# Patient Record
Sex: Female | Born: 1965
Health system: Southern US, Community
[De-identification: ages and names within clinical notes are randomized; demographics above are authoritative.]

## PROBLEM LIST (undated history)

## (undated) DIAGNOSIS — K219 Gastro-esophageal reflux disease without esophagitis: Secondary | ICD-10-CM

## (undated) DIAGNOSIS — R12 Heartburn: Secondary | ICD-10-CM

## (undated) DIAGNOSIS — K589 Irritable bowel syndrome without diarrhea: Secondary | ICD-10-CM

## (undated) DIAGNOSIS — I10 Essential (primary) hypertension: Secondary | ICD-10-CM

## (undated) DIAGNOSIS — Z9889 Other specified postprocedural states: Secondary | ICD-10-CM

## (undated) DIAGNOSIS — R112 Nausea with vomiting, unspecified: Secondary | ICD-10-CM

## (undated) DIAGNOSIS — R7303 Prediabetes: Secondary | ICD-10-CM

## (undated) DIAGNOSIS — E785 Hyperlipidemia, unspecified: Secondary | ICD-10-CM

## (undated) DIAGNOSIS — L719 Rosacea, unspecified: Secondary | ICD-10-CM

## (undated) DIAGNOSIS — M069 Rheumatoid arthritis, unspecified: Secondary | ICD-10-CM

## (undated) DIAGNOSIS — K834 Spasm of sphincter of Oddi: Secondary | ICD-10-CM

## (undated) DIAGNOSIS — M3500 Sicca syndrome, unspecified: Secondary | ICD-10-CM

## (undated) HISTORY — DX: Sjogren syndrome, unspecified: M35.00

## (undated) HISTORY — DX: Rosacea, unspecified: L71.9

## (undated) HISTORY — PX: KNEE ARTHROSCOPY: SHX127

## (undated) HISTORY — DX: Essential (primary) hypertension: I10

## (undated) HISTORY — DX: Hyperlipidemia, unspecified: E78.5

## (undated) HISTORY — DX: Prediabetes: R73.03

## (undated) HISTORY — DX: Irritable bowel syndrome, unspecified: K58.9

## (undated) HISTORY — DX: Gastro-esophageal reflux disease without esophagitis: K21.9

---

## 1993-09-27 HISTORY — PX: CHOLECYSTECTOMY: SHX55

## 2004-05-27 ENCOUNTER — Emergency Department (HOSPITAL_COMMUNITY): Admission: EM | Admit: 2004-05-27 | Discharge: 2004-05-28 | Payer: Self-pay | Admitting: Emergency Medicine

## 2011-09-07 ENCOUNTER — Ambulatory Visit: Payer: BC Managed Care – PPO | Attending: Orthopedic Surgery | Admitting: Physical Therapy

## 2011-09-07 DIAGNOSIS — IMO0001 Reserved for inherently not codable concepts without codable children: Secondary | ICD-10-CM | POA: Insufficient documentation

## 2011-09-07 DIAGNOSIS — M6281 Muscle weakness (generalized): Secondary | ICD-10-CM | POA: Insufficient documentation

## 2011-09-07 DIAGNOSIS — M25579 Pain in unspecified ankle and joints of unspecified foot: Secondary | ICD-10-CM | POA: Insufficient documentation

## 2011-09-07 DIAGNOSIS — R269 Unspecified abnormalities of gait and mobility: Secondary | ICD-10-CM | POA: Insufficient documentation

## 2011-09-09 ENCOUNTER — Ambulatory Visit: Payer: BC Managed Care – PPO | Admitting: Physical Therapy

## 2011-09-13 ENCOUNTER — Ambulatory Visit: Payer: BC Managed Care – PPO | Admitting: Physical Therapy

## 2011-09-15 ENCOUNTER — Ambulatory Visit: Payer: BC Managed Care – PPO | Admitting: Physical Therapy

## 2011-09-17 ENCOUNTER — Ambulatory Visit: Payer: BC Managed Care – PPO | Admitting: Physical Therapy

## 2012-05-25 HISTORY — PX: ABDOMINOPLASTY: SUR9

## 2012-08-13 ENCOUNTER — Emergency Department (INDEPENDENT_AMBULATORY_CARE_PROVIDER_SITE_OTHER): Payer: BC Managed Care – PPO

## 2012-08-13 ENCOUNTER — Emergency Department
Admission: EM | Admit: 2012-08-13 | Discharge: 2012-08-13 | Disposition: A | Discharge status: Home / Self Care | Payer: BC Managed Care – PPO | Source: Home / Self Care | Attending: Emergency Medicine | Admitting: Emergency Medicine

## 2012-08-13 ENCOUNTER — Encounter: Payer: Self-pay | Admitting: *Deleted

## 2012-08-13 DIAGNOSIS — R05 Cough: Secondary | ICD-10-CM

## 2012-08-13 DIAGNOSIS — R0989 Other specified symptoms and signs involving the circulatory and respiratory systems: Secondary | ICD-10-CM

## 2012-08-13 DIAGNOSIS — R059 Cough, unspecified: Secondary | ICD-10-CM

## 2012-08-13 DIAGNOSIS — J209 Acute bronchitis, unspecified: Secondary | ICD-10-CM

## 2012-08-13 HISTORY — DX: Heartburn: R12

## 2012-08-13 LAB — POCT INFLUENZA A/B
Influenza A, POC: NEGATIVE
Influenza B, POC: NEGATIVE

## 2012-08-13 MED ORDER — PROMETHAZINE-CODEINE 6.25-10 MG/5ML PO SYRP: ORAL_SOLUTION | ORAL | Status: DC

## 2012-08-13 MED ORDER — AZITHROMYCIN 250 MG PO TABS: ORAL_TABLET | ORAL | Status: DC

## 2012-08-13 NOTE — ED Notes (Signed)
Patient c/o chest congestion, cough, fever, chills, body ache, HA, wheezing, chest pain, and tired with exertion since Friday. Has tried OTC Nyquil.

## 2012-08-13 NOTE — ED Provider Notes (Signed)
History     CSN: [id number]  Arrival date & time 08/13/12  1550   First MD Initiated Contact with Patient 08/13/12 1552      Chief Complaint  Patient presents with  . Nasal Congestion    chest  . Cough    (Consider location/radiation/quality/duration/timing/severity/associated sxs/prior treatment) Patient is a 46 y.o. female presenting with cough. The history is provided by the patient and the spouse.  Cough This is a new problem. The current episode started 2 days ago. The problem occurs every few minutes. The problem has been gradually worsening. The cough is productive of purulent sputum. The maximum temperature recorded prior to her arrival was 100 to 100.9 F. The fever has been present for 1 to 2 days. Associated symptoms include chest pain (Mildly pleuritic right and left lateral chest discomfort. No exertional chest pain), chills, headaches (Mild), rhinorrhea (had clear rhinorrhea a few days ago but that resolved), myalgias and wheezing (Minimal). Pertinent negatives include no sweats, no weight loss, no ear congestion, no ear pain, no sore throat and no shortness of breath. She has tried nothing for the symptoms. The treatment provided no relief. Risk factors: Works night shift as Charity fundraiser. She is not a smoker. Her past medical history is significant for pneumonia (Had pneumonia once about 3 years ago that resolved with antibiotics). Her past medical history does not include bronchitis, bronchiectasis, COPD, emphysema or asthma.    Past Medical History  Diagnosis Date  . Heartburn     Past Surgical History  Procedure Date  . Cesarean section   . Cholecystectomy   . Abdominoplasty   . Knee arthroscopy     Family History  Problem Relation Age of Onset  . Hypertension Mother   . Hypertension Father     History  Substance Use Topics  . Smoking status: Never Smoker   . Smokeless tobacco: Not on file  . Alcohol Use: No    OB History    Grav Para Term Preterm Abortions  TAB SAB Ect Mult Living                  Review of Systems  Constitutional: Positive for chills and fatigue. Negative for weight loss.  HENT: Positive for rhinorrhea (had clear rhinorrhea a few days ago but that resolved). Negative for ear pain and sore throat.   Respiratory: Positive for cough and wheezing (Minimal). Negative for chest tightness and shortness of breath.   Cardiovascular: Positive for chest pain (Mildly pleuritic right and left lateral chest discomfort. No exertional chest pain). Negative for palpitations and leg swelling.  Gastrointestinal: Negative.   Musculoskeletal: Positive for myalgias. Negative for arthralgias.  Skin: Negative for rash.  Neurological: Positive for headaches (Mild). Negative for syncope and light-headedness.  Psychiatric/Behavioral: Negative for hallucinations and confusion.  All other systems reviewed and are negative.    Allergies  Review of patient's allergies indicates no known allergies.  Home Medications   Current Outpatient Rx  Name  Route  Sig  Dispense  Refill  . CITALOPRAM HYDROBROMIDE 20 MG PO TABS   Oral   Take 20 mg by mouth daily.         Marland Kitchen ESOMEPRAZOLE MAGNESIUM 40 MG PO CPDR   Oral   Take 40 mg by mouth daily before breakfast.         . AZITHROMYCIN 250 MG PO TABS      Use as directed   1 each   0   . PROMETHAZINE-CODEINE 6.25-10 MG/5ML  PO SYRP      Take 1-2 teaspoons every 4-6 hours as needed for cough. May cause drowsiness.   120 mL   0     BP 132/83  Pulse 83  Temp 98.9 F (37.2 C) (Oral)  Resp 20  Ht 5' (1.524 m)  Wt 180 lb 4 oz (81.761 kg)  BMI 35.20 kg/m2  SpO2 95%  Physical Exam  Nursing note and vitals reviewed. Constitutional: She is oriented to person, place, and time. She appears well-developed and well-nourished. No distress.       Alert and cooperative, but appears very fatigued. Hacking cough noted.  HENT:  Head: Normocephalic and atraumatic.  Right Ear: Tympanic membrane normal.    Left Ear: Tympanic membrane normal.  Nose: Nose normal.  Mouth/Throat: Oropharynx is clear and moist. No oropharyngeal exudate.  Eyes: Right eye exhibits no discharge. Left eye exhibits no discharge. No scleral icterus.  Neck: Neck supple.  Cardiovascular: Normal rate, regular rhythm and normal heart sounds.   No murmur heard. Pulmonary/Chest: No respiratory distress. She has no decreased breath sounds. She has no wheezes. She has rhonchi in the right upper field, the right middle field, the left upper field and the left middle field. She has no rales.  Lymphadenopathy:    She has no cervical adenopathy.  Neurological: She is alert and oriented to person, place, and time.  Skin: Skin is warm and dry.   no rash or lesions .  ED Course  Procedures (including critical care time) Chest x-ray ordered at 4:30 PM  Labs Reviewed  POCT INFLUENZA A/B   Dg Chest 2 View  08/13/2012  *RADIOLOGY REPORT*  Clinical Data: Congestion.  Cough.  CHEST - 2 VIEW  Comparison: None.  Findings:  Cardiopericardial silhouette within normal limits. Mediastinal contours normal. Trachea midline.  No airspace disease or effusion. Cholecystectomy clips are present in the right upper quadrant.  IMPRESSION: Negative two-view chest.   Original Report Authenticated By: Andreas Newport, M.D.      1. Bronchitis, acute       MDM  Reviewed the chest x-ray within normal limits.-Diagnosis acute bronchitis. Reviewed flu tests negative.  Treat with Zithromax, Phenergan with codeine cough syrup. Rest and plenty of fluids and other symptomatic care discussed. Questions invited and answered. Followup with PCP if no better in one week, sooner if worse or new symptoms. Work excuse provided. Patient and husband agree with above plans.        Lajean Manes, MD 08/13/12 [phone]

## 2013-05-22 ENCOUNTER — Encounter (HOSPITAL_BASED_OUTPATIENT_CLINIC_OR_DEPARTMENT_OTHER)
Admission: RE | Disposition: A | Discharge status: Home / Self Care | Payer: Self-pay | Source: Ambulatory Visit | Attending: Orthopedic Surgery

## 2013-05-22 ENCOUNTER — Encounter (HOSPITAL_BASED_OUTPATIENT_CLINIC_OR_DEPARTMENT_OTHER): Payer: Self-pay | Admitting: Orthopedic Surgery

## 2013-05-22 ENCOUNTER — Ambulatory Visit (HOSPITAL_BASED_OUTPATIENT_CLINIC_OR_DEPARTMENT_OTHER)
Admission: RE | Admit: 2013-05-22 | Discharge: 2013-05-22 | Disposition: A | Discharge status: Home / Self Care | Payer: BC Managed Care – PPO | Source: Ambulatory Visit | Attending: Orthopedic Surgery | Admitting: Orthopedic Surgery

## 2013-05-22 DIAGNOSIS — S61209A Unspecified open wound of unspecified finger without damage to nail, initial encounter: Secondary | ICD-10-CM | POA: Insufficient documentation

## 2013-05-22 DIAGNOSIS — Y92009 Unspecified place in unspecified non-institutional (private) residence as the place of occurrence of the external cause: Secondary | ICD-10-CM | POA: Insufficient documentation

## 2013-05-22 DIAGNOSIS — X58XXXA Exposure to other specified factors, initial encounter: Secondary | ICD-10-CM | POA: Insufficient documentation

## 2013-05-22 DIAGNOSIS — Z189 Retained foreign body fragments, unspecified material: Secondary | ICD-10-CM | POA: Insufficient documentation

## 2013-05-22 HISTORY — PX: IRRIGATION AND DEBRIDEMENT ABSCESS: SHX5252

## 2013-05-22 SURGERY — MINOR INCISION AND DRAINAGE OF ABSCESS
Anesthesia: LOCAL | Laterality: Right | Wound class: Contaminated

## 2013-05-22 MED ORDER — BUPIVACAINE HCL (PF) 0.25 % IJ SOLN
INTRAMUSCULAR | Status: DC | PRN
  Administered 2013-05-22: 5 mL

## 2013-05-22 MED ORDER — LIDOCAINE HCL (PF) 1 % IJ SOLN
INTRAMUSCULAR | Status: DC | PRN
  Administered 2013-05-22: 5 mL

## 2013-05-22 MED ORDER — CHLORHEXIDINE GLUCONATE 4 % EX LIQD: 60.0000 mL | Freq: Once | CUTANEOUS | Status: DC

## 2013-05-22 MED ORDER — SULFAMETHOXAZOLE-TRIMETHOPRIM 400-80 MG PO TABS: 1.0000 | ORAL_TABLET | Freq: Two times a day (BID) | ORAL | Status: DC

## 2013-05-22 SURGICAL SUPPLY — 44 items
BANDAGE COBAN STERILE 2 (GAUZE/BANDAGES/DRESSINGS) IMPLANT
BANDAGE CONFORM 2  STR LF (GAUZE/BANDAGES/DRESSINGS) IMPLANT
BLADE SURG 15 STRL LF DISP TIS (BLADE) ×1 IMPLANT
BLADE SURG 15 STRL SS (BLADE) ×2
BNDG CMPR 9X4 STRL LF SNTH (GAUZE/BANDAGES/DRESSINGS)
BNDG CMPR MD 5X2 ELC HKLP STRL (GAUZE/BANDAGES/DRESSINGS)
BNDG COHESIVE 1X5 TAN STRL LF (GAUZE/BANDAGES/DRESSINGS) ×1 IMPLANT
BNDG ELASTIC 2 VLCR STRL LF (GAUZE/BANDAGES/DRESSINGS) IMPLANT
BNDG ESMARK 4X9 LF (GAUZE/BANDAGES/DRESSINGS) IMPLANT
CHLORAPREP W/TINT 26ML (MISCELLANEOUS) ×2 IMPLANT
CLOTH BEACON ORANGE TIMEOUT ST (SAFETY) ×2 IMPLANT
CORDS BIPOLAR (ELECTRODE) IMPLANT
COVER MAYO STAND STRL (DRAPES) ×2 IMPLANT
COVER TABLE BACK 60X90 (DRAPES) IMPLANT
CUFF TOURNIQUET SINGLE 18IN (TOURNIQUET CUFF) ×2 IMPLANT
DRAIN PENROSE 1/2X12 LTX STRL (WOUND CARE) IMPLANT
DRAIN PENROSE 1/4X12 LTX STRL (WOUND CARE) ×1 IMPLANT
DRAPE EXTREMITY T 121X128X90 (DRAPE) IMPLANT
DRAPE SURG 17X23 STRL (DRAPES) ×2 IMPLANT
GAUZE PACKING IODOFORM 1/4X5 (PACKING) IMPLANT
GAUZE XEROFORM 1X8 LF (GAUZE/BANDAGES/DRESSINGS) ×2 IMPLANT
GLOVE BIO SURGEON STRL SZ7.5 (GLOVE) ×2 IMPLANT
GLOVE BIOGEL PI IND STRL 8 (GLOVE) ×1 IMPLANT
GLOVE BIOGEL PI IND STRL 8.5 (GLOVE) IMPLANT
GLOVE BIOGEL PI INDICATOR 8 (GLOVE) ×1
GLOVE BIOGEL PI INDICATOR 8.5 (GLOVE) ×1
GLOVE SURG ORTHO 8.0 STRL STRW (GLOVE) ×1 IMPLANT
GOWN BRE IMP PREV XXLGXLNG (GOWN DISPOSABLE) IMPLANT
GOWN PREVENTION PLUS XLARGE (GOWN DISPOSABLE) IMPLANT
NDL HYPO 25X1 1.5 SAFETY (NEEDLE) ×1 IMPLANT
NEEDLE HYPO 25X1 1.5 SAFETY (NEEDLE) IMPLANT
NS IRRIG 1000ML POUR BTL (IV SOLUTION) ×1 IMPLANT
PACK BASIN DAY SURGERY FS (CUSTOM PROCEDURE TRAY) ×1 IMPLANT
PADDING CAST ABS 4INX4YD NS (CAST SUPPLIES) ×1
PADDING CAST ABS COTTON 4X4 ST (CAST SUPPLIES) ×1 IMPLANT
SPONGE GAUZE 4X4 12PLY (GAUZE/BANDAGES/DRESSINGS) ×2 IMPLANT
STOCKINETTE 4X48 STRL (DRAPES) ×2 IMPLANT
SUT ETHILON 4 0 PS 2 18 (SUTURE) IMPLANT
SWAB CULTURE LIQ STUART DBL (MISCELLANEOUS) IMPLANT
SYR BULB 3OZ (MISCELLANEOUS) IMPLANT
SYR CONTROL 10ML LL (SYRINGE) ×2 IMPLANT
TOWEL OR 17X24 6PK STRL BLUE (TOWEL DISPOSABLE) ×4 IMPLANT
TUBE ANAEROBIC SPECIMEN COL (MISCELLANEOUS) IMPLANT
UNDERPAD 30X30 INCONTINENT (UNDERPADS AND DIAPERS) ×2 IMPLANT

## 2013-05-22 NOTE — H&P (Signed)
  Kristine Stevens is a 47 yo female who suffered puncture wound to right thumb. She had had progressive swelling and pain with erythema of the thumb.Possible retained foreign body. PMH: allergy: none  Meds: statin.nexium celexa  Surgery: abdominoplasty FH: non contributory SH: Smoke: none  ETOH: none ROS:neg Kristine Stevens is an 47 y.o. female.   Chief Complaint: Swelling rt thumb  HPI: See above  Past Medical History  Diagnosis Date  . Heartburn     Past Surgical History  Procedure Laterality Date  . Cesarean section    . Cholecystectomy    . Abdominoplasty    . Knee arthroscopy      Family History  Problem Relation Age of Onset  . Hypertension Mother   . Hypertension Father    Social History:  reports that she has never smoked. She does not have any smokeless tobacco history on file. She reports that she does not drink alcohol or use illicit drugs.  Allergies: No Known Allergies  Medications Prior to Admission  Medication Sig Dispense Refill  . citalopram (CELEXA) 20 MG tablet Take 20 mg by mouth daily.      Marland Kitchen esomeprazole (NEXIUM) 40 MG capsule Take 40 mg by mouth daily before breakfast.      . azithromycin (ZITHROMAX Z-PAK) 250 MG tablet Use as directed  1 each  0  . promethazine-codeine (PHENERGAN WITH CODEINE) 6.25-10 MG/5ML syrup Take 1-2 teaspoons every 4-6 hours as needed for cough. May cause drowsiness.  120 mL  0    No results found for this or any previous visit (from the past 48 hour(s)).  No results found.   Pertinent items are noted in HPI.  There were no vitals taken for this visit.  General appearance: alert, cooperative and appears stated age Head: Normocephalic, without obvious abnormality Neck: no JVD Resp: clear to auscultation bilaterally Cardio: regular rate and rhythm, S1, S2 normal, no murmur, click, rub or gallop GI: soft, non-tender; bowel sounds normal; no masses,  no organomegaly Extremities: extremities normal, atraumatic, no cyanosis or  edema Pulses: 2+ and symmetric Skin: Skin color, texture, turgor normal. No rashes or lesions Neurologic: Grossly normal Incision/Wound: Puncture rt thumb  Assessment/Plan Plan: I&D rt thumb  Sherley Mckenney R 05/22/2013, 2:43 PM

## 2013-05-22 NOTE — Op Note (Signed)
Dictation Number [id number]

## 2013-05-22 NOTE — Brief Op Note (Signed)
05/22/2013  3:13 PM  PATIENT:  Kristine Stevens  47 y.o. female  PRE-OPERATIVE DIAGNOSIS:  splinter right thumb   POST-OPERATIVE DIAGNOSIS:  Splinter right thumb  PROCEDURE:  Procedure(s): MINOR INCISION AND DRAINAGE OF ABSCESS (Right)  SURGEON:  Surgeon(s) and Role:    * Tami Ribas, MD - Assisting    * Nicki Reaper, MD - Primary  PHYSICIAN ASSISTANT:   ASSISTANTS: K Lasandra Batley,MD   ANESTHESIA:   local  EBL:     BLOOD ADMINISTERED:none  DRAINS: none   LOCAL MEDICATIONS USED:  MARCAINE    and XYLOCAINE   SPECIMEN:  No Specimen  DISPOSITION OF SPECIMEN:  N/A  COUNTS:  YES  TOURNIQUET:    DICTATION: .Other Dictation: Dictation Number [id number]  PLAN OF CARE: Discharge to home after PACU  PATIENT DISPOSITION:  PACU - hemodynamically stable.

## 2013-05-23 ENCOUNTER — Encounter (HOSPITAL_BASED_OUTPATIENT_CLINIC_OR_DEPARTMENT_OTHER): Payer: Self-pay | Admitting: Orthopedic Surgery

## 2013-05-23 NOTE — Op Note (Signed)
NAMEANAYIA, EUGENE NO.:  [id number]  MEDICAL RECORD NO.:  [id number]  LOCATION:                                 FACILITY:  PHYSICIAN:  Cindee Salt, M.D.            DATE OF BIRTH:  DATE OF PROCEDURE:  05/22/2013 DATE OF DISCHARGE:                              OPERATIVE REPORT   PREOPERATIVE DIAGNOSIS:  Foreign body, right thumb.  POSTOPERATIVE DIAGNOSIS:  Foreign body, right thumb.  OPERATION:  Excision of foreign body, right thumb.  SURGEON:  Cindee Salt, M.D.  ASSISTANT:  Betha Loa, MD  ANESTHESIA:  Metacarpal block with 0.25% Marcaine and 1% Xylocaine, both without epinephrine, 8 mL.  HISTORY:  The patient is a 47 year old female who suffered an injury while working in her garden to the IP joint area of her right thumb. She has had progressive pain and swelling.  She is admitted for excision of probable retained foreign body.  Pre, peri, and postoperative course have been discussed, she is aware that there is no guarantee with the surgery; possibility of further infection.  DESCRIPTION OF PROCEDURE:  The patient was brought to the operating room where a metacarpal block was given with 0.25% Marcaine without epinephrine.  After a time-out taken, confirming patient and procedure, a Penrose drain was used for tourniquet at the base of the finger.  An incision was made, foreign body immediately encountered.  This was on the ulnar aspect of the IP joint.  The incision cross the IP area, the foreign body was removed.  This was then irrigated and packed.  A sterile compressive dressing was applied.  The tourniquet was removed. The patient tolerated the procedure well.  She will be discharged to home to return in 1 week on Nucynta and Septra.          ______________________________ Cindee Salt, M.D.     GK/MEDQ  D:  05/22/2013  T:  05/23/2013  Job:  [id number]

## 2013-08-16 DIAGNOSIS — K834 Spasm of sphincter of Oddi: Secondary | ICD-10-CM | POA: Insufficient documentation

## 2013-12-01 ENCOUNTER — Encounter: Payer: 59 | Attending: Physician Assistant | Admitting: Dietician

## 2013-12-01 DIAGNOSIS — E663 Overweight: Secondary | ICD-10-CM

## 2013-12-01 DIAGNOSIS — E119 Type 2 diabetes mellitus without complications: Secondary | ICD-10-CM | POA: Insufficient documentation

## 2013-12-01 DIAGNOSIS — Z713 Dietary counseling and surveillance: Secondary | ICD-10-CM | POA: Insufficient documentation

## 2013-12-03 ENCOUNTER — Encounter: Payer: Self-pay | Admitting: Dietician

## 2013-12-03 NOTE — Progress Notes (Signed)
   HPI Review of Systems     Physical Exam        Weight Management Class:  Appt start time: 1000   End time:  1100.  Patient was seen on 12/01/2013 for [facility] Management Education [facility].   Weight today: unknown   The following the learning objectives were met by the patient during this course:  Identify healthy eating/lifestyle behaviors for weight loss  Be familiar with the different food groups and appropriate serving sizes  Learn healthy meal planning  State the appropriate amount of weight to lose weekly  Identify 1-2 goals to begin to lose weight   Follow-Up Plan: Patient to set specific, measurable, attainable, realistic, and time-oriented goals for weight loss.      

## 2013-12-03 NOTE — Patient Instructions (Signed)
Patient to set specific, measurable, attainable, realistic, and time-oriented goals.  

## 2014-03-28 ENCOUNTER — Other Ambulatory Visit (HOSPITAL_COMMUNITY): Payer: Self-pay | Admitting: Orthopaedic Surgery

## 2014-03-28 DIAGNOSIS — M25562 Pain in left knee: Secondary | ICD-10-CM

## 2014-09-28 ENCOUNTER — Emergency Department (INDEPENDENT_AMBULATORY_CARE_PROVIDER_SITE_OTHER)
Admission: EM | Admit: 2014-09-28 | Discharge: 2014-09-28 | Disposition: A | Discharge status: Home / Self Care | Payer: 59 | Source: Home / Self Care | Attending: Family Medicine | Admitting: Family Medicine

## 2014-09-28 DIAGNOSIS — H938X2 Other specified disorders of left ear: Secondary | ICD-10-CM

## 2014-09-28 MED ORDER — AMOXICILLIN 875 MG PO TABS: 875.0000 mg | ORAL_TABLET | Freq: Two times a day (BID) | ORAL | Status: DC

## 2014-09-28 NOTE — ED Provider Notes (Signed)
CSN: [id number]     Arrival date & time 09/28/14  1552 History   First MD Initiated Contact with Patient 09/28/14 1633     Chief Complaint  Patient presents with  . Ear Fullness  . Dizziness      HPI Comments: About 12 days ago patient developed sinus congestion and rhinorrhea.  She then developed a sensation of fullness and decreased hearing in her left ear.  Today she felt dizzy without nausea.  No fevers, chills, and sweats.  The history is provided by the patient.    Past Medical History  Diagnosis Date  . Heartburn    Past Surgical History  Procedure Laterality Date  . Cesarean section    . Cholecystectomy    . Abdominoplasty    . Knee arthroscopy    . Irrigation and debridement abscess Right 05/22/2013    Procedure: MINOR INCISION AND DRAINAGE OF ABSCESS;  Surgeon: Nicki Reaper, MD;  Location: [facility];  Service: Orthopedics;  Laterality: Right;   Family History  Problem Relation Age of Onset  . Hypertension Mother   . Hyperlipidemia Mother   . Fibromyalgia Mother   . Hypertension Father    History  Substance Use Topics  . Smoking status: Never Smoker   . Smokeless tobacco: Never Used  . Alcohol Use: No   OB History    No data available     Review of Systems No sore throat No cough No pleuritic pain No wheezing + nasal congestion ? post-nasal drainage No sinus pain/pressure No itchy/red eyes ? Earache + dizziness No hemoptysis No SOB No fever/chills No nausea No vomiting No abdominal pain No diarrhea No urinary symptoms No skin rash No fatigue No myalgias No headache Used OTC meds without relief  Allergies  Review of patient's allergies indicates no known allergies.  Home Medications   Prior to Admission medications   Medication Sig Start Date End Date Taking? Authorizing Provider  aspirin 81 MG tablet Take 81 mg by mouth daily.   Yes Historical Provider, MD  atorvastatin (LIPITOR) 40 MG tablet Take 40 mg by mouth daily.    Yes Historical Provider, MD  citalopram (CELEXA) 20 MG tablet Take 20 mg by mouth daily.   Yes Historical Provider, MD  esomeprazole (NEXIUM) 40 MG capsule Take 40 mg by mouth daily before breakfast.   Yes Historical Provider, MD  amoxicillin (AMOXIL) 875 MG tablet Take 1 tablet (875 mg total) by mouth 2 (two) times daily. 09/28/14   Lattie Haw, MD   BP 137/89 mmHg  Pulse 84  Temp(Src) 97.6 F (36.4 C) (Oral)  Ht  (1.549 m)  Wt 175 lb (79.379 kg)  BMI 33.08 kg/m2  SpO2 99% Physical Exam Nursing notes and Vital Signs reviewed. Appearance:  Patient appears stated age, and in no acute distress.  Patient is obese (BMI 33.1) Eyes:  Pupils are equal, round, and reactive to light and accomodation.  Extraocular movement is intact.  Conjunctivae are not inflamed  Ears:  Canals normal.  Tympanic membranes normal.  Nose:   Normal turbinates.  No sinus tenderness.    Pharynx:  Normal Neck:  Supple.   No adenopathy Skin:  No rash present.   ED Course  Procedures  None   Labs Reviewed -  Tympanogram:  Low peak height both ears.      MDM   1. Sensation of fullness in left ear; ?otitis media    Begin amoxicillin  BID for one week.  May use Afrin nasal spray (or generic oxymetazoline) once daily for about 5 days.  Also recommend using saline nasal spray several times daily and saline nasal irrigation (AYR is a common brand) Followup with ENT in one week.    Lattie Haw, MD 10/04/14 [phone]

## 2014-09-28 NOTE — Discharge Instructions (Signed)
May use Afrin nasal spray (or generic oxymetazoline) once daily for about 5 days.  Also recommend using saline nasal spray several times daily and saline nasal irrigation (AYR is a common brand)

## 2014-09-28 NOTE — ED Notes (Signed)
Kristine Stevens complains of ear fullness, change in hearing, congestion and runny nose for 1 week. She did have dizziness today.

## 2015-01-02 ENCOUNTER — Other Ambulatory Visit: Payer: Self-pay

## 2015-01-02 VITALS — BP 136/84 | HR 70 | Ht 61.0 in | Wt 195.6 lb

## 2015-01-02 DIAGNOSIS — E785 Hyperlipidemia, unspecified: Secondary | ICD-10-CM | POA: Insufficient documentation

## 2015-01-02 DIAGNOSIS — R7303 Prediabetes: Secondary | ICD-10-CM

## 2015-01-02 NOTE — Patient Instructions (Signed)
1. Plan to meet with RD at [facility].  They will call you to schedule. 2. Plan to eat 30-45gm carbs each meal and 15gm for snacks. 3. Plan to check blood sugar once a week fasting or 1 -2hours after eating with goals of less than 100 fasting and less than 140 after meals. 4. Plan to walk for 30 minutes 4 days a week 5. Plan to meet with bariatric surgeon to discuss surgery options 6. Plan to return to [facility] on 04/17/15 at 9:30AM

## 2015-01-02 NOTE — Patient Outreach (Signed)
[facility] Care Management   01/02/2015  Kristine Stevens 11-Dec-1965 [id number]  Kristine Stevens is an 49 y.o. female.   Member seen for follow up office visit for [facility] program for self management of prediabetes  Subjective: Member states that she is still struggling with her weight.  States that she follows a reduced CHO diet but she is not losing weight.  States that she has turned in the packet for weight loss surgery and she needs to call to schedule an appointment with the surgeon.  States she has joined [employer] but she has only gone to a few classes.  States she has not been checking her blood sugars as she lost her glucometer.    Objective:   Review of Systems  All other systems reviewed and are negative.   Physical Exam  Filed Vitals:   01/02/15 0935  BP: 136/84  Pulse: 70   Filed Weights   01/02/15 0935  Weight: 195 lb 9.6 oz (88.724 kg)    Current Medications:   Current Outpatient Prescriptions  Medication Sig Dispense Refill  . aspirin 81 MG tablet Take 81 mg by mouth daily.    Marland Kitchen. atorvastatin (LIPITOR) 40 MG tablet Take 40 mg by mouth daily.    . citalopram (CELEXA) 20 MG tablet Take 20 mg by mouth daily.    Marland Kitchen. esomeprazole (NEXIUM) 40 MG capsule Take 40 mg by mouth daily before breakfast.    . triamterene-hydrochlorothiazide (MAXZIDE-25) 37.5-25 MG per tablet Take 1 tablet by mouth daily.    Marland Kitchen. amoxicillin (AMOXIL) 875 MG tablet Take 1 tablet (875 mg total) by mouth 2 (two) times daily. (Patient not taking: Reported on 01/02/2015) 14 tablet 0   No current facility-administered medications for this visit.    Functional Status:   In your present state of health, do you have any difficulty performing the following activities: 01/02/2015  Is the patient deaf or have difficulty hearing? N  Hearing N  Vision N  Difficulty concentrating or making decisions N  Walking or climbing stairs? N  Doing errands, shopping? N    Fall/Depression  Screening:    PHQ 2/9 Scores 01/02/2015  PHQ - 2 Score 0   [facility] CM Care Plan        Patient Outreach from 01/02/2015 in [facility]   Care Plan Problem One  Potential for elevated blood sugars related to dx of prediabetes   Care Plan for Problem One  Active   Interventions for Problem One Long Term Goal  Reviewed CHO counting and portion control, Encouraged to continue to go to [employer] meetings, Instructed to make appt with bariatic surgeon, Issued True Resultt glucometer and reviewed use , Reinforced importance of  regular exercise for glycemic control   [facility] Long Term Goal (31-90 days)  Member will maintain hemoglobin A1C below 6.5 for the next 90 days   [facility] Long Term Goal Start Date  01/02/15       Assessment:  Member is maintaining her hemoglobin A1C at 6.0.  She continues to struggle with weight loss even with [employer] Wat[employer] membership. She has not been checking her blood sugars.  Plan: Member to return to [facility] on 04/17/15. Dudley MajorMelissa Sandlin RN, [facility],CCM [facility] [phone]

## 2015-01-23 ENCOUNTER — Encounter: Payer: 59 | Attending: Physician Assistant | Admitting: Dietician

## 2015-01-23 ENCOUNTER — Encounter: Payer: Self-pay | Admitting: Dietician

## 2015-01-23 DIAGNOSIS — Z6837 Body mass index (BMI) 37.0-37.9, adult: Secondary | ICD-10-CM | POA: Insufficient documentation

## 2015-01-23 DIAGNOSIS — Z713 Dietary counseling and surveillance: Secondary | ICD-10-CM | POA: Diagnosis not present

## 2015-01-23 NOTE — Progress Notes (Signed)
  Medical Nutrition Therapy:  Appt start time: 1120 end time:  1215.   Assessment:  Primary concerns today: Kristine Stevens reports that she is here today to discuss her weight. 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 Her sleeping and eating schedule is erratic. Kristine Stevens reports a lack of energy. She has been frustrated with her weight for many years. Has tried keeping a food log but forgets to record foods. She follows the "slow carb diet" but has not been as consistent lately. Has also tried Weight Watchers. Kristine Stevens reports that she has been considering bariatric surgery. She has watched the online seminar and submitted paperwork to pursue surgery.  Preferred Learning Style:   No preference indicated   Learning Readiness:   Ready   MEDICATIONS: see list   DIETARY INTAKE:    24-hr recall:   D (PM): fried fish and baked potato Work at 9pm 1-2 AM: [employer] salad, chicken, beans, and boiled  7AM: Greek yogurt or egg 10 AM: tries not to eat much before going back to sleep   Beverages: cherry coke zero, water, unsweet tea sweetened with sweetener  Usual physical activity: has a [employer] membership but does not go  Estimated energy needs: 1600-1800 calories  Progress Towards Goal(s):  In progress.   Nutritional Diagnosis:  [place]-3.3 Overweight/obesity As related to history of dieting and erratic eating and sleeping pattern.  As evidenced by patient report.    Intervention:  Nutrition education provided. Answered the patient's nutrition-related questions regarding bariatric surgery.   Teaching Method Utilized:  Auditory  Barriers to learning/adherence to lifestyle change: none  Demonstrated degree of understanding via:  Teach Back   Monitoring/Evaluation:  Dietary intake, exercise, and body weight prn.

## 2015-01-28 LAB — HM MAMMOGRAPHY

## 2015-02-12 ENCOUNTER — Other Ambulatory Visit (INDEPENDENT_AMBULATORY_CARE_PROVIDER_SITE_OTHER): Payer: Self-pay

## 2015-02-12 DIAGNOSIS — Z01818 Encounter for other preprocedural examination: Secondary | ICD-10-CM

## 2015-02-27 ENCOUNTER — Encounter: Payer: Self-pay | Admitting: Dietician

## 2015-02-27 ENCOUNTER — Encounter: Payer: 59 | Attending: Physician Assistant | Admitting: Dietician

## 2015-02-27 DIAGNOSIS — Z6837 Body mass index (BMI) 37.0-37.9, adult: Secondary | ICD-10-CM | POA: Diagnosis not present

## 2015-02-27 DIAGNOSIS — Z713 Dietary counseling and surveillance: Secondary | ICD-10-CM | POA: Insufficient documentation

## 2015-02-27 NOTE — Progress Notes (Signed)
  Pre-Op Assessment Visit:  Pre-Operative Sleeve Gastrectomy Surgery  Medical Nutrition Therapy:  Appt start time: 0355   End time:  450  Patient was seen on 02/27/2015 for Pre-Operative Nutrition Assessment. Assessment and [facility] coordinator on 02/27/2015.   Preferred Learning Style:   No preference indicated   Learning Readiness:   Ready  Handouts given during visit include:  Pre-Op Goals Bariatric Surgery Protein Shakes   During the appointment today the following Pre-Op Goals were reviewed with the patient: Maintain or lose weight as instructed by your surgeon Make healthy food choices Begin to limit portion sizes Limited concentrated sugars and fried foods Keep fat/sugar in the single digits per serving on   food labels Practice CHEWING your food  (aim for 30 chews per bite or until applesauce consistency) Practice not drinking 15 minutes before, during, and 30 minutes after each meal/snack Avoid all carbonated beverages  Avoid/limit caffeinated beverages  Avoid all sugar-sweetened beverages Consume 3 meals per day; eat every 3-5 hours Make a list of non-food related activities Aim for 64-100 ounces of FLUID daily  Aim for at least 60-80 grams of PROTEIN daily Look for a liquid protein source that contain ?15 g protein and ?5 g carbohydrate  (ex: shakes, drinks, shots)  Patient-Centered Goals: -Self esteem -Able to wear nice clothes -Being proud of pictures  Scale of 1-10: confidence (10) /importance (9)  Demonstrated degree of understanding via:  Teach Back  Teaching Method Utilized:  Visual Auditory Hands on  Barriers to learning/adherence to lifestyle change: none  Patient to call the [facility] to enroll in Pre-Op and Post-Op Nutrition Education when surgery date is scheduled.

## 2015-03-03 NOTE — Progress Notes (Signed)
  Pre-Operative Nutrition Class:  Appt start time: 830   End time:  930.  Patient was seen on 03/03/15 for Pre-Operative Bariatric Surgery Education at [facility].   Surgery date:  Surgery type: gastric sleeve Start weight at [facility]: 192 lbs Weight today: 194.5 lbs  TANITA  BODY COMP RESULTS  03/03/15   BMI (kg/m^2) 37.4   Fat Mass (lbs) 91   Fat Free Mass (lbs) 103.5   Total Body Water (lbs) 76   Samples given per MNT protocol. Patient educated on appropriate usage: Premier protein shake (vanilla - qty 1) Lot #: [id number] Exp: 09/2015  Unjury protein powder (unflavored - qty 1) Lot #: [id number] Exp: 03/2016  Celebrate Calcium citrate chew (caramel - qty 1) Lot #: [id number] Exp: 11/2016  PB2 (chocolate - qty 1) Lot #: [id number] Exp: 07/2015   The following the learning objectives were met by the patient during this course:  Identify Pre-Op Dietary Goals and will begin 2 weeks pre-operatively  Identify appropriate sources of fluids and proteins   State protein recommendations and appropriate sources pre and post-operatively  Identify Post-Operative Dietary Goals and will follow for 2 weeks post-operatively  Identify appropriate multivitamin and calcium sources  Describe the need for physical activity post-operatively and will follow MD recommendations  State when to call healthcare provider regarding medication questions or post-operative complications  Handouts given during class include:  Pre-Op Bariatric Surgery Diet Handout  Protein Shake Handout  Post-Op Bariatric Surgery Nutrition Handout  BELT Program Information Flyer  Support Group Information Flyer  [facility] Bariatric Supplements Price List  Follow-Up Plan: Patient will follow-up at [facility] 2 weeks post operatively for diet advancement per MD.

## 2015-03-13 ENCOUNTER — Other Ambulatory Visit: Payer: Self-pay

## 2015-03-13 ENCOUNTER — Ambulatory Visit (HOSPITAL_COMMUNITY)
Admission: RE | Admit: 2015-03-13 | Discharge: 2015-03-13 | Disposition: A | Discharge status: Home / Self Care | Payer: 59 | Source: Ambulatory Visit | Attending: Surgery | Admitting: Surgery

## 2015-03-13 ENCOUNTER — Other Ambulatory Visit (HOSPITAL_COMMUNITY): Payer: Self-pay | Admitting: Surgery

## 2015-03-13 DIAGNOSIS — Z01811 Encounter for preprocedural respiratory examination: Secondary | ICD-10-CM | POA: Insufficient documentation

## 2015-03-13 DIAGNOSIS — Z01810 Encounter for preprocedural cardiovascular examination: Secondary | ICD-10-CM | POA: Diagnosis not present

## 2015-03-13 DIAGNOSIS — K219 Gastro-esophageal reflux disease without esophagitis: Secondary | ICD-10-CM | POA: Insufficient documentation

## 2015-03-13 DIAGNOSIS — K449 Diaphragmatic hernia without obstruction or gangrene: Secondary | ICD-10-CM | POA: Insufficient documentation

## 2015-03-19 NOTE — Progress Notes (Signed)
Please put orders in Epic surgery 03-25-15 pre op 03-24-15 Thanks

## 2015-03-21 ENCOUNTER — Ambulatory Visit (HOSPITAL_COMMUNITY): Payer: 59

## 2015-03-21 ENCOUNTER — Ambulatory Visit: Payer: Self-pay | Admitting: Surgery

## 2015-03-21 NOTE — H&P (Signed)
Chief Complaint:  Obesity and prediabetes  History of Present Illness:  Kristine Stevens is an 49 y.o. female who presents today for sleeve gastrectomy.   She is 5' and is on track to be like her grandmothers who was over 200 lbs.  She has prediabetes, arthritis, hypercholesterolemia and post ERCP pancreatitis.  She has had a prior lap chole.  ERCP however was done for type iii Sphicterof Oddi dysfunction by Achilles D[facility]John Gilliam [facility].  She has been on our on line seminar and is interested in sleeve gastrectomy.  We discussed lapband and roux y briefly.  Will move toward sleeve gastrectomy with hiatus hernia repair since she has symptomatic reflux treated with Nexium.  She has also had a prior abdom[place]    UGI showed a small hiatal hernia with mild gastroesophageal reflux.  She has had GER for 10 years and takes Nexium.  I discussed concomitant HH repair but that she may still have reflux.  She gets nauseated with morphine and percocet.  Does best with Dilaudid.  Prescription given for postop meds.     Past Medical History  Diagnosis Date  . Heartburn   . Prediabetes   . Hyperlipidemia   . GERD (gastroesophageal reflux disease)   . Hypertension     Past Surgical History  Procedure Laterality Date  . Cesarean section    . Cholecystectomy    . Abdominoplasty    . Knee arthroscopy    . Irrigation and debridement abscess Right 05/22/2013    Procedure: MINOR INCISION AND DRAINAGE OF ABSCESS;  Surgeon: Nicki ReaperGary R Kuzma, MD;  Location: [facility];  Service: Orthopedics;  Laterality: Right;    Current Outpatient Prescriptions  Medication Sig Dispense Refill  . amoxicillin (AMOXIL) 875 MG tablet Take 1 tablet (875 mg total) by mouth 2 (two) times daily. (Patient not taking: Reported on 01/02/2015) 14 tablet 0  . aspirin 81 MG tablet Take 81 mg by mouth at bedtime.     Marland Kitchen. atorvastatin (LIPITOR) 40 MG tablet Take 40 mg by mouth every evening.     . citalopram (CELEXA) 20 MG tablet  Take 20 mg by mouth every evening.     Marland Kitchen. esomeprazole (NEXIUM) 40 MG capsule Take 40 mg by mouth every evening.     Marland Kitchen. ibuprofen (ADVIL,MOTRIN) 200 MG tablet Take 400 mg by mouth every 6 (six) hours as needed for headache or moderate pain.    . Pediatric Multiple Vit-C-FA (FLINSTONES GUMMIES OMEGA-3 DHA PO) Take 1 each by mouth every evening.    . triamterene-hydrochlorothiazide (MAXZIDE-25) 37.5-25 MG per tablet Take 1 tablet by mouth every evening.      No current facility-administered medications for this visit.   Review of patient's allergies indicates no known allergies. Family History  Problem Relation Age of Onset  . Hypertension Mother   . Hyperlipidemia Mother   . Fibromyalgia Mother   . Hypertension Father    Social History:   reports that she has never smoked. She has never used smokeless tobacco. She reports that she does not drink alcohol or use illicit drugs.   REVIEW OF SYSTEMS : Negative except for see problem list  Physical Exam:   BMI 36 Gen:  WDWN WF NAD  Neurological: Alert and oriented to person, place, and time. Motor and sensory function is grossly intact  Head: Normocephalic and atraumatic.  Eyes: Conjunctivae are normal. Pupils are equal, round, and reactive to light. No scleral icterus.  Neck: Normal range  of motion. Neck supple. No tracheal deviation or thyromegaly present.  Cardiovascular:  SR without murmurs or gallops.  No carotid bruits Breast:  Not examined Respiratory: Effort normal.  No respiratory distress. No chest wall tenderness. Breath sounds normal.  No wheezes, rales or rhonchi.  Abdomen:  nontender GU:  Not examined Musculoskeletal: Normal range of motion. Extremities are nontender. No cyanosis, edema or clubbing noted Lymphadenopathy: No cervical, preauricular, postauricular or axillary adenopathy is present Skin: Skin is warm and dry. No rash noted. No diaphoresis. No erythema. No pallor. Pscyh: Normal mood and affect. Behavior is normal.  Judgment and thought content normal.   LABORATORY RESULTS: No results found for this or any previous visit (from the past 48 hour(s)).   RADIOLOGY RESULTS: No results found.  Problem List: Patient Active Problem List   Diagnosis Date Noted  . Hyperlipemia 01/02/2015    Assessment & Plan: Morbid obesity with multiple comorbidities for sleeve gastrectomy and repair of hiatus hernia.      Matt B. Daphine Deutscher, MD, [facility] [phone] beeper [phone]  03/21/2015 9:42 AM

## 2015-03-24 ENCOUNTER — Encounter (HOSPITAL_COMMUNITY)
Admission: RE | Admit: 2015-03-24 | Discharge: 2015-03-24 | Disposition: A | Discharge status: Home / Self Care | Payer: 59 | Source: Ambulatory Visit | Attending: Surgery | Admitting: Surgery

## 2015-03-24 ENCOUNTER — Encounter (HOSPITAL_COMMUNITY): Payer: Self-pay

## 2015-03-24 HISTORY — DX: Other specified postprocedural states: Z98.890

## 2015-03-24 HISTORY — DX: Nausea with vomiting, unspecified: R11.2

## 2015-03-24 HISTORY — DX: Spasm of sphincter of Oddi: K83.4

## 2015-03-24 LAB — COMPREHENSIVE METABOLIC PANEL
ALT: 26 U/L (ref 14–54)
AST: 27 U/L (ref 15–41)
Albumin: 3.9 g/dL (ref 3.5–5.0)
Alkaline Phosphatase: 66 U/L (ref 38–126)
Anion gap: 10 (ref 5–15)
BUN: 10 mg/dL (ref 6–20)
CALCIUM: 8.6 mg/dL — AB (ref 8.9–10.3)
CO2: 30 mmol/L (ref 22–32)
CREATININE: 0.72 mg/dL (ref 0.44–1.00)
Chloride: 98 mmol/L — ABNORMAL LOW (ref 101–111)
GFR calc Af Amer: >60 mL/min (ref >60)
Glucose, Bld: 137 mg/dL — ABNORMAL HIGH (ref 65–99)
Potassium: 2.7 mmol/L — CL (ref 3.5–5.1)
SODIUM: 138 mmol/L (ref 135–145)
TOTAL PROTEIN: 7.4 g/dL (ref 6.5–8.1)
Total Bilirubin: 0.4 mg/dL (ref 0.3–1.2)

## 2015-03-24 LAB — CBC WITH DIFFERENTIAL/PLATELET
BASOS ABS: 0 10*3/uL (ref 0.0–0.1)
BASOS PCT: 0 % (ref 0–1)
EOS ABS: 0.2 10*3/uL (ref 0.0–0.7)
Eosinophils Relative: 2 % (ref 0–5)
HEMATOCRIT: 40.8 % (ref 36.0–46.0)
Hemoglobin: 13.2 g/dL (ref 12.0–15.0)
Lymphocytes Relative: 24 % (ref 12–46)
Lymphs Abs: 1.8 10*3/uL (ref 0.7–4.0)
MCH: 29 pg (ref 26.0–34.0)
MCHC: 32.4 g/dL (ref 30.0–36.0)
MCV: 89.7 fL (ref 78.0–100.0)
MONO ABS: 0.4 10*3/uL (ref 0.1–1.0)
Monocytes Relative: 6 % (ref 3–12)
NEUTROS ABS: 5.3 10*3/uL (ref 1.7–7.7)
NEUTROS PCT: 68 % (ref 43–77)
Platelets: 294 10*3/uL (ref 150–400)
RBC: 4.55 MIL/uL (ref 3.87–5.11)
RDW: 13.9 % (ref 11.5–15.5)
WBC: 7.8 10*3/uL (ref 4.0–10.5)

## 2015-03-24 LAB — HCG, SERUM, QUALITATIVE: Preg, Serum: NEGATIVE

## 2015-03-24 NOTE — Patient Instructions (Addendum)
Kristine Stevens  03/24/2015   Your procedure is scheduled on: Tuesday 03/25/15  [facility]  [facility] 3rd floor to  Short Stay Center at 05:15 AM.  Call this number if you have problems the morning of surgery [phone]   Remember: ONLY 1 PERSON MAY GO WITH YOU TO SHORT STAY TO GET  READY MORNING OF YOUR SURGERY.  Do not eat food or drink liquids :After Midnight.                               You may not have any metal on your body including hair pins and              piercings  Do not wear jewelry, make-up, lotions, powders or perfumes, deodorant             Do not wear nail polish.  Do not shave  48 hours prior to surgery.              Men may shave face and neck.  Do not bring valuables to the hospital. [facility] IS NOT             RESPONSIBLE   FOR VALUABLES.  Contacts, dentures or bridgework may not be worn into surgery.  Leave suitcase in the car. After surgery it may be brought to your room.  _____________________________________________________________________           [facility] - Preparing for [facility] Before surgery, you can play an important role.  Because skin is not sterile, your skin needs to be as free of germs as possible.  You can reduce the number of germs on your skin by washing with CHG (chlorahexidine gluconate) soap before surgery.  CHG is an antiseptic cleaner which kills germs and bonds with the skin to continue killing germs even after washing. Please DO NOT use if you have an allergy to CHG or antibacterial soaps.  If your skin becomes reddened/irritated stop using the CHG and inform your nurse when you arrive at Short Stay. Do not shave (including legs and underarms) for at least 48 hours prior to the first CHG shower.  You may shave your face/neck. Please follow these instructions carefully:  1.  Shower with CHG Soap the night before surgery and the  morning of Surgery.  2.  If you choose to wash  your hair, wash your hair first as usual with your  normal  shampoo.  3.  After you shampoo, rinse your hair and body thoroughly to remove the  shampoo.                            4.  Use CHG as you would any other liquid soap.  You can apply chg directly  to the skin and wash                       Gently with a scrungie or clean washcloth.  5.  Apply the CHG Soap to your body ONLY FROM THE NECK DOWN.   Do not use on face/ open                           Wound or open sores. Avoid  contact with eyes, ears mouth and genitals (private parts).                       Wash face,  Genitals (private parts) with your normal soap.             6.  Wash thoroughly, paying special attention to the area where your surgery  will be performed.  7.  Thoroughly rinse your body with warm water from the neck down.  8.  DO NOT shower/wash with your normal soap after using and rinsing off  the CHG Soap.                9.  Pat yourself dry with a clean towel.            10.  Wear clean pajamas.            11.  Place clean sheets on your bed the night of your first shower and do not  sleep with pets. Day of Surgery : Do not apply any lotions/deodorants the morning of surgery.  Please wear clean clothes to the hospital/surgery center.  FAILURE TO FOLLOW THESE INSTRUCTIONS MAY RESULT IN THE CANCELLATION OF YOUR SURGERY PATIENT SIGNATURE_________________________________  NURSE SIGNATURE__________________________________  ________________________________________________________________________

## 2015-03-24 NOTE — Progress Notes (Addendum)
CRITICAL VALUE ALERT  Critical value received:  Potassium 2.7   Date of notification:  03/24/15  Time of notification:  12:10 PM  Critical value read back:Yes.    Nurse who received alert:  R. Lavaris Sexson RN  MD notified (1st page):  Reinaldo MeekerWendy Smith RN with [facility]  Time of first page:  12:15 PM  MD notified (2nd page):  Time of second page:  Responding MD: Reinaldo MeekerWendy Smith RN/Dr. Daphine DeutscherMartin   Time MD responded:  12:40 PM

## 2015-03-24 NOTE — Progress Notes (Signed)
Critical value potassium 2.7 received. Toniann FailWendy with Dr. Ermalene SearingMartin's office called and got in touch with Dr. Daphine DeutscherMartin.  He will order treatment and inform the patient.  We are to recheck potassium level in the morning.

## 2015-03-24 NOTE — Progress Notes (Signed)
Chest x-ray 03/13/15 on EPIC, EKG 03/13/15 on EPIC

## 2015-03-25 ENCOUNTER — Encounter (HOSPITAL_COMMUNITY)
Admission: RE | Disposition: A | Discharge status: Home / Self Care | Payer: Self-pay | Source: Ambulatory Visit | Attending: Surgery

## 2015-03-25 ENCOUNTER — Inpatient Hospital Stay (HOSPITAL_COMMUNITY): Payer: 59 | Admitting: Anesthesiology

## 2015-03-25 ENCOUNTER — Encounter (HOSPITAL_COMMUNITY): Payer: Self-pay | Admitting: *Deleted

## 2015-03-25 ENCOUNTER — Inpatient Hospital Stay (HOSPITAL_COMMUNITY)
Admission: RE | Admit: 2015-03-25 | Discharge: 2015-03-27 | DRG: 621 | Disposition: A | Discharge status: Home / Self Care | Payer: 59 | Source: Ambulatory Visit | Attending: Surgery | Admitting: Surgery

## 2015-03-25 DIAGNOSIS — I10 Essential (primary) hypertension: Secondary | ICD-10-CM | POA: Diagnosis present

## 2015-03-25 DIAGNOSIS — K219 Gastro-esophageal reflux disease without esophagitis: Secondary | ICD-10-CM | POA: Diagnosis present

## 2015-03-25 DIAGNOSIS — M1990 Unspecified osteoarthritis, unspecified site: Secondary | ICD-10-CM | POA: Diagnosis present

## 2015-03-25 DIAGNOSIS — K449 Diaphragmatic hernia without obstruction or gangrene: Secondary | ICD-10-CM | POA: Diagnosis present

## 2015-03-25 DIAGNOSIS — Z7982 Long term (current) use of aspirin: Secondary | ICD-10-CM

## 2015-03-25 DIAGNOSIS — Z6836 Body mass index (BMI) 36.0-36.9, adult: Secondary | ICD-10-CM | POA: Diagnosis not present

## 2015-03-25 DIAGNOSIS — E785 Hyperlipidemia, unspecified: Secondary | ICD-10-CM | POA: Diagnosis present

## 2015-03-25 DIAGNOSIS — Z9049 Acquired absence of other specified parts of digestive tract: Secondary | ICD-10-CM | POA: Diagnosis present

## 2015-03-25 DIAGNOSIS — Z9884 Bariatric surgery status: Secondary | ICD-10-CM

## 2015-03-25 HISTORY — PX: LAPAROSCOPIC GASTRIC SLEEVE RESECTION: SHX5895

## 2015-03-25 LAB — CBC
HCT: 39.1 % (ref 36.0–46.0)
Hemoglobin: 12.8 g/dL (ref 12.0–15.0)
MCH: 30 pg (ref 26.0–34.0)
MCHC: 32.7 g/dL (ref 30.0–36.0)
MCV: 91.8 fL (ref 78.0–100.0)
PLATELETS: 267 10*3/uL (ref 150–400)
RBC: 4.26 MIL/uL (ref 3.87–5.11)
RDW: 14.2 % (ref 11.5–15.5)
WBC: 11.9 10*3/uL — ABNORMAL HIGH (ref 4.0–10.5)

## 2015-03-25 LAB — PREGNANCY, URINE: PREG TEST UR: NEGATIVE

## 2015-03-25 LAB — POTASSIUM: POTASSIUM: 3 mmol/L — AB (ref 3.5–5.1)

## 2015-03-25 LAB — CREATININE, SERUM
Creatinine, Ser: 0.8 mg/dL (ref 0.44–1.00)
GFR calc Af Amer: >60 mL/min (ref >60)

## 2015-03-25 SURGERY — GASTRECTOMY, SLEEVE, LAPAROSCOPIC
Anesthesia: General

## 2015-03-25 MED ORDER — DEXAMETHASONE SODIUM PHOSPHATE 10 MG/ML IJ SOLN
INTRAMUSCULAR | Status: AC
Start: 2015-03-25 — End: 2015-03-25
  Filled 2015-03-25: qty 1

## 2015-03-25 MED ORDER — ONDANSETRON HCL 4 MG/2ML IJ SOLN
INTRAMUSCULAR | Status: AC
  Filled 2015-03-25: qty 2

## 2015-03-25 MED ORDER — GLYCOPYRROLATE 0.2 MG/ML IJ SOLN
INTRAMUSCULAR | Status: AC
  Filled 2015-03-25: qty 3

## 2015-03-25 MED ORDER — UNJURY VANILLA POWDER: 2.0000 [oz_av] | Freq: Four times a day (QID) | ORAL | Status: DC

## 2015-03-25 MED ORDER — ONDANSETRON HCL 4 MG/2ML IJ SOLN
INTRAMUSCULAR | Status: DC | PRN
  Administered 2015-03-25: 4 mg via INTRAVENOUS

## 2015-03-25 MED ORDER — DEXAMETHASONE SODIUM PHOSPHATE 10 MG/ML IJ SOLN
INTRAMUSCULAR | Status: DC | PRN
  Administered 2015-03-25: 10 mg via INTRAVENOUS

## 2015-03-25 MED ORDER — LACTATED RINGERS IR SOLN
Status: DC | PRN
Start: 2015-03-25 — End: 2015-03-25
  Administered 2015-03-25: 1

## 2015-03-25 MED ORDER — SCOPOLAMINE 1 MG/3DAYS TD PT72
MEDICATED_PATCH | TRANSDERMAL | Status: DC | PRN
  Administered 2015-03-25: 1 via TRANSDERMAL

## 2015-03-25 MED ORDER — CISATRACURIUM BESYLATE 20 MG/10ML IV SOLN
INTRAVENOUS | Status: AC
  Filled 2015-03-25: qty 10

## 2015-03-25 MED ORDER — PROMETHAZINE HCL 25 MG/ML IJ SOLN
INTRAMUSCULAR | Status: AC
  Filled 2015-03-25: qty 1

## 2015-03-25 MED ORDER — GLYCOPYRROLATE 0.2 MG/ML IJ SOLN
INTRAMUSCULAR | Status: DC | PRN
  Administered 2015-03-25: 0.6 mg via INTRAVENOUS

## 2015-03-25 MED ORDER — CHLORHEXIDINE GLUCONATE 0.12 % MT SOLN
15.0000 mL | Freq: Two times a day (BID) | OROMUCOSAL | Status: DC
  Administered 2015-03-25 – 2015-03-26 (×3): 15 mL via OROMUCOSAL
  Filled 2015-03-25 (×6): qty 15

## 2015-03-25 MED ORDER — ACETAMINOPHEN 160 MG/5ML PO SOLN: 325.0000 mg | ORAL | Status: DC | PRN

## 2015-03-25 MED ORDER — PROPOFOL 10 MG/ML IV BOLUS
INTRAVENOUS | Status: DC | PRN
  Administered 2015-03-25: 160 mg via INTRAVENOUS

## 2015-03-25 MED ORDER — CETYLPYRIDINIUM CHLORIDE 0.05 % MT LIQD: 7.0000 mL | Freq: Two times a day (BID) | OROMUCOSAL | Status: DC

## 2015-03-25 MED ORDER — SUFENTANIL CITRATE 50 MCG/ML IV SOLN
INTRAVENOUS | Status: AC
  Filled 2015-03-25: qty 1

## 2015-03-25 MED ORDER — HYDROMORPHONE HCL 1 MG/ML IJ SOLN
0.5000 mg | INTRAMUSCULAR | Status: DC | PRN
  Administered 2015-03-25: 1 mg via INTRAVENOUS
  Administered 2015-03-25: 0.5 mg via INTRAVENOUS
  Administered 2015-03-25: 1 mg via INTRAVENOUS
  Administered 2015-03-25: 0.5 mg via INTRAVENOUS
  Administered 2015-03-26 – 2015-03-27 (×5): 1 mg via INTRAVENOUS
  Filled 2015-03-25 (×9): qty 1

## 2015-03-25 MED ORDER — 0.9 % SODIUM CHLORIDE (POUR BTL) OPTIME
TOPICAL | Status: DC | PRN
  Administered 2015-03-25: 1000 mL

## 2015-03-25 MED ORDER — LIDOCAINE HCL (CARDIAC) 20 MG/ML IV SOLN
INTRAVENOUS | Status: AC
  Filled 2015-03-25: qty 5

## 2015-03-25 MED ORDER — LACTATED RINGERS IV SOLN
INTRAVENOUS | Status: DC | PRN
  Administered 2015-03-25 (×2): via INTRAVENOUS

## 2015-03-25 MED ORDER — KCL IN DEXTROSE-NACL 20-5-0.45 MEQ/L-%-% IV SOLN
INTRAVENOUS | Status: DC
Start: 2015-03-25 — End: 2015-03-27
  Administered 2015-03-25: 12:00:00 via INTRAVENOUS
  Administered 2015-03-25: 1000 mL via INTRAVENOUS
  Administered 2015-03-26 – 2015-03-27 (×4): via INTRAVENOUS
  Filled 2015-03-25 (×7): qty 1000

## 2015-03-25 MED ORDER — HYDROMORPHONE HCL 1 MG/ML IJ SOLN
INTRAMUSCULAR | Status: AC
  Filled 2015-03-25: qty 1

## 2015-03-25 MED ORDER — PROMETHAZINE HCL 25 MG RE SUPP
25.0000 mg | Freq: Four times a day (QID) | RECTAL | Status: DC | PRN
  Administered 2015-03-25 – 2015-03-26 (×2): 25 mg via RECTAL
  Filled 2015-03-25 (×2): qty 1

## 2015-03-25 MED ORDER — SUFENTANIL CITRATE 50 MCG/ML IV SOLN
INTRAVENOUS | Status: DC | PRN
  Administered 2015-03-25 (×3): 10 ug via INTRAVENOUS
  Administered 2015-03-25: 20 ug via INTRAVENOUS

## 2015-03-25 MED ORDER — CHLORHEXIDINE GLUCONATE CLOTH 2 % EX PADS: 6.0000 | MEDICATED_PAD | Freq: Once | CUTANEOUS | Status: DC

## 2015-03-25 MED ORDER — MORPHINE SULFATE 2 MG/ML IJ SOLN: 2.0000 mg | INTRAMUSCULAR | Status: DC | PRN

## 2015-03-25 MED ORDER — OXYCODONE HCL 5 MG/5ML PO SOLN
5.0000 mg | ORAL | Status: DC | PRN
Start: 2015-03-26 — End: 2015-03-26
  Filled 2015-03-25: qty 10

## 2015-03-25 MED ORDER — SCOPOLAMINE 1 MG/3DAYS TD PT72
MEDICATED_PATCH | TRANSDERMAL | Status: AC
  Filled 2015-03-25: qty 1

## 2015-03-25 MED ORDER — DEXTROSE 5 % IV SOLN
2.0000 g | INTRAVENOUS | Status: AC
  Administered 2015-03-25 (×2): 2 g via INTRAVENOUS

## 2015-03-25 MED ORDER — CISATRACURIUM BESYLATE (PF) 10 MG/5ML IV SOLN
INTRAVENOUS | Status: DC | PRN
  Administered 2015-03-25: 4 mg via INTRAVENOUS
  Administered 2015-03-25 (×2): 2 mg via INTRAVENOUS
  Administered 2015-03-25: 12 mg via INTRAVENOUS

## 2015-03-25 MED ORDER — SODIUM CHLORIDE 0.9 % IJ SOLN
INTRAMUSCULAR | Status: AC
  Filled 2015-03-25: qty 10

## 2015-03-25 MED ORDER — LIDOCAINE HCL (CARDIAC) 20 MG/ML IV SOLN
INTRAVENOUS | Status: DC | PRN
  Administered 2015-03-25: 100 mg via INTRAVENOUS

## 2015-03-25 MED ORDER — LACTATED RINGERS IV SOLN: INTRAVENOUS | Status: DC

## 2015-03-25 MED ORDER — EPHEDRINE SULFATE 50 MG/ML IJ SOLN
INTRAMUSCULAR | Status: AC
  Filled 2015-03-25: qty 1

## 2015-03-25 MED ORDER — BUPIVACAINE LIPOSOME 1.3 % IJ SUSP
20.0000 mL | Freq: Once | INTRAMUSCULAR | Status: AC
  Administered 2015-03-25: 20 mL
  Filled 2015-03-25: qty 20

## 2015-03-25 MED ORDER — DEXTROSE 5 % IV SOLN
INTRAVENOUS | Status: AC
  Filled 2015-03-25 (×2): qty 2

## 2015-03-25 MED ORDER — ACETAMINOPHEN 160 MG/5ML PO SOLN
650.0000 mg | ORAL | Status: DC | PRN
  Administered 2015-03-26: 650 mg via ORAL
  Filled 2015-03-25: qty 20.3

## 2015-03-25 MED ORDER — SUCCINYLCHOLINE CHLORIDE 20 MG/ML IJ SOLN
INTRAMUSCULAR | Status: DC | PRN
  Administered 2015-03-25: 100 mg via INTRAVENOUS

## 2015-03-25 MED ORDER — SODIUM CHLORIDE 0.9 % IJ SOLN
INTRAMUSCULAR | Status: DC | PRN
  Administered 2015-03-25: 20 mL

## 2015-03-25 MED ORDER — UNJURY CHOCOLATE CLASSIC POWDER: 2.0000 [oz_av] | Freq: Four times a day (QID) | ORAL | Status: DC

## 2015-03-25 MED ORDER — HEPARIN SODIUM (PORCINE) 5000 UNIT/ML IJ SOLN
5000.0000 [IU] | INTRAMUSCULAR | Status: AC
  Administered 2015-03-25: 5000 [IU] via SUBCUTANEOUS
  Filled 2015-03-25: qty 1

## 2015-03-25 MED ORDER — HYDROMORPHONE HCL 1 MG/ML IJ SOLN
0.2500 mg | INTRAMUSCULAR | Status: DC | PRN
  Administered 2015-03-25 (×3): 0.5 mg via INTRAVENOUS

## 2015-03-25 MED ORDER — NEOSTIGMINE METHYLSULFATE 10 MG/10ML IV SOLN
INTRAVENOUS | Status: DC | PRN
  Administered 2015-03-25: 4 mg via INTRAVENOUS

## 2015-03-25 MED ORDER — ONDANSETRON HCL 4 MG/2ML IJ SOLN
4.0000 mg | INTRAMUSCULAR | Status: DC | PRN
  Administered 2015-03-25: 4 mg via INTRAVENOUS
  Filled 2015-03-25: qty 2

## 2015-03-25 MED ORDER — MIDAZOLAM HCL 5 MG/5ML IJ SOLN
INTRAMUSCULAR | Status: DC | PRN
  Administered 2015-03-25: 2 mg via INTRAVENOUS

## 2015-03-25 MED ORDER — SODIUM CHLORIDE 0.9 % IJ SOLN
INTRAMUSCULAR | Status: AC
  Filled 2015-03-25: qty 20

## 2015-03-25 MED ORDER — HEPARIN SODIUM (PORCINE) 5000 UNIT/ML IJ SOLN
5000.0000 [IU] | Freq: Three times a day (TID) | INTRAMUSCULAR | Status: DC
  Filled 2015-03-25 (×3): qty 1

## 2015-03-25 MED ORDER — PROPOFOL 10 MG/ML IV BOLUS
INTRAVENOUS | Status: AC
  Filled 2015-03-25: qty 20

## 2015-03-25 MED ORDER — UNJURY CHICKEN SOUP POWDER
2.0000 [oz_av] | Freq: Four times a day (QID) | ORAL | Status: DC
  Administered 2015-03-27: 2 [oz_av] via ORAL

## 2015-03-25 MED ORDER — PANTOPRAZOLE SODIUM 40 MG IV SOLR
40.0000 mg | Freq: Every day | INTRAVENOUS | Status: DC
  Administered 2015-03-25 – 2015-03-26 (×2): 40 mg via INTRAVENOUS
  Filled 2015-03-25 (×3): qty 40

## 2015-03-25 MED ORDER — MIDAZOLAM HCL 2 MG/2ML IJ SOLN
INTRAMUSCULAR | Status: AC
  Filled 2015-03-25: qty 2

## 2015-03-25 MED ORDER — HEPARIN SODIUM (PORCINE) 5000 UNIT/ML IJ SOLN
5000.0000 [IU] | Freq: Three times a day (TID) | INTRAMUSCULAR | Status: DC
  Administered 2015-03-25 – 2015-03-26 (×2): 5000 [IU] via SUBCUTANEOUS
  Filled 2015-03-25 (×5): qty 1

## 2015-03-25 MED ORDER — PROMETHAZINE HCL 25 MG/ML IJ SOLN
6.2500 mg | Freq: Four times a day (QID) | INTRAMUSCULAR | Status: DC | PRN
  Administered 2015-03-25: 6.25 mg via INTRAVENOUS

## 2015-03-25 SURGICAL SUPPLY — 65 items
APL SRG 32X5 SNPLK LF DISP (MISCELLANEOUS)
APPLICATOR COTTON TIP 6IN STRL (MISCELLANEOUS) IMPLANT
APPLIER CLIP 5 13 M/L LIGAMAX5 (MISCELLANEOUS)
APPLIER CLIP ROT 10 11.4 M/L (STAPLE)
APPLIER CLIP ROT 13.4 12 LRG (CLIP)
APR CLP LRG 13.4X12 ROT 20 MLT (CLIP)
APR CLP MED LRG 11.4X10 (STAPLE)
APR CLP MED LRG 5 ANG JAW (MISCELLANEOUS)
BLADE SURG 15 STRL LF DISP TIS (BLADE) ×1 IMPLANT
BLADE SURG 15 STRL SS (BLADE) ×2
CABLE HIGH FREQUENCY MONO STRZ (ELECTRODE) IMPLANT
CLIP APPLIE 5 13 M/L LIGAMAX5 (MISCELLANEOUS) IMPLANT
CLIP APPLIE ROT 10 11.4 M/L (STAPLE) IMPLANT
CLIP APPLIE ROT 13.4 12 LRG (CLIP) IMPLANT
DEVICE SUT QUICK LOAD TK 5 (STAPLE) ×1 IMPLANT
DEVICE SUT TI-KNOT TK 5X26 (MISCELLANEOUS) ×1 IMPLANT
DEVICE SUTURE ENDOST 10MM (ENDOMECHANICALS) ×1 IMPLANT
DEVICE TROCAR PUNCTURE CLOSURE (ENDOMECHANICALS) ×2 IMPLANT
DISSECTOR BLUNT TIP ENDO 5MM (MISCELLANEOUS) ×2 IMPLANT
DRAPE CAMERA CLOSED 9X96 (DRAPES) ×2 IMPLANT
ELECT REM PT RETURN 9FT ADLT (ELECTROSURGICAL) ×2
ELECTRODE REM PT RTRN 9FT ADLT (ELECTROSURGICAL) ×1 IMPLANT
GAUZE SPONGE 4X4 12PLY STRL (GAUZE/BANDAGES/DRESSINGS) IMPLANT
GLOVE BIOGEL M 8.0 STRL (GLOVE) ×2 IMPLANT
GOWN STRL REUS W/TWL XL LVL3 (GOWN DISPOSABLE) ×8 IMPLANT
HANDLE STAPLE EGIA 4 XL (STAPLE) ×2 IMPLANT
HOVERMATT SINGLE USE (MISCELLANEOUS) ×2 IMPLANT
KIT BASIN OR (CUSTOM PROCEDURE TRAY) ×2 IMPLANT
LIQUID BAND (GAUZE/BANDAGES/DRESSINGS) ×1 IMPLANT
NDL SPNL 22GX3.5 QUINCKE BK (NEEDLE) ×1 IMPLANT
NEEDLE SPNL 22GX3.5 QUINCKE BK (NEEDLE) ×2 IMPLANT
PACK UNIVERSAL I (CUSTOM PROCEDURE TRAY) ×2 IMPLANT
PEN SKIN MARKING BROAD (MISCELLANEOUS) ×2 IMPLANT
RELOAD STAPLE 45 PURP MED/THCK (STAPLE) IMPLANT
RELOAD TRI 45 ART MED THCK BLK (STAPLE) ×2 IMPLANT
RELOAD TRI 45 ART MED THCK PUR (STAPLE) ×2 IMPLANT
RELOAD TRI 60 ART MED THCK BLK (STAPLE) ×3 IMPLANT
RELOAD TRI 60 ART MED THCK PUR (STAPLE) ×3 IMPLANT
SCISSORS LAP 5X45 EPIX DISP (ENDOMECHANICALS) IMPLANT
SCRUB PCMX 4 OZ (MISCELLANEOUS) ×4 IMPLANT
SEALANT SURGICAL APPL DUAL CAN (MISCELLANEOUS) IMPLANT
SET IRRIG TUBING LAPAROSCOPIC (IRRIGATION / IRRIGATOR) ×2 IMPLANT
SHEARS CURVED HARMONIC AC 45CM (MISCELLANEOUS) ×2 IMPLANT
SLEEVE ADV FIXATION 5X100MM (TROCAR) ×5 IMPLANT
SLEEVE GASTRECTOMY 36FR VISIGI (MISCELLANEOUS) ×2 IMPLANT
SOLUTION ANTI FOG 6CC (MISCELLANEOUS) ×2 IMPLANT
SPONGE LAP 18X18 X RAY DECT (DISPOSABLE) ×2 IMPLANT
STAPLER VISISTAT 35W (STAPLE) ×2 IMPLANT
SUT SURGIDAC NAB ES-9 0 48 120 (SUTURE) ×1 IMPLANT
SUT VIC AB 4-0 SH 18 (SUTURE) ×2 IMPLANT
SUT VICRYL 0 TIES 12 18 (SUTURE) ×2 IMPLANT
SYR 10ML ECCENTRIC (SYRINGE) ×1 IMPLANT
SYR 20CC LL (SYRINGE) ×2 IMPLANT
SYR 50ML LL SCALE MARK (SYRINGE) ×2 IMPLANT
TOWEL OR 17X26 10 PK STRL BLUE (TOWEL DISPOSABLE) ×4 IMPLANT
TOWEL OR NON WOVEN STRL DISP B (DISPOSABLE) ×2 IMPLANT
TRAY FOLEY W/METER SILVER 14FR (SET/KITS/TRAYS/PACK) IMPLANT
TROCAR ADV FIXATION 12X100MM (TROCAR) ×2 IMPLANT
TROCAR ADV FIXATION 5X100MM (TROCAR) ×2 IMPLANT
TROCAR BLADELESS 15MM (ENDOMECHANICALS) ×2 IMPLANT
TROCAR BLADELESS OPT 5 100 (ENDOMECHANICALS) ×2 IMPLANT
TUBE CALIBRATION LAPBAND (TUBING) IMPLANT
TUBING CONNECTING 10 (TUBING) ×2 IMPLANT
TUBING ENDO SMARTCAP (MISCELLANEOUS) ×2 IMPLANT
TUBING FILTER THERMOFLATOR (ELECTROSURGICAL) ×2 IMPLANT

## 2015-03-25 NOTE — H&P (View-Only) (Signed)
Chief Complaint:  Obesity and prediabetes  History of Present Illness:  Kristine Stevens is an 49 y.o. female who presents today for sleeve gastrectomy.   She is 5' and is on track to be like her grandmothers who was over 200 lbs.  She has prediabetes, arthritis, hypercholesterolemia and post ERCP pancreatitis.  She has had a prior lap chole.  ERCP however was done for type iii Sphicterof Oddi dysfunction by Achilles DunkJohn Gilliam at [facility].  She has been on our on line seminar and is interested in sleeve gastrectomy.  We discussed lapband and roux y briefly.  Will move toward sleeve gastrectomy with hiatus hernia repair since she has symptomatic reflux treated with Nexium.  She has also had a prior abdomin[place] in [place]    UGI showed a small hiatal hernia with mild gastroesophageal reflux.  She has had GER for 10 years and takes Nexium.  I discussed concomitant HH repair but that she may still have reflux.  She gets nauseated with morphine and percocet.  Does best with Dilaudid.  Prescription given for postop meds.     Past Medical History  Diagnosis Date  . Heartburn   . Prediabetes   . Hyperlipidemia   . GERD (gastroesophageal reflux disease)   . Hypertension     Past Surgical History  Procedure Laterality Date  . Cesarean section    . Cholecystectomy    . Abdominoplasty    . Knee arthroscopy    . Irrigation and debridement abscess Right 05/22/2013    Procedure: MINOR INCISION AND DRAINAGE OF ABSCESS;  Surgeon: Nicki ReaperGary R Kuzma, MD;  Location: [facility];  Service: Orthopedics;  Laterality: Right;    Current Outpatient Prescriptions  Medication Sig Dispense Refill  . amoxicillin (AMOXIL) 875 MG tablet Take 1 tablet (875 mg total) by mouth 2 (two) times daily. (Patient not taking: Reported on 01/02/2015) 14 tablet 0  . aspirin 81 MG tablet Take 81 mg by mouth at bedtime.     Marland Kitchen. atorvastatin (LIPITOR) 40 MG tablet Take 40 mg by mouth every evening.     . citalopram (CELEXA) 20 MG tablet  Take 20 mg by mouth every evening.     Marland Kitchen. esomeprazole (NEXIUM) 40 MG capsule Take 40 mg by mouth every evening.     Marland Kitchen. ibuprofen (ADVIL,MOTRIN) 200 MG tablet Take 400 mg by mouth every 6 (six) hours as needed for headache or moderate pain.    . Pediatric Multiple Vit-C-FA (FLINSTONES GUMMIES OMEGA-3 DHA PO) Take 1 each by mouth every evening.    . triamterene-hydrochlorothiazide (MAXZIDE-25) 37.5-25 MG per tablet Take 1 tablet by mouth every evening.      No current facility-administered medications for this visit.   Review of patient's allergies indicates no known allergies. Family History  Problem Relation Age of Onset  . Hypertension Mother   . Hyperlipidemia Mother   . Fibromyalgia Mother   . Hypertension Father    Social History:   reports that she has never smoked. She has never used smokeless tobacco. She reports that she does not drink alcohol or use illicit drugs.   REVIEW OF SYSTEMS : Negative except for see problem list  Physical Exam:   BMI 36 Gen:  WDWN WF NAD  Neurological: Alert and oriented to person, place, and time. Motor and sensory function is grossly intact  Head: Normocephalic and atraumatic.  Eyes: Conjunctivae are normal. Pupils are equal, round, and reactive to light. No scleral icterus.  Neck: Normal range  of motion. Neck supple. No tracheal deviation or thyromegaly present.  Cardiovascular:  SR without murmurs or gallops.  No carotid bruits Breast:  Not examined Respiratory: Effort normal.  No respiratory distress. No chest wall tenderness. Breath sounds normal.  No wheezes, rales or rhonchi.  Abdomen:  nontender GU:  Not examined Musculoskeletal: Normal range of motion. Extremities are nontender. No cyanosis, edema or clubbing noted Lymphadenopathy: No cervical, preauricular, postauricular or axillary adenopathy is present Skin: Skin is warm and dry. No rash noted. No diaphoresis. No erythema. No pallor. Pscyh: Normal mood and affect. Behavior is normal.  Judgment and thought content normal.   LABORATORY RESULTS: No results found for this or any previous visit (from the past 48 hour(s)).   RADIOLOGY RESULTS: No results found.  Problem List: Patient Active Problem List   Diagnosis Date Noted  . Hyperlipemia 01/02/2015    Assessment & Plan: Morbid obesity with multiple comorbidities for sleeve gastrectomy and repair of hiatus hernia.      Matt B. Franciso Dierks, MD, FACS  [facility] [phone] beeper [phone]  03/21/2015 9:42 AM      

## 2015-03-25 NOTE — Anesthesia Procedure Notes (Signed)
Procedure Name: Intubation Date/Time: 03/25/2015 7:22 AM Performed by: Leroy LibmanEARDON, Kristine Tranchina L Patient Re-evaluated:Patient Re-evaluated prior to inductionOxygen Delivery Method: Circle system utilized Preoxygenation: Pre-oxygenation with 100% oxygen Intubation Type: IV induction Ventilation: Mask ventilation without difficulty and Oral airway inserted - appropriate to patient size Laryngoscope Size: Hyacinth MeekerMiller and 2 Grade View: Grade II Tube type: Oral Tube size: 7.5 mm Number of attempts: 1 Airway Equipment and Method: Stylet Placement Confirmation: ETT inserted through vocal cords under direct vision,  breath sounds checked- equal and bilateral and positive ETCO2 Secured at: 21 cm Tube secured with: Tape Dental Injury: Teeth and Oropharynx as per pre-operative assessment

## 2015-03-25 NOTE — Anesthesia Preprocedure Evaluation (Signed)
Anesthesia Evaluation  Patient identified by MRN, date of birth, ID band Patient awake    Reviewed: Allergy & Precautions, H&P , NPO status , Patient's Chart, lab work & pertinent test results  History of Anesthesia Complications (+) PONV  Airway Mallampati: II  TM Distance: >3 FB Neck ROM: full    Dental no notable dental hx.    Pulmonary neg pulmonary ROS,  breath sounds clear to auscultation  Pulmonary exam normal       Cardiovascular Exercise Tolerance: Good hypertension, Pt. on medications Normal cardiovascular examRhythm:regular Rate:Normal     Neuro/Psych negative neurological ROS  negative psych ROS   GI/Hepatic negative GI ROS, Neg liver ROS,   Endo/Other  negative endocrine ROSprediabetes  Renal/GU negative Renal ROS  negative genitourinary   Musculoskeletal   Abdominal   Peds  Hematology negative hematology ROS (+)   Anesthesia Other Findings   Reproductive/Obstetrics negative OB ROS                             Anesthesia Physical Anesthesia Plan  ASA: II  Anesthesia Plan: General   Post-op Pain Management:    Induction: Intravenous  Airway Management Planned: Oral ETT  Additional Equipment:   Intra-op Plan:   Post-operative Plan: Extubation in OR  Informed Consent: I have reviewed the patients History and Physical, chart, labs and discussed the procedure including the risks, benefits and alternatives for the proposed anesthesia with the patient or authorized representative who has indicated his/her understanding and acceptance.   Dental Advisory Given  Plan Discussed with: CRNA and Surgeon  Anesthesia Plan Comments:         Anesthesia Quick Evaluation

## 2015-03-25 NOTE — Op Note (Signed)
Surgeon: Wenda LowMatt Rodnesha Elie, MD, FACS  Asst:  Gaynelle AduEric Wilson, MD, FACS  Anes:  General endotracheal  Procedure: Laparoscopic sleeve gastrectomy and one suture posterior closure of the hiatal hernia and upper endoscopy  Diagnosis: Morbid obesity and small hiatal hernia  Complications: none  EBL:   15 cc  Description of Procedure:  The patient was take to OR 1 and given general anesthesia.  The abdomen was prepped with PCMX and draped sterilely.  A timeout was performed.  Access to the abdomen was achieved with a five mm Optiview without difficulty.  Following insufflation, the state of the abdomen was found to be free of adhesions. First we did a test with the balloon calibration tube and found a significant prolapse into the chest with 10 cc of air.  Posterior dissection was performed to reveal the hiatal hernia and the right and left cruae.  These were approximated with a single surgidek and Ty Knot.  The calibration tube was reinserted and did not migrate into the chest.  The tube was withdrawn.   The ViSiGi 36Fr tube was inserted to deflate the stomach and was pulled back into the esophagus.    The pylorus was identified and we measured 5 cm back and marked the antrum.  At that point we began dissection to take down the greater curvature of the stomach using the Harmonic scalpel.  This dissection was taken all the way up to the left crus.  Posterior attachments of the stomach were also taken down.  Slightly more adhesions were seen and this may have correlated with her history of GERD.    The ViSiGi tube was then passed into the antrum and suction applied so that it was snug along the lessor curvature.  The "crow's foot" or incisura was identified.  The sleeve gastrectomy was begun using the [employer] International[employer]ov[employer]ien platform stapler beginning with a 4.5 Black load with TRS..  This was followed by two six cm black loads and completed with purple loads all with TRS.  When the sleeve was complete the tube was taken off  suction and insufflated briefly.  The tube was withdrawn.  Upper endoscopy was then performed by Dr. Andrey CampanileWilson which showed good tubular geometry and no leaks or bleeding.  .     The specimen was extracted through the 15 trocar site.  Wounds were infiltrated with Exparel and closed with 4-0 vicryl and Liquiban.  The patient was taken to the PACU in stable condition.   Matt B. Daphine DeutscherMartin, MD, [facility] [place] [phone]

## 2015-03-25 NOTE — Transfer of Care (Signed)
Immediate Anesthesia Transfer of Care Note  Patient: Kristine Stevens  Procedure(s) Performed: Procedure(s): LAPAROSCOPIC GASTRIC SLEEVE RESECTION UPPER ENDOSCOPY (N/A)  Patient Location: PACU  Anesthesia Type:General  Level of Consciousness: awake and oriented  Airway & Oxygen Therapy: Patient Spontanous Breathing and Patient connected to face mask oxygen  Post-op Assessment: Report given to RN and Post -op Vital signs reviewed and stable  Post vital signs: Reviewed and stable  Last Vitals:  Filed Vitals:   03/25/15 0518  BP: 135/76  Pulse: 73  Temp: 36.9 C  Resp: 18    Complications: No apparent anesthesia complications

## 2015-03-25 NOTE — Interval H&P Note (Signed)
History and Physical Interval Note:  03/25/2015 7:11 AM  Kristine Stevens  has presented today for surgery, with the diagnosis of MORBID OBESITY  The various methods of treatment have been discussed with the patient and family. After consideration of risks, benefits and other options for treatment, the patient has consented to  Procedure(s): LAPAROSCOPIC GASTRIC SLEEVE RESECTION (N/A) as a surgical intervention .  The patient's history has been reviewed, patient examined, no change in status, stable for surgery.  I have reviewed the patient's chart and labs.  Questions were answered to the patient's satisfaction.     Mahsa Hanser B

## 2015-03-25 NOTE — Anesthesia Postprocedure Evaluation (Signed)
  Anesthesia Post-op Note  Patient: Kristine Stevens  Procedure(s) Performed: Procedure(s) (LRB): LAPAROSCOPIC GASTRIC SLEEVE RESECTION UPPER ENDOSCOPY (N/A)  Patient Location: PACU  Anesthesia Type: General  Level of Consciousness: awake and alert   Airway and Oxygen Therapy: Patient Spontanous Breathing  Post-op Pain: mild  Post-op Assessment: Post-op Vital signs reviewed, Patient's Cardiovascular Status Stable, Respiratory Function Stable, Patent Airway and No signs of Nausea or vomiting  Last Vitals:  Filed Vitals:   03/25/15 1307  BP: 140/75  Pulse: 87  Temp: 36.9 C  Resp: 12    Post-op Vital Signs: stable   Complications: No apparent anesthesia complications

## 2015-03-26 ENCOUNTER — Encounter (HOSPITAL_COMMUNITY): Payer: Self-pay | Admitting: Surgery

## 2015-03-26 ENCOUNTER — Inpatient Hospital Stay (HOSPITAL_COMMUNITY): Payer: 59

## 2015-03-26 LAB — CBC WITH DIFFERENTIAL/PLATELET
Basophils Absolute: 0 10*3/uL (ref 0.0–0.1)
Basophils Relative: 0 % (ref 0–1)
Eosinophils Absolute: 0 10*3/uL (ref 0.0–0.7)
Eosinophils Relative: 0 % (ref 0–5)
HCT: 36.6 % (ref 36.0–46.0)
Hemoglobin: 11.9 g/dL — ABNORMAL LOW (ref 12.0–15.0)
LYMPHS PCT: 15 % (ref 12–46)
Lymphs Abs: 1.8 10*3/uL (ref 0.7–4.0)
MCH: 30.4 pg (ref 26.0–34.0)
MCHC: 32.5 g/dL (ref 30.0–36.0)
MCV: 93.4 fL (ref 78.0–100.0)
Monocytes Absolute: 1 10*3/uL (ref 0.1–1.0)
Monocytes Relative: 9 % (ref 3–12)
Neutro Abs: 9.2 10*3/uL — ABNORMAL HIGH (ref 1.7–7.7)
Neutrophils Relative %: 76 % (ref 43–77)
PLATELETS: 257 10*3/uL (ref 150–400)
RBC: 3.92 MIL/uL (ref 3.87–5.11)
RDW: 14.6 % (ref 11.5–15.5)
WBC: 12 10*3/uL — ABNORMAL HIGH (ref 4.0–10.5)

## 2015-03-26 LAB — HEMOGLOBIN AND HEMATOCRIT, BLOOD
HCT: 36.3 % (ref 36.0–46.0)
HEMOGLOBIN: 11.6 g/dL — AB (ref 12.0–15.0)

## 2015-03-26 MED ORDER — HYDROMORPHONE HCL 1 MG/ML PO LIQD: 1.0000 mg | Freq: Four times a day (QID) | ORAL | Status: DC | PRN

## 2015-03-26 MED ORDER — ENOXAPARIN (LOVENOX) PATIENT EDUCATION KIT
PACK | Freq: Once | Status: AC
  Administered 2015-03-26: 14:00:00
  Filled 2015-03-26: qty 1

## 2015-03-26 MED ORDER — DIPHENHYDRAMINE HCL 12.5 MG/5ML PO ELIX
25.0000 mg | ORAL_SOLUTION | Freq: Four times a day (QID) | ORAL | Status: DC | PRN
  Administered 2015-03-26: 25 mg via ORAL
  Filled 2015-03-26: qty 10

## 2015-03-26 MED ORDER — IOHEXOL 300 MG/ML  SOLN
50.0000 mL | Freq: Once | INTRAMUSCULAR | Status: AC | PRN
  Administered 2015-03-26: 30 mL via ORAL

## 2015-03-26 MED ORDER — ENOXAPARIN SODIUM 40 MG/0.4ML ~~LOC~~ SOLN
40.0000 mg | SUBCUTANEOUS | Status: DC
  Administered 2015-03-26: 40 mg via SUBCUTANEOUS
  Filled 2015-03-26 (×2): qty 0.4

## 2015-03-26 MED ORDER — HYDROMORPHONE HCL 1 MG/ML PO LIQD
2.0000 mg | ORAL | Status: DC | PRN
  Administered 2015-03-26 – 2015-03-27 (×4): 2 mg via ORAL
  Filled 2015-03-26 (×4): qty 2

## 2015-03-26 NOTE — Care Management Note (Signed)
Case Management Note  Patient Details  Name: Brock RaLori J Lattner MRN: [id number] Date of Birth: [birth year]  Subjective/Objective:                 Gastric sleeve   Action/Plan: home   Expected Discharge Date:                  Expected Discharge Plan:  Home/Self Care  In-House Referral:  NA  Discharge planning Services  CM Consult  Post Acute Care Choice:  NA Choice offered to:  NA  DME Arranged:  N/A DME Agency:  NA  HH Arranged:  NA HH Agency:  NA  Status of Service:  In process, will continue to follow  Medicare Important Message Given:    Date Medicare IM Given:    Medicare IM give by:    Date Additional Medicare IM Given:    Additional Medicare Important Message give by:     If discussed at Long Length of Stay Meetings, dates discussed:    Additional Comments:  Golda AcreDavis, Rhonda Lynn, RN 03/26/2015, 9:50 AM

## 2015-03-26 NOTE — Progress Notes (Signed)
Patient ID: Kristine Stevens, female   DOB: 1965/10/12, 49 y.o.   MRN: [id number] [facility] Surgery Progress Note:   1 Day Post-Op  Subjective: Mental status is clear.  Seen after UGI Objective: Vital signs in last 24 hours: Temp:  [97.9 F (36.6 C)-98.4 F (36.9 C)] 98.3 F (36.8 C) (06/29 1400) Pulse Rate:  [62-85] 68 (06/29 1400) Resp:  [12-14] 12 (06/29 1400) BP: (117-141)/(56-89) 141/89 mmHg (06/29 1400) SpO2:  [95 %-100 %] 99 % (06/29 1400)  Intake/Output from previous day: 06/28 0701 - 06/29 0700 In: 3573.3 [I.V.:3573.3] Out: 3600 [Urine:3550; Blood:50] Intake/Output this shift: Total I/O In: 511.7 [P.O.:120; I.V.:391.7] Out: 1125 [Urine:1125]  Physical Exam: Work of breathing is not elevated.  Incisions OK  Lab Results:  Results for orders placed or performed during the hospital encounter of 03/25/15 (from the past 48 hour(s))  Pregnancy, urine STAT morning of surgery     Status: None   Collection Time: 03/25/15  5:33 AM  Result Value Ref Range   Preg Test, Ur NEGATIVE NEGATIVE    Comment:        THE SENSITIVITY OF THIS METHODOLOGY IS >20 mIU/mL.   Potassium     Status: Abnormal   Collection Time: 03/25/15  5:40 AM  Result Value Ref Range   Potassium 3.0 (L) 3.5 - 5.1 mmol/L  CBC     Status: Abnormal   Collection Time: 03/25/15 10:42 AM  Result Value Ref Range   WBC 11.9 (H) 4.0 - 10.5 K/uL   RBC 4.26 3.87 - 5.11 MIL/uL   Hemoglobin 12.8 12.0 - 15.0 g/dL   HCT 39.1 36.0 - 46.0 %   MCV 91.8 78.0 - 100.0 fL   MCH 30.0 26.0 - 34.0 pg   MCHC 32.7 30.0 - 36.0 g/dL   RDW 14.2 11.5 - 15.5 %   Platelets 267 150 - 400 K/uL  Creatinine, serum     Status: None   Collection Time: 03/25/15 10:42 AM  Result Value Ref Range   Creatinine, Ser 0.80 0.44 - 1.00 mg/dL   GFR calc non Af Amer >60 >60 mL/min   GFR calc Af Amer >60 >60 mL/min    Comment: (NOTE) The eGFR has been calculated using the CKD EPI equation. This calculation has not been validated in all  clinical situations. eGFR's persistently <60 mL/min signify possible Chronic Kidney Disease.   CBC WITH DIFFERENTIAL     Status: Abnormal   Collection Time: 03/26/15  5:34 AM  Result Value Ref Range   WBC 12.0 (H) 4.0 - 10.5 K/uL   RBC 3.92 3.87 - 5.11 MIL/uL   Hemoglobin 11.9 (L) 12.0 - 15.0 g/dL   HCT 36.6 36.0 - 46.0 %   MCV 93.4 78.0 - 100.0 fL   MCH 30.4 26.0 - 34.0 pg   MCHC 32.5 30.0 - 36.0 g/dL   RDW 14.6 11.5 - 15.5 %   Platelets 257 150 - 400 K/uL   Neutrophils Relative % 76 43 - 77 %   Neutro Abs 9.2 (H) 1.7 - 7.7 K/uL   Lymphocytes Relative 15 12 - 46 %   Lymphs Abs 1.8 0.7 - 4.0 K/uL   Monocytes Relative 9 3 - 12 %   Monocytes Absolute 1.0 0.1 - 1.0 K/uL   Eosinophils Relative 0 0 - 5 %   Eosinophils Absolute 0.0 0.0 - 0.7 K/uL   Basophils Relative 0 0 - 1 %   Basophils Absolute 0.0 0.0 - 0.1 K/uL  Radiology/Results: Dg Ugi W/water Sol Cm  03/26/2015   CLINICAL DATA:  Status post gastric sleeve  EXAM: WATER SOLUBLE UPPER GI SERIES  TECHNIQUE: Single-column upper GI series was performed using water soluble contrast.  CONTRAST:  6m OMNIPAQUE IOHEXOL 300 MG/ML  SOLN  COMPARISON:  None.  FLUOROSCOPY TIME:  Fluoroscopy Time (in minutes and seconds): 29 seconds  Number of Acquired Images:  7  FINDINGS: Scout radiograph demonstrates postsurgical changes in the left upper abdomen and cholecystectomy clips.  No evidence of leak.  Proximal duodenum is within normal limits.  IMPRESSION: Satisfactory postoperative appearance status post gastric sleeve.  No evidence of leak.   Electronically Signed   By: SJulian HyM.D.   On: 03/26/2015 09:19    Anti-infectives: Anti-infectives    Start     Dose/Rate Route Frequency Ordered Stop   03/25/15 0532  cefOXitin (MEFOXIN) 2 g in dextrose 5 % 50 mL IVPB     2 g 100 mL/hr over 30 Minutes Intravenous On call to O.R. 03/25/15 0532 03/25/15 0925      Assessment/Plan: Problem List: Patient Active Problem List   Diagnosis  Date Noted  . 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 S/P laparoscopic sleeve gastrectomy 03/25/2015  . Hyperlipemia 01/02/2015    UGI OK.  Begin PD 1 sleeve diet 1 Day Post-Op    LOS: 1 day   Matt B. MHassell Done MD, FCasa [facility] [phone] [phone] 03/26/2015 2:44 PM

## 2015-03-26 NOTE — Progress Notes (Signed)
Patient alert and oriented, Post op day 1.  Provided support and encouragement.  Encouraged pulmonary toilet, ambulation and small sips of liquids.  All questions answered.  Will continue to monitor. 

## 2015-03-26 NOTE — Plan of Care (Signed)
Problem: Food- and Nutrition-Related Knowledge Deficit (NB-1.1) Goal: Nutrition education Formal process to instruct or train a patient/client in a skill or to impart knowledge to help patients/clients voluntarily manage or modify food choices and eating behavior to maintain or improve health. Outcome: Completed/Met Date Met:  03/26/15 Nutrition Education Note  Received consult for diet education per DROP protocol.   Discussed 2 week post op diet with pt. Emphasized that liquids must be non carbonated, non caffeinated, and sugar free. Fluid goals discussed. Pt to follow up with outpatient bariatric RD for further diet progression after 2 weeks. Multivitamins and minerals also reviewed. Teach back method used, pt expressed understanding, expect good compliance.   Diet: First 2 Weeks  You will see the nutritionist about two (2) weeks after your surgery. The nutritionist will increase the types of foods you can eat if you are handling liquids well:  If you have severe vomiting or nausea and cannot handle clear liquids lasting longer than 1 day, call your surgeon  Protein Shake  Drink at least 2 ounces of shake 5-6 times per day  Each serving of protein shakes (usually 8 - 12 ounces) should have a minimum of:  15 grams of protein  And no more than 5 grams of carbohydrate  Goal for protein each day:  Men = 80 grams per day  Women = 60 grams per day  Protein powder may be added to fluids such as non-fat milk or Lactaid milk or Soy milk (limit to 35 grams added protein powder per serving)   Hydration  Slowly increase the amount of water and other clear liquids as tolerated (See Acceptable Fluids)  Slowly increase the amount of protein shake as tolerated  Sip fluids slowly and throughout the day  May use sugar substitutes in small amounts (no more than 6 - 8 packets per day; i.e. Splenda)   Fluid Goal  The first goal is to drink at least 8 ounces of protein shake/drink per day (or as directed  by the nutritionist); some examples of protein shakes are [employer], [employer], EAS Edge HP, and Unjury. See handout from pre-op Bariatric Education Class:  Slowly increase the amount of protein shake you drink as tolerated  You may find it easier to slowly sip shakes throughout the day  It is important to get your proteins in first  Your fluid goal is to drink 64 - 100 ounces of fluid daily  It may take a few weeks to build up to this  32 oz (or more) should be clear liquids  And  32 oz (or more) should be full liquids (see below for examples)  Liquids should not contain sugar, caffeine, or carbonation   Clear Liquids:  Water or Sugar-free flavored water (i.e. Fruit H2O, Propel)  Decaffeinated coffee or tea (sugar-free)  Crystal Lite, Wyler's Lite, Minute Maid Lite  Sugar-free Jell-O  Bouillon or broth  Sugar-free Popsicle: *Less than 20 calories each; Limit 1 per day   Full Liquids:  Protein Shakes/Drinks + 2 choices per day of other full liquids  Full liquids must be:  No More Than 12 grams of Carbs per serving  No More Than 3 grams of Fat per serving  Strained low-fat cream soup  Non-Fat milk  Fat-free Lactaid Milk  Sugar-free yogurt (Dannon Lite & Fit, Greek yogurt)     Clayton Bibles, MS, RD, LDN Pager: [phone] After Hours Pager: [phone]

## 2015-03-26 NOTE — Progress Notes (Signed)
Patient able to provide nurse with demonstration of injection of Lovenox. Teaching kit provided. Questions answered

## 2015-03-26 NOTE — Progress Notes (Signed)
ANTICOAGULATION CONSULT NOTE - Initial Consult  Pharmacy Consult for Lovenox Indication: VTE prophylaxis following surgery  No Known Allergies  Patient Measurements: Height: 5\' 1"  (154.9 cm) Weight: 193 lb 4 oz (87.658 kg) IBW/kg (Calculated) : 47.8 Heparin Dosing Weight:   Vital Signs: Temp: 97.9 F (36.6 C) (06/29 1000) Temp Source: Oral (06/29 1000) BP: 133/72 mmHg (06/29 1000) Pulse Rate: 63 (06/29 1000)  Labs:  Recent Labs  03/24/15 1005 03/25/15 1042 03/26/15 0534  HGB 13.2 12.8 11.9*  HCT 40.8 39.1 36.6  PLT 294 267 257  CREATININE 0.72 0.80  --     Estimated Creatinine Clearance: 86.6 mL/min (by C-G formula based on Cr of 0.8).   Medical History: Past Medical History  Diagnosis Date  . Heartburn   . Prediabetes   . Hyperlipidemia   . GERD (gastroesophageal reflux disease)   . Hypertension   . Sphincter of Oddi dysfunction   . PONV (postoperative nausea and vomiting)     severe    Assessment: [age] yoF with morbid obesity s/p laparoscopic gastric sleeve resection on 6/28.  Pharmacy consulted to start lovenox for VTE prophylaxis.  No issues noted with CBC.  Renal fxn ~87 ml/min.    Weight = 87.7kg, Height 5'1" BMI = 36.5  Goal of Therapy:  Monitor platelets by anticoagulation protocol: Yes  4 hour heparin level = 0.3-0.6 units/ml   Plan:  Lovenox 0.5mg /kg/day = 44mg , could round to 40mg  for ease of administration and syringe size.   Need to clarify start date as pt currently on SQ heparin TID. RN anticipates discharge tomorrow 6/30.   Haynes Hoehnolleen Wash Nienhaus, PharmD, BCPS 03/26/2015, 11:02 AM  Pager: [phone]

## 2015-03-26 NOTE — Progress Notes (Signed)
Date:  March 26, 2015 U.R. performed for needs and level of care. Will continue to follow for Case Management needs.  Chancellor Vanderloop, RN, BSN, CCM   [phone] 

## 2015-03-27 ENCOUNTER — Ambulatory Visit: Payer: 59 | Admitting: Dietician

## 2015-03-27 LAB — CBC WITH DIFFERENTIAL/PLATELET
Basophils Absolute: 0 10*3/uL (ref 0.0–0.1)
Basophils Relative: 0 % (ref 0–1)
Eosinophils Absolute: 0.1 10*3/uL (ref 0.0–0.7)
Eosinophils Relative: 1 % (ref 0–5)
HEMATOCRIT: 35.3 % — AB (ref 36.0–46.0)
Hemoglobin: 11.2 g/dL — ABNORMAL LOW (ref 12.0–15.0)
Lymphocytes Relative: 35 % (ref 12–46)
Lymphs Abs: 3.2 10*3/uL (ref 0.7–4.0)
MCH: 30.1 pg (ref 26.0–34.0)
MCHC: 31.7 g/dL (ref 30.0–36.0)
MCV: 94.9 fL (ref 78.0–100.0)
MONOS PCT: 7 % (ref 3–12)
Monocytes Absolute: 0.7 10*3/uL (ref 0.1–1.0)
NEUTROS ABS: 5.2 10*3/uL (ref 1.7–7.7)
Neutrophils Relative %: 57 % (ref 43–77)
PLATELETS: 264 10*3/uL (ref 150–400)
RBC: 3.72 MIL/uL — ABNORMAL LOW (ref 3.87–5.11)
RDW: 15 % (ref 11.5–15.5)
WBC: 9.3 10*3/uL (ref 4.0–10.5)

## 2015-03-27 MED ORDER — PROMETHAZINE HCL 25 MG RE SUPP: 25.0000 mg | Freq: Four times a day (QID) | RECTAL | Status: DC | PRN

## 2015-03-27 NOTE — Progress Notes (Signed)
Patient alert and oriented, pain is controlled. Patient is tolerating fluids,  advanced to protein shake today, patient tolerated well.  Reviewed Gastric sleeve discharge instructions with patient and patient is able to articulate understanding.  Provided information on BELT program, Support Group and [facility] outpatient pharmacy. All questions answered, will continue to monitor.  

## 2015-03-27 NOTE — Progress Notes (Signed)
Discharge instructions given. Questions answered 

## 2015-03-27 NOTE — Discharge Instructions (Signed)

## 2015-03-27 NOTE — Discharge Summary (Signed)
Physician Discharge Summary  Patient ID: Kristine Stevens J Athey MRN: [id number] DOB/AGE: [birth year] 48 y.o.  Admit date: 03/25/2015 Discharge date: 03/27/2015  Admission Diagnoses:  Morbid obesity  Discharge Diagnoses:  same  Principal Problem:   Status post laparoscopic sleeve gastrectomy June 2016   Surgery:  Laparoscopic sleeve gastrectomy with posterior hiatal hernia repair (1 suture)  Discharged Condition: improved  Hospital Course:   Had surgery.  UGI on PD 1 looked ok.  Ready for discharge on PD 2.  She decided not to take the road trip to [place] and so Lovenox is not needed.    Consults: none  Significant Diagnostic Studies: UGI    Discharge Exam: Blood pressure 108/61, pulse 65, temperature 98.3 F (36.8 C), temperature source Oral, resp. rate 14, height 5\' 1"  (1.549 m), weight 87.658 kg (193 lb 4 oz), SpO2 98 %. Incisions OK  Disposition: 01-Home or Self Care  Discharge Instructions    Ambulate hourly while awake    Complete by:  As directed      Call MD for:  difficulty breathing, headache or visual disturbances    Complete by:  As directed      Call MD for:  persistant dizziness or light-headedness    Complete by:  As directed      Call MD for:  persistant nausea and vomiting    Complete by:  As directed      Call MD for:  redness, tenderness, or signs of infection (pain, swelling, redness, odor or green/yellow discharge around incision site)    Complete by:  As directed      Call MD for:  severe uncontrolled pain    Complete by:  As directed      Call MD for:  temperature >101 F    Complete by:  As directed      Diet bariatric full liquid    Complete by:  As directed      Incentive spirometry    Complete by:  As directed   Perform hourly while awake            Medication List    STOP taking these medications        amoxicillin 875 MG tablet  Commonly known as:  AMOXIL     aspirin 81 MG tablet     ibuprofen 200 MG tablet  Commonly known as:   ADVIL,MOTRIN      TAKE these medications        atorvastatin 40 MG tablet  Commonly known as:  LIPITOR  Take 40 mg by mouth every evening.     citalopram 20 MG tablet  Commonly known as:  CELEXA  Take 20 mg by mouth every evening.     esomeprazole 40 MG capsule  Commonly known as:  NEXIUM  Take 40 mg by mouth every evening.     FLINSTONES GUMMIES OMEGA-3 DHA PO  Take 1 each by mouth every evening. Chewable tablet-not gummies     promethazine 25 MG suppository  Commonly known as:  PHENERGAN  Place 1 suppository (25 mg total) rectally every 6 (six) hours as needed for nausea or vomiting.     triamterene-hydrochlorothiazide 37.5-25 MG per tablet  Commonly known as:  MAXZIDE-25  Take 1 tablet by mouth every evening.  Notes to Patient:  Monitor Blood Pressure daily and keep a log for PCP, there may need to be changes made to medication dosage with rapid weight loss.  Monitor for signs of dehydration with combo pill including  diuretic           Follow-up Information    Follow up with Valarie Merino, MD. Go on 04/04/2015.   Specialty:  General Surgery   Why:  For Post-Op Check at 1:30 PM   Contact information:   [address] [place] [address] [phone]       Signed: Valarie Merino 03/27/2015, 11:22 AM

## 2015-03-28 ENCOUNTER — Telehealth (HOSPITAL_COMMUNITY): Payer: Self-pay

## 2015-03-28 NOTE — Telephone Encounter (Signed)
Made discharge phone call to patient per DROP protocol. Asking the following questions.    1. Do you have someone to care for you now that you are home?  yes 2. Are you having pain now that is not relieved by your pain medication?  no 3. Are you able to drink the recommended daily amount of fluids (48 ounces minimum/day) and protein (60-80 grams/day) as prescribed by the dietitian or nutritional counselor?  Yes, I am getting it in I am surprised 4. Are you taking the vitamins and minerals as prescribed?  yes 5. Do you have the "on call" number to contact your surgeon if you have a problem or question?  yes 6. Are your incisions free of redness, swelling or drainage? (If steri strips, address that these can fall off, shower as tolerated) yes 7. Have your bowels moved since your surgery?  If not, are you passing gas?  No, yes 8. Are you up and walking 3-4 times per day?  yes    1. Do you have an appointment made to see your surgeon in the next month?  yes 2. Were you provided your discharge medications before your surgery or before you were discharged from the hospital and are you taking them without problem?  yes 3. Were you provided phone numbers to the clinic/surgeon's office?  yes 4. Did you watch the patient education video module in the (clinic, surgeon's office, etc.) before your surgery? yes 5. Do you have a discharge checklist that was provided to you in the hospital to reference with instructions on how to take care of yourself after surgery?  yes 6. Did you see a dietitian or nutritional counselor while you were in the hospital?  yes 7. Do you have an appointment to see a dietitian or nutritional counselor in the next month?  yes

## 2015-04-08 ENCOUNTER — Encounter: Payer: 59 | Attending: Physician Assistant

## 2015-04-08 VITALS — Ht 60.5 in | Wt 181.5 lb

## 2015-04-08 DIAGNOSIS — Z713 Dietary counseling and surveillance: Secondary | ICD-10-CM | POA: Diagnosis not present

## 2015-04-08 DIAGNOSIS — E669 Obesity, unspecified: Secondary | ICD-10-CM

## 2015-04-08 DIAGNOSIS — Z6837 Body mass index (BMI) 37.0-37.9, adult: Secondary | ICD-10-CM | POA: Insufficient documentation

## 2015-04-08 NOTE — Progress Notes (Signed)
Bariatric Class:  Appt start time: 1530 end time:  1630.  2 Week Post-Operative Nutrition Class  Patient was seen on 04/08/15 for Post-Operative Nutrition education at [facility].   Surgery date: 03/25/15 Surgery type: gastric sleeve Start weight at [facility]: 192 lbs Weight today: 181.5 lbs  Weight change: 13 lbs  TANITA  BODY COMP RESULTS  03/03/15 04/08/15   BMI (kg/m^2) 37.4 34.9   Fat Mass (lbs) 91 79.0   Fat Free Mass (lbs) 103.5 102.5   Total Body Water (lbs) 76 75.0    The following the learning objectives were met by the patient during this course:  Identifies Phase 3A (Soft, High Proteins) Dietary Goals and will begin from 2 weeks post-operatively to 2 months post-operatively  Identifies appropriate sources of fluids and proteins   States protein recommendations and appropriate sources post-operatively  Identifies the need for appropriate texture modifications, mastication, and bite sizes when consuming solids  Identifies appropriate multivitamin and calcium sources post-operatively  Describes the need for physical activity post-operatively and will follow MD recommendations  States when to call healthcare provider regarding medication questions or post-operative complications  Handouts given during class include:  Phase 3A: Soft, High Protein Diet Handout  Follow-Up Plan: Patient will follow-up at [facility] in 6 weeks for 2 month post-op nutrition visit for diet advancement per MD.

## 2015-04-17 ENCOUNTER — Other Ambulatory Visit: Payer: Self-pay

## 2015-04-17 VITALS — BP 120/84 | HR 76 | Resp 14 | Ht 61.0 in | Wt 179.2 lb

## 2015-04-17 DIAGNOSIS — K219 Gastro-esophageal reflux disease without esophagitis: Secondary | ICD-10-CM | POA: Insufficient documentation

## 2015-04-17 DIAGNOSIS — R7303 Prediabetes: Secondary | ICD-10-CM

## 2015-04-17 LAB — POCT GLYCOSYLATED HEMOGLOBIN (HGB A1C): Hemoglobin A1C: 5.8

## 2015-04-17 NOTE — Patient Instructions (Signed)
1. Plan to meet with RD at [facility] on 05/20/15 for follow up 2. Plan to continue to follow post gastric sleeve diet 3. Plan to walk for 30 minutes 6 days a week 4. Plan to see primary provider in August 5. Plan to return to [facility] on 09/16/15 at 9:30AM at [address] [facility] office

## 2015-04-17 NOTE — Patient Outreach (Signed)
[facility]) Care Management   04/17/2015  Kristine Stevens [birth year] [id number]  Kristine Stevens is an 49 y.o. female.   Member seen for follow up office visit for [facility] program for self management of Pre-diabetes  Subjective: Member states that she had gastric sleeve surgery 3 weeks ago.  States she is still having some pain when she eats.  States she also had a hiatal hernia repair at the same time.  States she is following a soft diet and is eating more protein in the form of shakes and protein water.  States she has been walking almost daily.  States she is feeling good about her weight loss and she is not feeling hungry.  Objective:   Review of Systems  Gastrointestinal: Positive for abdominal pain.  POCT Hemoglobin A1C-5.8  Physical Exam  Today's Vitals   04/17/15 0956 04/17/15 1000  BP: 120/84   Pulse: 76   Resp: 14   Height: 1.549 m (5\' 1" )   Weight: 179 lb 3.2 oz (81.285 kg)   SpO2: 97%   PainSc: 2  3   PainLoc: Abdomen     Current Medications:   Current Outpatient Prescriptions  Medication Sig Dispense Refill  . atorvastatin (LIPITOR) 40 MG tablet Take 40 mg by mouth every evening.     . citalopram (CELEXA) 20 MG tablet Take 20 mg by mouth every evening.     Marland Kitchen esomeprazole (NEXIUM) 40 MG capsule Take 40 mg by mouth every evening.     . Pediatric Multiple Vit-C-FA (FLINSTONES GUMMIES OMEGA-3 DHA PO) Take 1 each by mouth 2 (two) times daily. Chewable tablet-not gummies    . promethazine (PHENERGAN) 25 MG suppository Place 1 suppository (25 mg total) rectally every 6 (six) hours as needed for nausea or vomiting. 12 each 0  . triamterene-hydrochlorothiazide (MAXZIDE-25) 37.5-25 MG per tablet Take 1 tablet by mouth every evening.      No current facility-administered medications for this visit.    Functional Status:   In your present state of health, do you have any difficulty performing the following activities: 04/17/2015 03/25/2015  Hearing? N  N  Vision? N N  Difficulty concentrating or making decisions? N N  Walking or climbing stairs? N N  Dressing or bathing? N N  Doing errands, shopping? N N    Fall/Depression Screening:    PHQ 2/9 Scores 04/17/2015 01/23/2015 01/02/2015  PHQ - 2 Score 0 0 0   [facility] CM Care Plan Problem One        Patient Outreach from 04/17/2015 in [facility] [employer]   Care Plan Problem One  Potential for elevated blood sugars related to dx of prediabetes   Care Plan for Problem One  Active   [facility] Long Term Goal (31-90 days)  Member will maintain hemoglobin A1C below 6.5 for the next 90 days   [facility] Long Term Goal Start Date  04/17/15 [Maintained hemoglobin A1C below 6.5]   Interventions for Problem One Long Term Goal  Reviewed to follow post gastric sleeve surgery diet with more protein and limited CHO. Reinforced importance of  regular exercise for glycemic control, Reinforced to keep appointments with RD and primary care provider      Assessment:   Member seen for follow up office visit for [facility] program for self management of pre-diabetes. Member is recovering from gastric sleeve surgery.  She has lost 15.2 lbs since last [facility] visit.  She has not been checking  her CBGs.  She reports she is following post gastric sleeve surgery diet which is very limited in the [facility] and has more protein.  POCT hemoglobin A1C 5.8 today and is well under goal of 6.5.   Plan:   Plan to meet with RD at [facility] on 05/20/15 for follow up Plan to continue to follow post gastric sleeve diet Plan to walk for 30 minutes 6 days a week Plan to see primary provider in August Plan to return to [facility] on 09/16/15 at 9:30AM at [address] [facility] office  Dudley Major RN, [facility] Care Management Coordinator-Link to Wellness [facility] Care Management [phone]

## 2015-05-08 ENCOUNTER — Other Ambulatory Visit (INDEPENDENT_AMBULATORY_CARE_PROVIDER_SITE_OTHER): Payer: Self-pay | Admitting: Physician Assistant

## 2015-05-08 DIAGNOSIS — R1013 Epigastric pain: Secondary | ICD-10-CM

## 2015-05-12 ENCOUNTER — Other Ambulatory Visit: Payer: 59

## 2015-05-14 ENCOUNTER — Ambulatory Visit
Admission: RE | Admit: 2015-05-14 | Discharge: 2015-05-14 | Disposition: A | Discharge status: Home / Self Care | Payer: 59 | Source: Ambulatory Visit | Attending: Physician Assistant | Admitting: Physician Assistant

## 2015-05-14 ENCOUNTER — Other Ambulatory Visit (INDEPENDENT_AMBULATORY_CARE_PROVIDER_SITE_OTHER): Payer: Self-pay | Admitting: Physician Assistant

## 2015-05-14 DIAGNOSIS — R1013 Epigastric pain: Secondary | ICD-10-CM

## 2015-05-20 ENCOUNTER — Encounter: Payer: 59 | Attending: Physician Assistant | Admitting: Dietician

## 2015-05-20 DIAGNOSIS — Z713 Dietary counseling and surveillance: Secondary | ICD-10-CM | POA: Insufficient documentation

## 2015-05-20 DIAGNOSIS — Z6837 Body mass index (BMI) 37.0-37.9, adult: Secondary | ICD-10-CM | POA: Insufficient documentation

## 2015-05-20 NOTE — Patient Instructions (Signed)
Goals:  Follow Phase 3B: High Protein + Non-Starchy Vegetables  Eat 3-6 small meals/snacks, every 3-5 hrs  Increase lean protein foods to meet 60g goal  Increase fluid intake to 64oz +  Avoid drinking 15 minutes before, during and 30 minutes after eating  Aim for >30 min of physical activity daily  Limit carbs like sweet potato, sweet tea, and frozen yogurt   TANITA  BODY COMP RESULTS  03/03/15 04/08/15 05/20/15   BMI (kg/m^2) 37.4 34.9 33.6   Fat Mass (lbs) 91 79.0 73   Fat Free Mass (lbs) 103.5 102.5 102   Total Body Water (lbs) 76 75.0 74.5

## 2015-05-20 NOTE — Progress Notes (Signed)
  Follow-up visit:  8 Weeks Post-Operative Sleeve gastrectomy Surgery  Medical Nutrition Therapy:  Appt start time: 1015 end time:  1115.  Primary concerns today: Post-operative Bariatric Surgery Nutrition Management.  Kristine Stevens returns having lost a total of 17 lbs. She reports having continued discomfort, pain, and reflux. She had an upper GI and plans to follow up with Dr. Daphine Deutscher to discuss the results. She feels like she is recognizing her hunger and fullness cues. Discouraged about slow rate of weight loss but noticing that she is losing inches. Able to tolerate only about 1.5 oz of meat at a time.   Surgery date: 03/25/15 Surgery type: gastric sleeve Start weight at [facility]: 192 lbs Weight today: 175 lbs Weight change: 6.5 lbs Total weight lost: 17 lbs  Goal weight: 125 lbs  TANITA  BODY COMP RESULTS  03/03/15 04/08/15 05/20/15   BMI (kg/m^2) 37.4 34.9 33.6   Fat Mass (lbs) 91 79.0 73   Fat Free Mass (lbs) 103.5 102.5 102   Total Body Water (lbs) 76 75.0 74.5    Preferred Learning Style:   No preference indicated   Learning Readiness:  Ready  24-hr recall: B (AM): Premier protein shake (30g) Snk (AM):   L (PM): [place] or ham and cheese roll up and [place] yogurt (26g) Snk (PM):   D (PM): meat + vegetable soup OR 1/2 sweet potato  Snk (PM): yogurt or Babybel cheese (12g)  Fluid intake: insufficient some days, sometimes water causes pain (water, water with sugar free flavoring, 1/2 sweet 1/2 unsweet tea rarely, protein shakes) Estimated total protein intake: 68g per day  Medications: none Supplementation: taking  Last patient reported A1c: 5.8%  Using straws: not usually Drinking while eating: no, causes pain Hair loss: no Carbonated beverages: no, not tempted by sodas N/V/D/C: vomiting with shrimp at [employer] Dumping syndrome: no  Recent physical activity:  Walking almost daily for 30 minutes, plans to start BELT  Progress Towards Goal(s):  In  progress.  Handouts given during visit include:  Phase 3B lean protein + non starchy vegetables  Snack list   Nutritional Diagnosis:  [place]-3.3 Overweight/obesity related to past poor dietary habits and physical inactivity as evidenced by patient w/ recent sleeve gastrectomy surgery following dietary guidelines for continued weight loss.     Intervention:  Nutrition counseling provided.  Samples provided and patient instructed on proper use: Unjury protein powder (chocolate - qty 3) Lot#: [id number] Exp: 03/2016  Bariatric Advantage Complete multivitamin and calcium crystals (wild berry punch - qty 2) Lot#: [id number] Exp: 05/2016  Bariatric Advantage Complete multivitamin and calcium crystals (citrus splash - qty 2) Lot#: [id number] Exp: 04/2016  Teaching Method Utilized:  Visual Auditory Hands on  Barriers to learning/adherence to lifestyle change: none  Demonstrated degree of understanding via:  Teach Back   Monitoring/Evaluation:  Dietary intake, exercise, and body weight. Follow up in 4 months for 6 month post-op visit.

## 2015-08-28 ENCOUNTER — Other Ambulatory Visit: Payer: Self-pay | Admitting: Surgery

## 2015-08-28 DIAGNOSIS — E7800 Pure hypercholesterolemia, unspecified: Secondary | ICD-10-CM

## 2015-08-28 MED ORDER — CHOLESTYRAMINE 4 G PO PACK: 1.0000 | PACK | Freq: Two times a day (BID) | ORAL | Status: DC

## 2015-08-28 NOTE — Telephone Encounter (Signed)
Reviewed recent labs with her.  She is currently changing PCPs.  LDL elevated to 165 (<130) and cholesterol is 245 (<200)  She prefers not to restart statin at this time.  Will Rx Questran qd.   Wenda LowMatt Creighton Longley, MD, FACS

## 2015-09-16 ENCOUNTER — Ambulatory Visit: Payer: Self-pay

## 2015-09-23 ENCOUNTER — Ambulatory Visit: Payer: 59 | Admitting: Dietician

## 2015-10-06 MED FILL — CITALOPRAM HBR 20 MG TABLET: 20 | 30 days supply | Qty: 30 | Fill #0

## 2015-11-10 MED FILL — CITALOPRAM HBR 20 MG TABLET: 20 | 30 days supply | Qty: 30 | Fill #1

## 2015-12-12 DIAGNOSIS — Z9884 Bariatric surgery status: Secondary | ICD-10-CM | POA: Diagnosis not present

## 2015-12-12 MED FILL — CHOLESTYRAMINE PACKET: 4 | 30 days supply | Qty: 60 | Fill #1

## 2015-12-12 MED FILL — CITALOPRAM HBR 20 MG TABLET: 20 | 30 days supply | Qty: 30 | Fill #2

## 2016-01-12 MED FILL — CITALOPRAM HBR 20 MG TABLET: 20 | 30 days supply | Qty: 30 | Fill #3

## 2016-01-14 ENCOUNTER — Telehealth: Payer: Self-pay

## 2016-01-19 DIAGNOSIS — H524 Presbyopia: Secondary | ICD-10-CM | POA: Diagnosis not present

## 2016-01-20 ENCOUNTER — Telehealth: Payer: Self-pay

## 2016-02-03 ENCOUNTER — Encounter: Payer: 59 | Attending: Surgery | Admitting: Dietician

## 2016-02-03 ENCOUNTER — Encounter: Payer: Self-pay | Admitting: Dietician

## 2016-02-03 DIAGNOSIS — Z9884 Bariatric surgery status: Secondary | ICD-10-CM | POA: Insufficient documentation

## 2016-02-03 DIAGNOSIS — Z09 Encounter for follow-up examination after completed treatment for conditions other than malignant neoplasm: Secondary | ICD-10-CM | POA: Diagnosis not present

## 2016-02-03 NOTE — Progress Notes (Signed)
  Follow-up visit:  11 months Post-Operative Sleeve gastrectomy Surgery  Medical Nutrition Therapy:  Appt start time: 1120 end time:  1215  Primary concerns today: Post-operative Bariatric Surgery Nutrition Management.  [name] returns having lost another 6 pounds. She states that she is discouraged, states that she has been "stuck" in the 160s for months. Feels restriction but feels hungry frequently.   Surgery date: 03/25/15 Surgery type: gastric sleeve [facility] lbs (196 lbs) Weight today: 168.8 lbs Weight change: 6.2 lbs Total weight lost: 27.2 lbs  Goal weight: 139 lbs  TANITA  BODY COMP RESULTS  03/03/15 04/08/15 05/20/15 02/03/16   BMI (kg/m^2) 37.4 34.9 33.6 N/A   Fat Mass (lbs) 91 79.0 73    Fat Free Mass (lbs) 103.5 102.5 102    Total Body Water (lbs) 76 75.0 74.5     Preferred Learning Style:   No preference indicated   Learning Readiness:  Ready  24-hr recall: B (AM): Premier protein shake (30g) Snk (AM):  Scrambled or fried egg with deli meat or bacon and cheese (10g) L (PM): salad OR lunchmeat and cheese stick or tuna salad Snk (PM):   D (5-6 PM): broccoli and cheese, sometimes sweet potato, fish OR hot dog without bun OR grilled chicken or steak Snk (PM): sometimes pork skins and queso dip or cheese stick   Fluid intake: 0-11 oz protein shake, 32 oz unsweet tea with stevia, some water   Estimated total protein intake: 60+g per day  Medications: none Supplementation: taking  Last patient reported A1c: 5.8%  Using straws: not usually Drinking while eating: no, causes pain  Hair loss: no Carbonated beverages: 2-3 eight-oz diet Dr. Alcus DadPeppers per week N/V/D/C: none Dumping syndrome: no  Recent physical activity:  none  Progress Towards Goal(s):  In progress.  Handouts given during visit include:  none   Nutritional Diagnosis:  [place]-3.3 Overweight/obesity related to past poor dietary habits and physical inactivity as evidenced by patient w/  recent sleeve gastrectomy surgery following dietary guidelines for continued weight loss.     Intervention:  Nutrition counseling provided.  Teaching Method Utilized:  Visual Auditory Hands on  Barriers to learning/adherence to lifestyle change: none  Demonstrated degree of understanding via:  Teach Back   Monitoring/Evaluation:  Dietary intake, exercise, and body weight. Follow up prn.

## 2016-02-03 NOTE — Patient Instructions (Addendum)
Goals:  Follow Phase 3B: High Protein + Non-Starchy Vegetables  Eat 3-6 small meals/snacks, every 3-5 hrs  Increase lean protein foods to meet 60g goal  Increase fluid intake to 64oz +  Avoid drinking 15 minutes before, during and 30 minutes after eating  Aim for >30 min of physical activity daily  Low impact machines, walking outside, free weights, "Down Dog" yoga app, Zova app  Set step goal to 6,000-8,000 steps per day  Continue limiting carbs  Use your protein shake only when you need them  Eat protein first, then vegetables, then carbs  Aim for less than 60 grams of carbs per day   Try Baritastic app for logging foods  Surgery date: 03/25/15 Surgery type: gastric sleeve [facility] (196 lbs) Weight today: 168.8 lbs Weight change: 6.2 lbs Total weight lost: 27.2 lbs  Goal weight: 139 lbs

## 2016-02-10 MED FILL — CITALOPRAM HBR 20 MG TABLET: 20 | 30 days supply | Qty: 30 | Fill #4

## 2016-02-10 MED FILL — TRIAMTERENE/HCTZ 37.5/25 TB: 37.5-25 | 30 days supply | Qty: 30 | Fill #0

## 2016-02-12 NOTE — Progress Notes (Signed)
TANITA BODY COMP RESULTS  03/03/15 04/08/15 05/20/15 02/03/16 02/12/16  BMI (kg/m^2) 37.4 34.9 33.6 N/A 32.2  Fat Mass (lbs) 91 79.0 73  63.4  Fat Free Mass (lbs) 103.5 102.5 102  106.8  Total Body Water (lbs) 76 75.0 74.5  75.4

## 2016-02-16 DIAGNOSIS — M7062 Trochanteric bursitis, left hip: Secondary | ICD-10-CM | POA: Diagnosis not present

## 2016-04-08 MED FILL — CITALOPRAM HBR 20 MG TABLET: 20 | 30 days supply | Qty: 30 | Fill #5

## 2016-04-09 ENCOUNTER — Other Ambulatory Visit: Payer: Self-pay

## 2016-04-09 NOTE — Patient Outreach (Signed)
[facility]) Care Management  04/09/2016  Brock RaLori J Leist Feb 10, 1966 [id number]  Phone call to schedule appointment.  No answer and message left.  Plan to send appointment request letter. Member last seen by [facility] was 04/17/15.  Appt request letter to be sent. Dudley MajorMelissa Prachi Oftedahl RN, [facility] Care Management Coordinator-[facility] Care [facility] [phone]

## 2016-05-24 ENCOUNTER — Other Ambulatory Visit: Payer: Self-pay

## 2016-05-24 DIAGNOSIS — R7303 Prediabetes: Secondary | ICD-10-CM

## 2016-05-24 NOTE — Patient Outreach (Signed)
[facility]) Care Management  05/24/2016  Kristine Stevens 1965-11-08 [id number]   Phone call from member as follow up from appt letter sent to member.   Member states that she has lost 30 lbs after her gastric sleeve surgery.  States she is no longer taking her blood sugars and she is not on any diabetes or cholesterol medications.  States she is exercising regular and watching what she is eating per her post surgery recomendations.  Discussed with member that since she is not on medications or checking blood sugars that she can be discharged from the [facility] program as she has met her goals.  Member agreeable to case closure.  Instructed that she can re-enroll in the future if needed.  Plan to close case as member has achieved her goals.  Peter Garter RN, [facility] Care Management Coordinator-[facility] [place] Management [phone]

## 2016-05-27 ENCOUNTER — Encounter: Payer: Self-pay | Admitting: Osteopathic Medicine

## 2016-05-27 ENCOUNTER — Ambulatory Visit (INDEPENDENT_AMBULATORY_CARE_PROVIDER_SITE_OTHER): Payer: 59 | Admitting: Osteopathic Medicine

## 2016-05-27 VITALS — BP 118/70 | HR 59 | Ht 61.5 in | Wt 171.0 lb

## 2016-05-27 DIAGNOSIS — Z9889 Other specified postprocedural states: Secondary | ICD-10-CM

## 2016-05-27 DIAGNOSIS — Z9884 Bariatric surgery status: Secondary | ICD-10-CM

## 2016-05-27 DIAGNOSIS — Z8759 Personal history of other complications of pregnancy, childbirth and the puerperium: Secondary | ICD-10-CM | POA: Insufficient documentation

## 2016-05-27 DIAGNOSIS — Z8639 Personal history of other endocrine, nutritional and metabolic disease: Secondary | ICD-10-CM | POA: Insufficient documentation

## 2016-05-27 DIAGNOSIS — Z8719 Personal history of other diseases of the digestive system: Secondary | ICD-10-CM

## 2016-05-27 DIAGNOSIS — Z8632 Personal history of gestational diabetes: Secondary | ICD-10-CM | POA: Insufficient documentation

## 2016-05-27 DIAGNOSIS — Z23 Encounter for immunization: Secondary | ICD-10-CM

## 2016-05-27 MED ORDER — CITALOPRAM HYDROBROMIDE 20 MG PO TABS: 20.0000 mg | ORAL_TABLET | Freq: Every day | ORAL | 3 refills | Status: DC

## 2016-05-27 MED FILL — CITALOPRAM HBR 20 MG TABLET: 20 | 90 days supply | Qty: 90 | Fill #0

## 2016-05-27 NOTE — Progress Notes (Signed)
HPI: Kristine Stevens is a 50 y.o. female  who presents to [facility] [place] today, 05/27/16,  for chief complaint of:  Chief Complaint  Patient presents with  . Establish Care    MEDICAL HISTORY REVIEWED:  DIABETES/PREDIABETES - Previously diagnosed with diabetes, underwent gastric bypass and is now diet controlled, last A1c was 5.8 one year ago, patient states recent labs done were good. History of gestational diabetes with first pregnancy  HYPERTENSION - previously on medications but was able to come off of these after successful weight loss from gastric bypass. History of gestational hypertension with her fourth pregnancy  PSYCHIATRIC - requests refill on Celexa, has been on this many years initially for PMS symptoms but found that it has helped with mood, particularly helpful with coping after death of 2 children. Would like to continue this medication.    Past medical, surgical, social and family history reviewed: Past Medical History:  Diagnosis Date  . GERD (gastroesophageal reflux disease)   . Heartburn   . Hyperlipidemia   . Hypertension   . PONV (postoperative nausea and vomiting)    severe  . Prediabetes   . Sphincter of Oddi dysfunction    Past Surgical History:  Procedure Laterality Date  . ABDOMINOPLASTY  05/25/2012  . CESAREAN SECTION  1991, 1998   x2  . CHOLECYSTECTOMY  1995  . IRRIGATION AND DEBRIDEMENT ABSCESS Right 05/22/2013   Procedure: MINOR INCISION AND DRAINAGE OF ABSCESS;  Surgeon: Nicki Reaper, MD;  Location: [facility];  Service: Orthopedics;  Laterality: Right;  . KNEE ARTHROSCOPY Left    x3  . LAPAROSCOPIC GASTRIC SLEEVE RESECTION N/A 03/25/2015   Procedure: LAPAROSCOPIC GASTRIC SLEEVE RESECTION UPPER ENDOSCOPY;  Surgeon: Luretha Murphy, MD;  Location: [facility];  Service: General;  Laterality: N/A;  . VAGINAL BIRTH AFTER CESAREAN SECTION  1993, 2001   x2   Social History  Substance Use Topics  . Smoking  status: Never Smoker  . Smokeless tobacco: Never Used  . Alcohol use No   Family History  Problem Relation Age of Onset  . Hypertension Mother   . Hyperlipidemia Mother   . Fibromyalgia Mother   . Hypertension Father      Current medication list and allergy/intolerance information reviewed:   Current Outpatient Prescriptions  Medication Sig Dispense Refill  . aspirin 81 MG tablet Take 81 mg by mouth daily.    . citalopram (CELEXA) 20 MG tablet Take 20 mg by mouth every evening.     . Pediatric Multiple Vit-C-FA (FLINSTONES GUMMIES OMEGA-3 DHA PO) Take 1 each by mouth 2 (two) times daily. Chewable tablet-not gummies     No current facility-administered medications for this visit.    No Known Allergies    Review of Systems:  Constitutional:  No  fever, no chills, No recent illness, No unintentional weight changes. No significant fatigue.   HEENT: No  headache, no vision change, no hearing change, No sore throat, No  sinus pressure  Cardiac: No  chest pain, No  pressure, No palpitations, No  Orthopnea  Respiratory:  No  shortness of breath. No  Cough  Gastrointestinal: No  abdominal pain, No  nausea, No  vomiting,  No  blood in stool, No  diarrhea, No  constipation   Musculoskeletal: No new myalgia/arthralgia  Genitourinary: No  incontinence, No  abnormal genital bleeding, No abnormal genital discharge  Skin: No  Rash, No other wounds/concerning lesions  Hem/Onc: No  easy bruising/bleeding, No  abnormal lymph node  Endocrine: No cold intolerance,  No heat intolerance. No polyuria/polydipsia/polyphagia   Neurologic: No  weakness, No  dizziness, No  slurred speech/focal weakness/facial droop  Psychiatric: No  concerns with depression, No  concerns with anxiety, No sleep problems, No mood problems  Exam:  BP 118/70   Pulse (!) 59   Ht 5' 1.5" (1.562 m)   Wt 171 lb (77.6 kg)   BMI 31.79 kg/m   Constitutional: VS see above. General Appearance: alert, well-developed,  well-nourished, NAD  Eyes: Normal lids and conjunctive, non-icteric sclera  Ears, Nose, Mouth, Throat: MMM, Normal external inspection ears/nares/mouth/lips/gums. TM normal bilaterally. Pharynx/tonsils no erythema, no exudate. Nasal mucosa normal.   Neck: No masses, trachea midline. No thyroid enlargement. No tenderness/mass appreciated. No lymphadenopathy  Respiratory: Normal respiratory effort. no wheeze, no rhonchi, no rales  Cardiovascular: S1/S2 normal, no murmur, no rub/gallop auscultated. RRR. No lower extremity edema. .  Gastrointestinal: Nontender, no masses. No hepatomegaly, no splenomegaly. No hernia appreciated. Bowel sounds normal. Rectal exam deferred.   Musculoskeletal: Gait normal. No clubbing/cyanosis of digits.   Neurological: Normal balance/coordination. No tremor.   Skin: warm, dry, intact. Marland Kitchen.    Psychiatric: Normal judgment/insight. Normal mood and affect. Oriented x3.     ASSESSMENT/PLAN: Request records from her most recent labs, no major concerns to address today. Patient advised to return at her convenience for annual physical exam/catch-up on preventive care measures.  History of gastric bypass  Need for prophylactic vaccination and inoculation against influenza - Plan: Flu Vaccine QUAD 36+ mos IM  History of acute pancreatitis  History of gestational hypertension  History of gestational diabetes  History of high cholesterol   Visit summary with medication list and pertinent instructions was printed for patient to review. All questions at time of visit were answered - patient instructed to contact office with any additional concerns. ER/RTC precautions were reviewed with the patient. Follow-up plan: Return sooner if needed, for at  your convenince [facility] .  Note: Total time spent 30 minutes, greater than 50% of the visit was spent face-to-face counseling and coordinating care for the following: The primary encounter diagnosis was History  of gastric bypass. Diagnoses of Need for prophylactic vaccination and inoculation against influenza, History of acute pancreatitis, History of gestational hypertension, History of gestational diabetes, and History of high cholesterol were also pertinent to this visit..Marland Kitchen

## 2016-06-11 DIAGNOSIS — Z903 Acquired absence of stomach [part of]: Secondary | ICD-10-CM | POA: Diagnosis not present

## 2016-06-17 ENCOUNTER — Encounter: Payer: Self-pay | Admitting: Osteopathic Medicine

## 2016-07-01 DIAGNOSIS — Z0120 Encounter for dental examination and cleaning without abnormal findings: Secondary | ICD-10-CM | POA: Diagnosis not present

## 2016-07-21 DIAGNOSIS — Z0120 Encounter for dental examination and cleaning without abnormal findings: Secondary | ICD-10-CM | POA: Diagnosis not present

## 2016-08-02 ENCOUNTER — Ambulatory Visit (INDEPENDENT_AMBULATORY_CARE_PROVIDER_SITE_OTHER): Payer: 59 | Admitting: Osteopathic Medicine

## 2016-08-02 ENCOUNTER — Encounter: Payer: Self-pay | Admitting: Osteopathic Medicine

## 2016-08-02 VITALS — BP 136/84 | HR 67 | Ht 61.5 in | Wt 173.0 lb

## 2016-08-02 DIAGNOSIS — M545 Low back pain, unspecified: Secondary | ICD-10-CM

## 2016-08-02 DIAGNOSIS — L259 Unspecified contact dermatitis, unspecified cause: Secondary | ICD-10-CM

## 2016-08-02 MED ORDER — TRIAMCINOLONE ACETONIDE 0.5 % EX CREA: 1.0000 "application " | TOPICAL_CREAM | Freq: Two times a day (BID) | CUTANEOUS | 1 refills | Status: DC

## 2016-08-02 MED ORDER — CYCLOBENZAPRINE HCL 10 MG PO TABS: 5.0000 mg | ORAL_TABLET | Freq: Three times a day (TID) | ORAL | 1 refills | Status: DC | PRN

## 2016-08-02 MED ORDER — OXYCODONE-ACETAMINOPHEN 5-325 MG PO TABS: 1.0000 | ORAL_TABLET | Freq: Four times a day (QID) | ORAL | 0 refills | Status: DC | PRN

## 2016-08-02 MED ORDER — DICLOFENAC SODIUM 75 MG PO TBEC: 75.0000 mg | DELAYED_RELEASE_TABLET | Freq: Two times a day (BID) | ORAL | 1 refills | Status: DC

## 2016-08-02 MED FILL — CYCLOBENZAPRINE 10 MG TAB: 10 | 10 days supply | Qty: 30 | Fill #0

## 2016-08-02 MED FILL — OXYCODONE/APAP 5/325 MG TAB: 5-325 | 2 days supply | Qty: 10 | Fill #0

## 2016-08-02 MED FILL — TRIAMCINOLONE 0.5% CREAM: 0.5 | 5 days supply | Qty: 15 | Fill #0

## 2016-08-02 MED FILL — DICLOFENAC SOD 75 MG TAB EC: 75 | 15 days supply | Qty: 30 | Fill #0

## 2016-08-02 NOTE — Progress Notes (Signed)
HPI: Kristine Stevens is a 50 y.o. female  who [facility], 08/02/16,  for chief complaint of:  Chief Complaint  Patient presents with  . Back Pain    BACK PAIN . Context: no injury she can recall  . Location: worse on L  . Quality: muscle spasm  . Duration: 3 days . Modifying factors: movement makes it worse. Has so far tried some leftover Robaxin and Tramadol, has felt nauseous all day after taking both of these . Assoc signs/symptoms: no sciatica  RASH:  . Location: front neck, streak at the base of the neck  . Quality: red, itching, dry . Duration: few days . 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 GERD (gastroesophageal reflux disease)   . Heartburn   . Hyperlipidemia   . Hypertension   . PONV (postoperative nausea and vomiting)    severe  . Prediabetes   . Sphincter of Oddi dysfunction    Past Surgical History:  Procedure Laterality Date  . ABDOMINOPLASTY  05/25/2012  . CESAREAN SECTION  1991, 1998   x2  . CHOLECYSTECTOMY  1995  . IRRIGATION AND DEBRIDEMENT ABSCESS Right 05/22/2013   Procedure: MINOR INCISION AND DRAINAGE OF ABSCESS;  Surgeon: Nicki ReaperGary R Kuzma, MD;  Location: [facility];  Service: Orthopedics;  Laterality: Right;  . KNEE ARTHROSCOPY Left    x3  . LAPAROSCOPIC GASTRIC SLEEVE RESECTION N/A 03/25/2015   Procedure: LAPAROSCOPIC GASTRIC SLEEVE RESECTION UPPER ENDOSCOPY;  Surgeon: Luretha MurphyMatthew Martin, MD;  Location: [facility];  Service: General;  Laterality: N/A;  . VAGINAL BIRTH AFTER CESAREAN SECTION  1993, 2001   x2   Social History  Substance Use Topics  . Smoking status: Never Smoker  . Smokeless tobacco: Never Used  . Alcohol use No   Family History  Problem Relation Age of Onset  . Hypertension Mother   . Hyperlipidemia Mother   . Fibromyalgia  Mother   . Hypertension Father      Current medication list and allergy/intolerance information reviewed:   Current Outpatient Prescriptions on File Prior to Visit  Medication Sig Dispense Refill  . aspirin 81 MG tablet Take 81 mg by mouth daily.    . citalopram (CELEXA) 20 MG tablet Take 1 tablet (20 mg total) by mouth daily. 90 tablet 3  . Pediatric Multiple Vit-C-FA (FLINSTONES GUMMIES OMEGA-3 DHA PO) Take 1 each by mouth 2 (two) times daily. Chewable tablet-not gummies     No current facility-administered medications on file prior to visit.    No Known Allergies    Review of Systems:  Constitutional: No recent illness  HEENT: No  headache, no vision change  Cardiac: No  chest pain, No  pressure, No palpitations  Respiratory:  No  shortness of breath. No  Cough  Gastrointestinal: No  abdominal pain, no change on bowel habits  Musculoskeletal: +new myalgia/arthralgia  Skin: +Rash  Hem/Onc: No  easy bruising/bleeding, No  abnormal lumps/bumps  Neurologic: No  weakness, No  Dizziness  Psychiatric: No  concerns with depression, No  concerns with anxiety  Exam:  BP 136/84   Pulse 67   Ht 5' 1.5" (1.562 m)   Wt 173 lb (78.5 kg)   BMI 32.16 kg/m   Constitutional: VS see above. General Appearance: alert, well-developed, well-nourished, NAD  Eyes: Normal lids and  conjunctive, non-icteric sclera  Ears, Nose, Mouth, Throat: MMM, Normal external inspection ears/nares/mouth/lips/gums.  Neck: No masses, trachea midline.   Respiratory: Normal respiratory effort. no wheeze, no rhonchi, no rales  Cardiovascular: S1/S2 normal, no murmur, no rub/gallop auscultated. RRR.   Musculoskeletal: Gait normal. Symmetric and independent movement of all extremities. Positive paraspinal tenderness to left lumbar spine/SI joint. No midline tenderness. Negative straight leg raise bilaterally.  Neurological: Normal balance/coordination. No tremor.  Skin: warm, dry, intact. Mild dry/not  daily, mildly red rash in linear 2 cm x 10 cm streak at base of L anterior neck c/w atopic more so than tinea or infectious, ?neckline of garment or ?necklace contact dermatitis  Psychiatric: Normal judgment/insight. Normal mood and affect. Oriented x3.      ASSESSMENT/PLAN:   Acute left-sided low back pain without sciatica - Plan: diclofenac (VOLTAREN) 75 MG EC tablet, cyclobenzaprine (FLEXERIL) 10 MG tablet, oxyCODONE-acetaminophen (PERCOCET/ROXICET) 5-325 MG tablet  Contact dermatitis, unspecified contact dermatitis type, unspecified trigger - Plan: triamcinolone cream (KENALOG) 0.5 %    Patient Instructions  For back pain: Prescription written for Voltaren nonsteroidal anti-inflammatory, this, or meloxicam, it should be a little bit easier on the stomach than ibuprofen. Would take this as directed for at least a week or so. Can add muscle relaxer Flexeril for moderate pain or Percocet as needed for severe pain. If pain does not improve with medications as well as home exercises, can return to the clinic in 1 week and we can try some manipulation techniques, or I can send referral for formal physical therapy.  For rash: Trial steroids, if no better in one week or so, let me know and we can schedule you to come back for reevaluation/biopsy, or we can place referral to dermatology.  Let me know if there is anything else we can do for you!    Visit summary with medication list and pertinent instructions was printed for patient to review. All questions at time of visit were answered - patient instructed to contact office with any additional concerns. ER/RTC precautions were reviewed with the patient. Follow-up plan: Return if symptoms worsen or fail to improve in 1 - 2 weeks.

## 2016-08-02 NOTE — Patient Instructions (Signed)
For back pain: Prescription written for Voltaren nonsteroidal anti-inflammatory, this, or meloxicam, it should be a little bit easier on the stomach than ibuprofen. Would take this as directed for at least a week or so. Can add muscle relaxer Flexeril for moderate pain or Percocet as needed for severe pain. If pain does not improve with medications as well as home exercises, can return to the clinic in 1 week and we can try some manipulation techniques, or I can send referral for formal physical therapy.  For rash: Trial steroids, if no better in one week or so, let me know and we can schedule you to come back for reevaluation/biopsy, or we can place referral to dermatology.  Let me know if there is anything else we can do for you!

## 2016-08-27 ENCOUNTER — Ambulatory Visit (INDEPENDENT_AMBULATORY_CARE_PROVIDER_SITE_OTHER): Payer: 59

## 2016-08-27 ENCOUNTER — Encounter: Payer: Self-pay | Admitting: Physician Assistant

## 2016-08-27 ENCOUNTER — Ambulatory Visit (INDEPENDENT_AMBULATORY_CARE_PROVIDER_SITE_OTHER): Payer: 59 | Admitting: Physician Assistant

## 2016-08-27 VITALS — BP 131/71 | HR 88 | Temp 99.1°F | Ht 61.5 in | Wt 171.0 lb

## 2016-08-27 DIAGNOSIS — J209 Acute bronchitis, unspecified: Secondary | ICD-10-CM | POA: Diagnosis not present

## 2016-08-27 DIAGNOSIS — J040 Acute laryngitis: Secondary | ICD-10-CM | POA: Diagnosis not present

## 2016-08-27 DIAGNOSIS — R05 Cough: Secondary | ICD-10-CM | POA: Diagnosis not present

## 2016-08-27 DIAGNOSIS — R059 Cough, unspecified: Secondary | ICD-10-CM

## 2016-08-27 DIAGNOSIS — M940 Chondrocostal junction syndrome [Tietze]: Secondary | ICD-10-CM | POA: Diagnosis not present

## 2016-08-27 MED ORDER — METHYLPREDNISOLONE SODIUM SUCC 125 MG IJ SOLR
125.0000 mg | Freq: Once | INTRAMUSCULAR | Status: AC
  Administered 2016-08-27: 125 mg via INTRAMUSCULAR

## 2016-08-27 MED ORDER — HYDROCOD POLST-CPM POLST ER 10-8 MG/5ML PO SUER: 5.0000 mL | Freq: Two times a day (BID) | ORAL | 0 refills | Status: DC | PRN

## 2016-08-27 MED ORDER — BENZONATATE 200 MG PO CAPS: 200.0000 mg | ORAL_CAPSULE | Freq: Two times a day (BID) | ORAL | 0 refills | Status: DC | PRN

## 2016-08-27 MED ORDER — METHYLPREDNISOLONE 4 MG PO TBPK: ORAL_TABLET | ORAL | 0 refills | Status: DC

## 2016-08-27 MED FILL — HYDROCODONE-CHLORPHENIRAM S: 10-8 | 12 days supply | Qty: 120 | Fill #0

## 2016-08-27 MED FILL — BENZONATATE 200 MG CAPSULE: 200 | 10 days supply | Qty: 20 | Fill #0

## 2016-08-27 MED FILL — METHYLPREDNISOLONE 4 MG TAB: 4 | 6 days supply | Qty: 21 | Fill #0

## 2016-08-27 NOTE — Progress Notes (Signed)
Subjective:    Patient ID: Kristine Stevens, female    DOB: 17-Feb-1966, 50 y.o.   MRN: [id number]  HPI  Pt is a 50 yo female who presents to the clinic with 2 days of low grade fever, chills, dry to wet cough, lost voice. She denies any wheezing. She denies any sinus pressure, ear pain, ST.  Her chest hurts every time she coughs. She has tried nyquil without any relief. Denies any muscle pain or body aches.   .. Active Ambulatory Problems    Diagnosis Date Noted  . Hyperlipemia 01/02/2015  . 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 Acid reflux 04/17/2015  . Dysfunction of sphincter of Oddi 08/16/2013  . Prediabetes 04/17/2015  . History of high cholesterol 05/27/2016  . History of gestational diabetes 05/27/2016  . History of gestational hypertension 05/27/2016  . History of acute pancreatitis 05/27/2016   Resolved Ambulatory Problems    Diagnosis Date Noted  . S/P laparoscopic sleeve gastrectomy 03/25/2015   Past Medical History:  Diagnosis Date  . GERD (gastroesophageal reflux disease)   . Heartburn   . Hyperlipidemia   . Hypertension   . PONV (postoperative nausea and vomiting)   . Prediabetes   . Sphincter of Oddi dysfunction    .Marland Kitchen. Family History  Problem Relation Age of Onset  . Hypertension Mother   . Hyperlipidemia Mother   . Fibromyalgia Mother   . Hypertension Father        Review of Systems  All other systems reviewed and are negative.      Objective:   Physical Exam  Constitutional: She is oriented to person, place, and time. She appears well-developed and well-nourished.  HENT:  Head: Normocephalic and atraumatic.  Right Ear: External ear normal.  Left Ear: External ear normal.  Nose: Nose normal.  Mouth/Throat: Oropharynx is clear and moist. No oropharyngeal exudate.  Negative TM's.    Eyes: Conjunctivae are normal. Right eye exhibits no discharge. Left eye exhibits no discharge.  Neck: Normal range of motion. Neck  supple.  Cardiovascular: Normal rate, regular rhythm and normal heart sounds.   Pulmonary/Chest:  Decreased effort due chest tightness. No wheezing or rhonchi.   Lymphadenopathy:    She has no cervical adenopathy.  Neurological: She is alert and oriented to person, place, and time.  Psychiatric: She has a normal mood and affect. Her behavior is normal.          Assessment & Plan:  Marland Kitchen.Marland Kitchen.Diagnoses and all orders for this visit:  Acute bronchitis, unspecified organism -     benzonatate (TESSALON) 200 MG capsule; Take 1 capsule (200 mg total) by mouth 2 (two) times daily as needed for cough. -     chlorpheniramine-HYDROcodone (TUSSIONEX) 10-8 MG/5ML SUER; Take 5 mLs by mouth every 12 (twelve) hours as needed for cough (cough, will cause drowsiness.).  Cough -     benzonatate (TESSALON) 200 MG capsule; Take 1 capsule (200 mg total) by mouth 2 (two) times daily as needed for cough. -     DG Chest 2 View -     chlorpheniramine-HYDROcodone (TUSSIONEX) 10-8 MG/5ML SUER; Take 5 mLs by mouth every 12 (twelve) hours as needed for cough (cough, will cause drowsiness.).  Laryngitis -     methylPREDNISolone (MEDROL DOSEPAK) 4 MG TBPK tablet; Take as directed by package insert.  Costochondritis -     methylPREDNISolone (MEDROL DOSEPAK) 4 MG TBPK tablet; Take as directed by package insert.   Discussed with patient  likely viral nature of this infection. I do believe she is coughing so hard that has create costochondritis.  duoneb given in clinic with little benefit.  Solumedrol 125mg  IM given today.  CXR to confirm no bacterial etiology.  Rest, hydrate.

## 2016-08-27 NOTE — Patient Instructions (Signed)

## 2016-08-27 NOTE — Progress Notes (Signed)
Call pt: lungs look great. No signs of bacterial infection.

## 2016-08-27 NOTE — Addendum Note (Signed)
Addended by: Donne AnonBENDER, Malayla Granberry L on: 08/27/2016 12:58 PM   Modules accepted: Orders

## 2016-09-16 IMAGING — DX DG CHEST 2V
2 series · 2 of 2 positions shown · non-contrast
Comparison: 08/13/2012

CLINICAL DATA: Preop gastric surgery.

EXAM:
CHEST  2 VIEW

[chest pa]
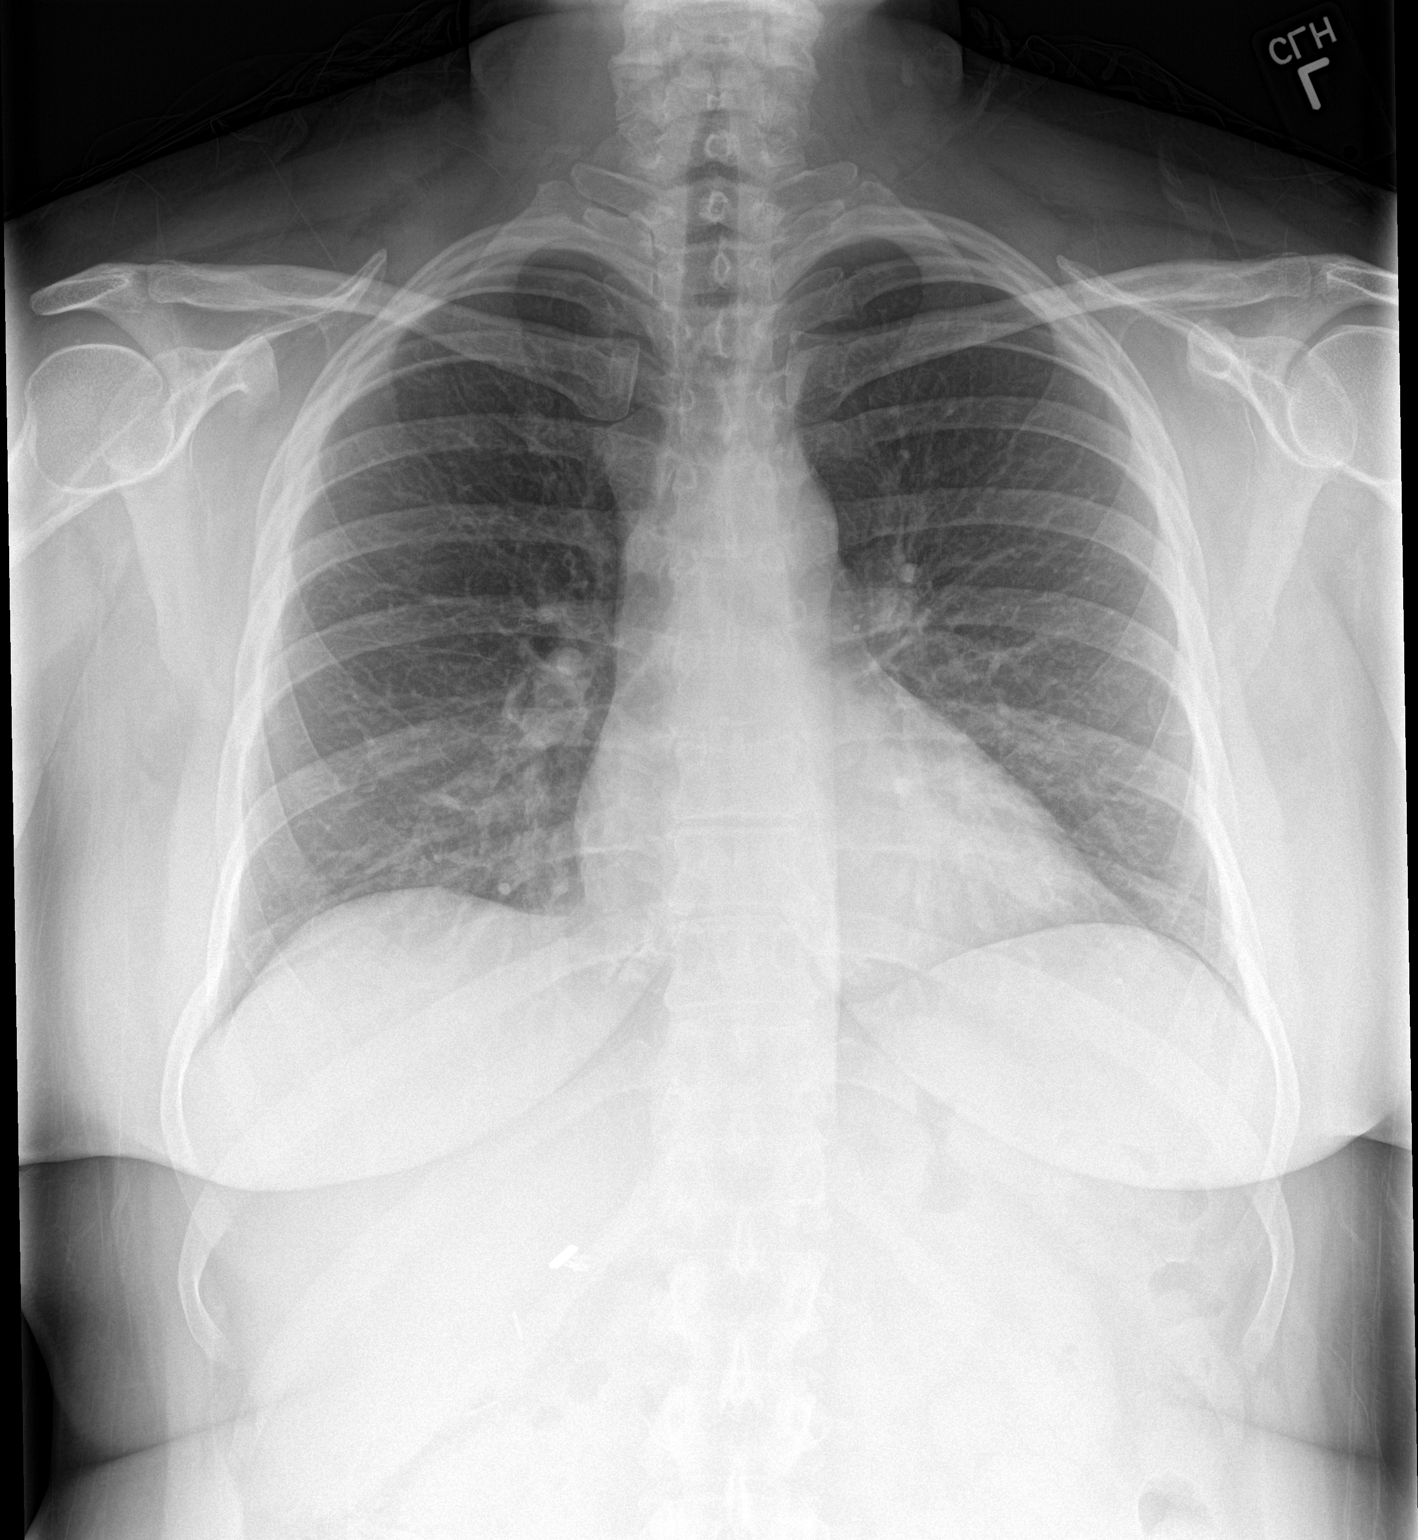

[chest lat]
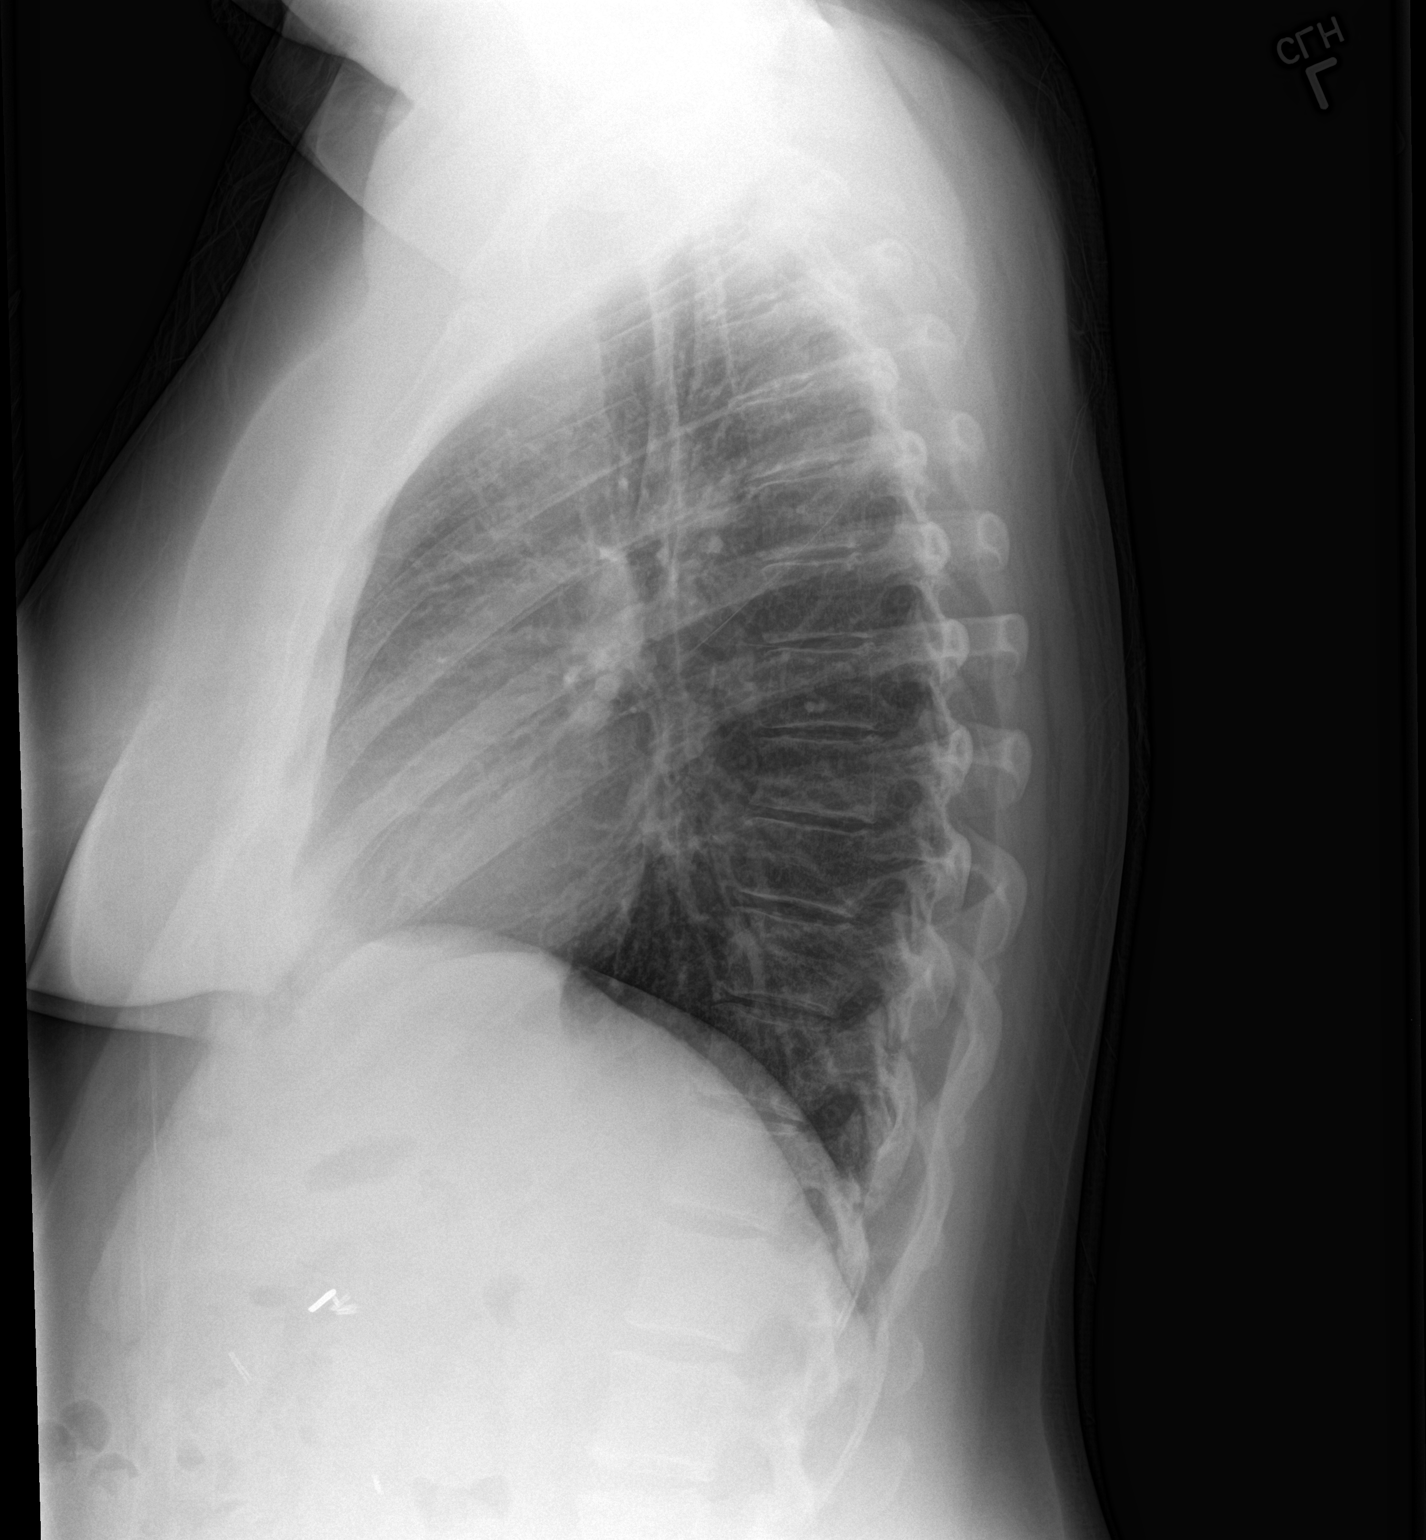

[2 of 2 positions shown; findings below may reference images not displayed]

FINDINGS: The heart size and mediastinal contours are within normal limits.
Both lungs are clear. The visualized skeletal structures are
unremarkable.
IMPRESSION: No active cardiopulmonary disease.

## 2016-11-04 MED FILL — CITALOPRAM HBR 20 MG TABLET: 20 | 90 days supply | Qty: 90 | Fill #1

## 2016-12-15 ENCOUNTER — Telehealth: Payer: 59 | Admitting: Family

## 2016-12-15 DIAGNOSIS — R6889 Other general symptoms and signs: Secondary | ICD-10-CM

## 2016-12-15 MED ORDER — OSELTAMIVIR PHOSPHATE 75 MG PO CAPS: 75.0000 mg | ORAL_CAPSULE | Freq: Two times a day (BID) | ORAL | 0 refills | Status: DC

## 2016-12-15 NOTE — Progress Notes (Signed)

## 2017-01-19 ENCOUNTER — Ambulatory Visit: Payer: 59 | Admitting: Osteopathic Medicine

## 2017-01-27 ENCOUNTER — Ambulatory Visit (INDEPENDENT_AMBULATORY_CARE_PROVIDER_SITE_OTHER): Payer: 59 | Admitting: Family Medicine

## 2017-01-27 ENCOUNTER — Encounter: Payer: Self-pay | Admitting: Family Medicine

## 2017-01-27 DIAGNOSIS — M79671 Pain in right foot: Secondary | ICD-10-CM

## 2017-01-27 MED ORDER — PANTOPRAZOLE SODIUM 40 MG PO TBEC: 40.0000 mg | DELAYED_RELEASE_TABLET | Freq: Every day | ORAL | 3 refills | Status: DC

## 2017-01-27 MED ORDER — MELOXICAM 15 MG PO TABS: 15.0000 mg | ORAL_TABLET | Freq: Every day | ORAL | 0 refills | Status: DC

## 2017-01-27 NOTE — Progress Notes (Signed)
Kristine Stevens is a 51 y.o. female who presents to [facility] today for right foot pain for the last two days after walking her dog.  It has remained constant.  She has taken advil which hasn't helped a ton.  Wearing shoes without tops (such as sandals) help her pain.  She denies any radiation or prior episodes.    Past Medical History:  Diagnosis Date  . GERD (gastroesophageal reflux disease)   . Heartburn   . Hyperlipidemia   . Hypertension   . PONV (postoperative nausea and vomiting)    severe  . Prediabetes   . Sphincter of Oddi dysfunction    Past Surgical History:  Procedure Laterality Date  . ABDOMINOPLASTY  05/25/2012  . CESAREAN SECTION  1991, 1998   x2  . CHOLECYSTECTOMY  1995  . IRRIGATION AND DEBRIDEMENT ABSCESS Right 05/22/2013   Procedure: MINOR INCISION AND DRAINAGE OF ABSCESS;  Surgeon: Nicki Reaper, MD;  Location: [facility];  Service: Orthopedics;  Laterality: Right;  . KNEE ARTHROSCOPY Left    x3  . LAPAROSCOPIC GASTRIC SLEEVE RESECTION N/A 03/25/2015   Procedure: LAPAROSCOPIC GASTRIC SLEEVE RESECTION UPPER ENDOSCOPY;  Surgeon: Luretha Murphy, MD;  Location: [facility];  Service: General;  Laterality: N/A;  . VAGINAL BIRTH AFTER CESAREAN SECTION  1993, 2001   x2   Social History  Substance Use Topics  . Smoking status: Never Smoker  . Smokeless tobacco: Never Used  . Alcohol use No     ROS:  As above   Medications: Current Outpatient Prescriptions  Medication Sig Dispense Refill  . aspirin 81 MG tablet Take 81 mg by mouth daily.    . citalopram (CELEXA) 20 MG tablet Take 1 tablet (20 mg total) by mouth daily. 90 tablet 3  . meloxicam (MOBIC) 15 MG tablet Take 1 tablet (15 mg total) by mouth daily. 14 tablet 0  . pantoprazole (PROTONIX) 40 MG tablet Take 1 tablet (40 mg total) by mouth daily. 30 tablet 3   No current facility-administered medications for this visit.    No Known  Allergies   Exam:  BP (!) 149/84   Pulse 74   Ht 5\' 1"  (1.549 m)   Wt 174 lb (78.9 kg)   BMI 32.88 kg/m  General: Well Developed, well nourished, and in no acute distress.  Neuro/Psych: Alert and oriented x3, extra-ocular muscles intact, able to move all 4 extremities, sensation grossly intact. Skin: Warm and dry, no rashes noted.  Respiratory: Not using accessory muscles, speaking in full sentences, trachea midline.  Cardiovascular: Pulses palpable, no extremity edema. Abdomen: Does not appear distended. MSK:  Right foot: Swollen and tender over the dorsal midfoot. Pain with resisted toe extension and with toe flexion. Pulses capillary refill sensation intact distally. Patient can walk without a limp. Normal ankle motion.    No results found for this or any previous visit (from the past 48 hour(s)). No results found.    Assessment and Plan: 51 y.o. female with right foot pain Foot pain and swelling. This is either a stress reaction or tendinitis or potentially stress fracture. We had a lengthy discussion about the potential options. Patient declined an x-ray that she's done some research and knows that immediately after an event like this is unlikely that a stress fracture will be apparent. I'm doubtful for true fracture. I think is reasonable to treat empirically. We will use a postop shoe. If this isn't sufficient to control her  pain patient has a cam walker boot at home that she continues. We'll recheck in a few weeks if still hurting and obtain an x-ray or eventually MRI as needed.   No orders of the defined types were placed in this encounter.   Discussed warning signs or symptoms. Please see discharge instructions. Patient expresses understanding.

## 2017-01-27 NOTE — Patient Instructions (Addendum)
Thank you for coming in today. Amber will get you in a post op shoe.  Take it easy for a few weeks.  Use meloxicam as needed daily.  Take protonix with meloxicam for gi protection.    Stress Fracture Stress fracture is a small break or crack in a bone. A stress fracture can be fully broken (complete) or partially broken (incomplete). The most common sites for stress fractures are the bones in the front of your feet (metatarsals), your heels (calcaneus), and the long bone of your lower leg (tibia). What are the causes? A stress fracture is caused by overuse or repetitive exercise, such as running. It happens when a bone cannot absorb any more shock because the muscles around it are weak. Stress fractures happen most commonly when:  You rapidly increase or start a new physical activity.  You use shoes that are worn out or do not fit you properly.  You exercise on a new surface. What increases the risk? You may be at higher risk for this type of fracture if:  You have a condition that causes weak bones (osteoporosis).  You are female. Stress fractures are more likely to occur in women. What are the signs or symptoms? The most common symptom of a stress fracture is feeling pain when you are using the affected part of your body. The pain usually goes away when you are resting. Other symptoms may include:  Swelling of the affected area.  Pain in the area when it is touched.  Decreased pain while resting. Stress fracture pain usually develops over time. How is this diagnosed? Diagnosis may include:  Medical history and physical exam.  X-rays.  Bone scan.  MRI. How is this treated? Treatment depends on the severity of your stress fracture. Treatment usually involves resting, icing, compression, and elevation (RICE) of the affected part of your body. Treatment may also include:  Medicines to reduce inflammation.  A cast or a walking shoe.  Crutches.  Surgery. Follow these  instructions at home: If you have a cast:   Do not stick anything inside the cast to scratch your skin. Doing that increases your risk of infection.  Check the skin around the cast every day. Report any concerns to your health care provider. You may put lotion on dry skin around the edges of the cast. Do not apply lotion to the skin underneath the cast.  Keep the cast clean and dry.  Cover the cast with a watertight plastic bag to protect it from water while you take a bath or a shower. Do not let the cast get wet.  Do not put pressure on any part of the cast until it is fully hardened. This may take several hours. If You Have a Walking Shoe:    Wear it as directed by your health care provider. Managing pain, stiffness, and swelling   If directed, apply ice to the injured area:  Put ice in a plastic bag.  Place a towel between your skin and the bag.  Leave the ice on for 20 minutes, 2-3 times per day.  Move your fingers or toes often to avoid stiffness and to lessen swelling.  Raise the injured area above the level of your heart while you are sitting or lying down. Activity   Rest as directed by your health care provider. Ask your health care provider if you may do alternative exercises, such as swimming or biking, while you are healing.  Return to your normal activities as  directed by your health care provider. Ask your health care provider what activities are safe for you.  Perform range-of-motion exercises only as directed by your health care provider. Safety   Do not use the injured limb to support yourbody weight until your health care provider says that you can. Use crutches if your health care provider tells you to do so. General instructions   Do not use any tobacco products, including cigarettes, chewing tobacco, or electronic cigarettes. Tobacco can delay bone healing. If you need help quitting, ask your health care provider.  Take medicines only as directed by  your health care provider.  Keep all follow-up visits as directed by your health care provider. This is important. How is this prevented?  Only wear shoes that:  Fit well.  Are not worn out.  Eat a healthy diet that contains vitamin D and calcium. This helps keeps your bones strong.  Be careful when you start a new physical activity. Give your body time to adjust.  Avoid doing only one kind of activity. Do different exercises, such as swimming and running, so that no single part of your body gets overused.  Do strength-training exercises. Contact a health care provider if:  Your pain gets worse.  You have new symptoms.  You have increased swelling. Get help right away if:  You lose feeling in the affected area. This information is not intended to replace advice given to you by your health care provider. Make sure you discuss any questions you have with your health care provider. Document Released: 12/04/2002 Document Revised: 05/12/2016 Document Reviewed: 04/18/2014 Elsevier Interactive Patient Education  2017 [employer].

## 2017-02-07 ENCOUNTER — Ambulatory Visit (INDEPENDENT_AMBULATORY_CARE_PROVIDER_SITE_OTHER): Payer: 59 | Admitting: Osteopathic Medicine

## 2017-02-07 ENCOUNTER — Encounter: Payer: Self-pay | Admitting: Osteopathic Medicine

## 2017-02-07 VITALS — BP 127/86 | HR 79 | Temp 98.1°F | Wt 174.0 lb

## 2017-02-07 DIAGNOSIS — J40 Bronchitis, not specified as acute or chronic: Secondary | ICD-10-CM

## 2017-02-07 MED ORDER — METHYLPREDNISOLONE 4 MG PO TBPK
ORAL_TABLET | ORAL | 0 refills | Status: DC
Start: 2017-02-07 — End: 2017-03-09

## 2017-02-07 MED ORDER — HYDROCODONE-HOMATROPINE 5-1.5 MG/5ML PO SYRP: 5.0000 mL | ORAL_SOLUTION | ORAL | 0 refills | Status: DC | PRN

## 2017-02-07 NOTE — Progress Notes (Signed)
HPI: Kristine Stevens is a 51 y.o. female who [facility] [place] [facility] for chief complaint of:  Chief Complaint  Patient presents with  . Cough   Acute Illness: . Context/duration: 5 days Ago was experiencing cough, about 3 days ago went hiking [place] her [place] the [place] and after that things seem to have gotten worse. . Location/Quality: Hacking cough, occasionally productive, no fever/chills, no sinus drainage, no sore throat . Assoc signs/symptoms: Hoarseness past few days   Past medical, social and family history reviewed.  Immune compromising conditions or other risk factors: none  Current medications and allergies reviewed.     Review of Systems:  Constitutional: No  fever/chills  HEENT: No  headache, No  sore throat, No  swollen glands  Cardiovascular: No chest pain  Respiratory:Yes  cough, No  shortness of breath  Gastrointestinal: No  nausea, No  vomiting,  No  diarrhea  Musculoskeletal:   No  myalgia/arthralgia  Skin/Integument:  No  rash   Detailed Exam:  BP 127/86   Pulse 79   Temp 98.1 F (36.7 C) (Oral)   Wt 174 lb (78.9 kg)   BMI 32.88 kg/m   Constitutional:   VSS, see above.   General Appearance: alert, well-developed, well-nourished, NAD  Eyes:   Normal lids and conjunctive, non-icteric sclera  Ears, Nose, Mouth, Throat:   Normal external inspection ears/nares  Normal mouth/lips/gums, MMM  normal TM  posterior pharynx without erythema, without exudate  nasal mucosa normal  Skin:  Normal inspection, no rash or concerning lesions noted on limited exam  Neck:   No masses, trachea midline. normal lymph nodes  Respiratory:   Normal respiratory effort.   Yes  wheeze/rhonchi/rales  Cardiovascular:   S1/S2 normal, no murmur/rub/gallop auscultated. RRR.   No results found for this or any previous visit (from the past 72 hour(s)). No results  found.   ASSESSMENT/PLAN:  Bronchitis - Plan: methylPREDNISolone (MEDROL DOSEPAK) 4 MG TBPK tablet, HYDROcodone-homatropine (HYCODAN) 5-1.5 MG/5ML syrup    Patient Instructions  I think your symptoms are most likely due to viral bronchitis. Cough with this illness can be severe but should get better after about 7-10 days, cough may linger a bit longer than that but typically antibiotics are not required. Symptomatic management with cough medication and steroids as well as over-the-counter/home remedies as noted below. If cough is persisting beyond 7-10 days, please call the office and we will discuss antibiotic use or chest Xray. Let us know if you need a note to miss work.     Visit summary was printed for the patient with medications and pertinent instructions for patient to review. ER/RTC precautions reviewed. All questions answered. Return if symptoms worsen or fail to improve.

## 2017-02-07 NOTE — Patient Instructions (Addendum)
I think your symptoms are most likely due to viral bronchitis. Cough with this illness can be severe but should get better after about 7-10 days, cough may linger a bit longer than that but typically antibiotics are not required. Symptomatic management with cough medication and steroids as well as over-the-counter/home remedies as noted below. If cough is persisting beyond 7-10 days, please call the office and we will discuss antibiotic use or chest Xray. Let us know if you need a note to miss work.

## 2017-02-14 DIAGNOSIS — Z0120 Encounter for dental examination and cleaning without abnormal findings: Secondary | ICD-10-CM | POA: Diagnosis not present

## 2017-03-01 DIAGNOSIS — H16223 Keratoconjunctivitis sicca, not specified as Sjogren's, bilateral: Secondary | ICD-10-CM | POA: Diagnosis not present

## 2017-03-01 DIAGNOSIS — H52223 Regular astigmatism, bilateral: Secondary | ICD-10-CM | POA: Diagnosis not present

## 2017-03-01 DIAGNOSIS — H5203 Hypermetropia, bilateral: Secondary | ICD-10-CM | POA: Diagnosis not present

## 2017-03-01 DIAGNOSIS — H524 Presbyopia: Secondary | ICD-10-CM | POA: Diagnosis not present

## 2017-03-01 DIAGNOSIS — H179 Unspecified corneal scar and opacity: Secondary | ICD-10-CM | POA: Diagnosis not present

## 2017-03-02 MED FILL — RESTASIS MULTIDOSE 0.05% EY: 0.05 | 30 days supply | Qty: 6 | Fill #0

## 2017-03-09 ENCOUNTER — Encounter: Payer: Self-pay | Admitting: Osteopathic Medicine

## 2017-03-09 ENCOUNTER — Ambulatory Visit (INDEPENDENT_AMBULATORY_CARE_PROVIDER_SITE_OTHER): Payer: 59 | Admitting: Osteopathic Medicine

## 2017-03-09 ENCOUNTER — Encounter: Payer: Self-pay | Admitting: Gastroenterology

## 2017-03-09 VITALS — BP 140/88 | HR 73 | Ht 61.0 in | Wt 170.0 lb

## 2017-03-09 DIAGNOSIS — Z131 Encounter for screening for diabetes mellitus: Secondary | ICD-10-CM | POA: Diagnosis not present

## 2017-03-09 DIAGNOSIS — Z0000 Encounter for general adult medical examination without abnormal findings: Secondary | ICD-10-CM | POA: Diagnosis not present

## 2017-03-09 DIAGNOSIS — Z1239 Encounter for other screening for malignant neoplasm of breast: Secondary | ICD-10-CM

## 2017-03-09 DIAGNOSIS — Z1211 Encounter for screening for malignant neoplasm of colon: Secondary | ICD-10-CM

## 2017-03-09 DIAGNOSIS — Z1231 Encounter for screening mammogram for malignant neoplasm of breast: Secondary | ICD-10-CM

## 2017-03-09 DIAGNOSIS — Z13220 Encounter for screening for lipoid disorders: Secondary | ICD-10-CM | POA: Diagnosis not present

## 2017-03-09 LAB — CBC
HEMATOCRIT: 41.8 % (ref 35.0–45.0)
Hemoglobin: 14 g/dL (ref 11.7–15.5)
MCH: 29.8 pg (ref 27.0–33.0)
MCHC: 33.5 g/dL (ref 32.0–36.0)
MCV: 88.9 fL (ref 80.0–100.0)
MPV: 8.9 fL (ref 7.5–12.5)
PLATELETS: 309 10*3/uL (ref 140–400)
RBC: 4.7 MIL/uL (ref 3.80–5.10)
RDW: 14.6 % (ref 11.0–15.0)
WBC: 7.5 10*3/uL (ref 3.8–10.8)

## 2017-03-09 NOTE — Progress Notes (Signed)
HPI: Kristine Stevens is a 51 y.o. female  who [facility] [place], [facility],  for chief complaint of:  Chief Complaint  Patient presents with  . Annual Exam    Preventive care: Patient needs form filled out for school, review vaccine records, make sure TB negative. Patient is very had some of this done at employee health and presents records which were reviewed, see scanned documents for full details.   Otherwise, patient feeling well today and has no complaints.  Past medical, surgical, social and family history reviewed: Patient Active Problem List   Diagnosis Date Noted  . Right foot pain 01/27/2017  . History of high cholesterol 05/27/2016  . History of gestational diabetes 05/27/2016  . History of gestational hypertension 05/27/2016  . History of acute pancreatitis 05/27/2016  . Acid reflux 04/17/2015  . Prediabetes 04/17/2015  . 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 Hyperlipemia 01/02/2015  . Dysfunction of sphincter of Oddi 08/16/2013   Past Surgical History:  Procedure Laterality Date  . ABDOMINOPLASTY  05/25/2012  . CESAREAN SECTION  1991, 1998   x2  . CHOLECYSTECTOMY  1995  . IRRIGATION AND DEBRIDEMENT ABSCESS Right 05/22/2013   Procedure: MINOR INCISION AND DRAINAGE OF ABSCESS;  Surgeon: Nicki ReaperGary R Kuzma, MD;  Location: [facility];  Service: Orthopedics;  Laterality: Right;  . KNEE ARTHROSCOPY Left    x3  . LAPAROSCOPIC GASTRIC SLEEVE RESECTION N/A 03/25/2015   Procedure: LAPAROSCOPIC GASTRIC SLEEVE RESECTION UPPER ENDOSCOPY;  Surgeon: Luretha MurphyMatthew Martin, MD;  Location: [facility];  Service: General;  Laterality: N/A;  . VAGINAL BIRTH AFTER CESAREAN SECTION  1993, 2001   x2   Social History  Substance Use Topics  . Smoking status: Never Smoker  . Smokeless tobacco: Never Used  . Alcohol use No   Family History  Problem Relation Age of Onset  . Hypertension Mother   . Hyperlipidemia  Mother   . Fibromyalgia Mother   . Hypertension Father      Current medication list and allergy/intolerance information reviewed:   Current Outpatient Prescriptions  Medication Sig Dispense Refill  . aspirin 81 MG tablet Take 81 mg by mouth daily.    . citalopram (CELEXA) 20 MG tablet Take 1 tablet (20 mg total) by mouth daily. 90 tablet 3  . HYDROcodone-homatropine (HYCODAN) 5-1.5 MG/5ML syrup Take 5 mLs by mouth every 4 (four) hours as needed for cough. 120 mL 0  . methylPREDNISolone (MEDROL DOSEPAK) 4 MG TBPK tablet 6-day pack as directed 21 tablet 0   No current facility-administered medications for this visit.    No Known Allergies    Review of Systems:  Constitutional:  No  fever, no chills, No recent illness.   HEENT: No  headache, no vision change  Cardiac: No  chest pain, No  pressure, No palpitations, No  Orthopnea  Respiratory:  No  shortness of breath. No  Cough  Gastrointestinal: No  abdominal pain, No  nausea  Musculoskeletal: No new myalgia/arthralgia  Skin: No  Rash  Neurologic: No  weakness, No  dizziness  Psychiatric: No  concerns with depression, No  concerns with anxiety  Exam:  BP 140/88   Pulse 73   Ht 5\' 1"  (1.549 m)   Wt 170 lb (77.1 kg)   BMI 32.12 kg/m   Constitutional: VS see above. General Appearance: alert, well-developed, well-nourished, NAD  Eyes: Normal lids and conjunctive, non-icteric sclera  Ears, Nose, Mouth, Throat: MMM, Normal  external inspection ears/nares/mouth/lips/gums. TM normal bilaterally. Pharynx/tonsils no erythema, no exudate. Nasal mucosa normal.   Neck: No masses, trachea midline. No thyroid enlargement. No tenderness/mass appreciated. No lymphadenopathy  Respiratory: Normal respiratory effort. no wheeze, no rhonchi, no rales  Cardiovascular: S1/S2 normal, no murmur, no rub/gallop auscultated. RRR. No lower extremity edema.   Gastrointestinal: Nontender, no masses. No hepatomegaly, no splenomegaly. No hernia  appreciated. Bowel sounds normal. Rectal exam deferred.   Musculoskeletal: Gait normal. No clubbing/cyanosis of digits.   Neurological: Normal balance/coordination. No tremor. No cranial nerve deficit on limited exam. Motor and sensation intact and symmetric. Cerebellar reflexes intact.   Skin: warm, dry, intact. No rash/ulcer. No concerning nevi or subq nodules on limited exam.    Psychiatric: Normal judgment/insight. Normal mood and affect. Oriented x3.    No results found for this or any previous visit (from the past 24 hour(s)).   ASSESSMENT/PLAN:   Annual physical exam - Plan: CBC, COMPLETE METABOLIC PANEL WITH GFR, Lipid panel, TSH, Hemoglobin A1c, Urinalysis  Colon cancer screening - Plan: Ambulatory referral to Gastroenterology  Breast cancer screening - Plan: MM DIGITAL SCREENING BILATERAL  Screening for diabetes mellitus - Plan: Hemoglobin A1c  Screening, lipid - Plan: Lipid panel    Immunization History  Administered Date(s) Administered  . Influenza,inj,Quad PF,36+ Mos 05/27/2016   FEMALE PREVENTIVE CARE Updated 03/09/17   ANNUAL SCREENING/COUNSELING  Diet/Exercise - HEALTHY HABITS DISCUSSED TO DECREASE CV RISK History  Smoking Status  . Never Smoker  Smokeless Tobacco  . Never Used   History  Alcohol Use No   Depression screen PHQ 2/9 02/03/2016  Decreased Interest 0  Down, Depressed, Hopeless 0  PHQ - 2 Score 0    Domestic violence concerns - no  HTN SCREENING - SEE VITALS  SEXUAL HEALTH  Sexually active in the past year - Yes with female.  Need/want STI testing today? - no  Concerns about libido or pain with sex? - no  Plans for pregnancy? - none  INFECTIOUS DISEASE SCREENING  HIV - declines and low risk   GC/CT - does not need  HepC - DOB [birth year] - does not need  TB - does not need  DISEASE SCREENING  Lipid - needs  DM2 - needs  Osteoporosis - women age 68+ - does not need  CANCER SCREENING  Cervical - needs - WILL  RTC  Breast - needs - ORDERED   Lung - does not need  Colon - needs - REFER TO GI  ADULT VACCINATION  Influenza - annual vaccine recommended  Td - booster every 10 years - has had at employee health   Zoster - option at 67, yes at 60+   PCV13 - was not indicated  PPSV23 - was not indicated Immunization History  Administered Date(s) Administered  . Influenza,inj,Quad PF,36+ Mos 05/27/2016       There are no Patient Instructions on file for this visit.   Visit summary with medication list and pertinent instructions was printed for patient to review. All questions at time of visit were answered - patient instructed to contact office with any additional concerns. ER/RTC precautions were reviewed with the patient. Follow-up plan: Return in about 1 year (around 03/09/2018) for annual physical if labs are ok, see me sooner if needed! Marland Kitchen

## 2017-03-10 LAB — LIPID PANEL
CHOLESTEROL: 244 mg/dL — AB (ref <200)
HDL: 58 mg/dL (ref >50)
LDL Cholesterol: 165 mg/dL — ABNORMAL HIGH (ref <100)
TRIGLYCERIDES: 105 mg/dL (ref <150)
Total CHOL/HDL Ratio: 4.2 Ratio (ref <5.0)
VLDL: 21 mg/dL (ref <30)

## 2017-03-10 LAB — HEMOGLOBIN A1C
HEMOGLOBIN A1C: 5.6 % (ref <5.7)
MEAN PLASMA GLUCOSE: 114 mg/dL

## 2017-03-10 LAB — COMPLETE METABOLIC PANEL WITH GFR
ALT: 12 U/L (ref 6–29)
AST: 15 U/L (ref 10–35)
Albumin: 4.2 g/dL (ref 3.6–5.1)
Alkaline Phosphatase: 66 U/L (ref 33–130)
BILIRUBIN TOTAL: 0.4 mg/dL (ref 0.2–1.2)
BUN: 8 mg/dL (ref 7–25)
CALCIUM: 8.9 mg/dL (ref 8.6–10.4)
CHLORIDE: 103 mmol/L (ref 98–110)
CO2: 25 mmol/L (ref 20–31)
CREATININE: 0.66 mg/dL (ref 0.50–1.05)
Glucose, Bld: 96 mg/dL (ref 65–99)
Potassium: 3.9 mmol/L (ref 3.5–5.3)
Sodium: 138 mmol/L (ref 135–146)
Total Protein: 6.7 g/dL (ref 6.1–8.1)

## 2017-03-10 LAB — URINALYSIS
Bilirubin Urine: NEGATIVE
Glucose, UA: NEGATIVE
HGB URINE DIPSTICK: NEGATIVE
Leukocytes, UA: NEGATIVE
NITRITE: NEGATIVE
PH: 6.5 (ref 5.0–8.0)
Protein, ur: NEGATIVE
SPECIFIC GRAVITY, URINE: 1.022 (ref 1.001–1.035)

## 2017-03-10 LAB — TSH: TSH: 1.19 mIU/L

## 2017-03-22 ENCOUNTER — Other Ambulatory Visit: Payer: Self-pay | Admitting: Osteopathic Medicine

## 2017-03-22 DIAGNOSIS — Z780 Asymptomatic menopausal state: Secondary | ICD-10-CM

## 2017-03-23 ENCOUNTER — Ambulatory Visit (INDEPENDENT_AMBULATORY_CARE_PROVIDER_SITE_OTHER): Payer: 59

## 2017-03-23 ENCOUNTER — Ambulatory Visit: Payer: 59

## 2017-03-23 DIAGNOSIS — Z1231 Encounter for screening mammogram for malignant neoplasm of breast: Secondary | ICD-10-CM

## 2017-03-23 DIAGNOSIS — Z780 Asymptomatic menopausal state: Secondary | ICD-10-CM

## 2017-04-28 MED FILL — CITALOPRAM HBR 20 MG TABLET: 20 | 90 days supply | Qty: 90 | Fill #2

## 2017-05-04 ENCOUNTER — Ambulatory Visit (AMBULATORY_SURGERY_CENTER): Payer: Self-pay

## 2017-05-04 VITALS — Ht 61.0 in | Wt 173.8 lb

## 2017-05-04 DIAGNOSIS — Z1211 Encounter for screening for malignant neoplasm of colon: Secondary | ICD-10-CM

## 2017-05-04 MED ORDER — NA SULFATE-K SULFATE-MG SULF 17.5-3.13-1.6 GM/177ML PO SOLN: ORAL | 0 refills | Status: DC

## 2017-05-05 NOTE — Progress Notes (Signed)
PV done on 05-04-17 per Ulis RiasJanis Breece RN; information entered into computer today d/t [employer] being down yesterday.  Instructions mailed to patient

## 2017-05-11 ENCOUNTER — Encounter: Payer: Self-pay | Admitting: Gastroenterology

## 2017-05-25 ENCOUNTER — Encounter: Payer: Self-pay | Admitting: Gastroenterology

## 2017-05-25 ENCOUNTER — Ambulatory Visit (AMBULATORY_SURGERY_CENTER): Payer: 59 | Admitting: Gastroenterology

## 2017-05-25 VITALS — BP 126/76 | HR 70 | Temp 98.0°F | Resp 15 | Ht 61.0 in | Wt 173.0 lb

## 2017-05-25 DIAGNOSIS — Z1212 Encounter for screening for malignant neoplasm of rectum: Secondary | ICD-10-CM | POA: Diagnosis not present

## 2017-05-25 DIAGNOSIS — F329 Major depressive disorder, single episode, unspecified: Secondary | ICD-10-CM | POA: Diagnosis not present

## 2017-05-25 DIAGNOSIS — Z1211 Encounter for screening for malignant neoplasm of colon: Secondary | ICD-10-CM

## 2017-05-25 MED ORDER — SODIUM CHLORIDE 0.9 % IV SOLN: 500.0000 mL | INTRAVENOUS | Status: DC

## 2017-05-25 NOTE — Progress Notes (Signed)
Report to PACU, RN, vss, BBS= Clear.  

## 2017-05-25 NOTE — Op Note (Signed)
[facility] Patient Name: Kristine Stevens Procedure Date: 05/25/2017 10:06 AM MRN: [id number] Endoscopist: Rachael Fee , MD Age: 51 Referring MD:  Date of Birth: [birth year] Gender: Female Account #: [id number] Procedure:                Colonoscopy Indications:              Screening for colorectal malignant neoplasm Medicines:                Monitored Anesthesia Care Procedure:                Pre-Anesthesia Assessment:                           - Prior to the procedure, a History and Physical                            was performed, and patient medications and                            allergies were reviewed. The patient's tolerance of                            previous anesthesia was also reviewed. The risks                            and benefits of the procedure and the sedation                            options and risks were discussed with the patient.                            All questions were answered, and informed consent                            was obtained. Prior Anticoagulants: The patient has                            taken no previous anticoagulant or antiplatelet                            agents. ASA Grade Assessment: II - A patient with                            mild systemic disease. After reviewing the risks                            and benefits, the patient was deemed in                            satisfactory condition to undergo the procedure.                           After obtaining informed consent, the colonoscope  was passed under direct vision. Throughout the                            procedure, the patient's blood pressure, pulse, and                            oxygen saturations were monitored continuously. The                            Colonoscope was introduced through the anus and                            advanced to the the cecum, identified by                            appendiceal orifice and  ileocecal valve. The                            colonoscopy was performed without difficulty. The                            patient tolerated the procedure well. The quality                            of the bowel preparation was good. The ileocecal                            valve, appendiceal orifice, and rectum were                            photographed. Scope In: 10:19:01 AM Scope Out: 10:30:44 AM Scope Withdrawal Time: 0 hours 8 minutes 36 seconds  Total Procedure Duration: 0 hours 11 minutes 43 seconds  Findings:                 The entire examined colon appeared normal on direct                            and retroflexion views. Complications:            No immediate complications. Estimated blood loss:                            None. Estimated Blood Loss:     Estimated blood loss: none. Impression:               - The entire examined colon is normal on direct and                            retroflexion views.                           - No polyps or cancers. Recommendation:           - Patient has a contact number available for  emergencies. The signs and symptoms of potential                            delayed complications were discussed with the                            patient. Return to normal activities tomorrow.                            Written discharge instructions were provided to the                            patient.                           - Resume previous diet.                           - Continue present medications.                           - Repeat colonoscopy in 10 years for screening                            purposes. Rachael Fee, MD 05/25/2017 10:33:48 AM This report has been signed electronically.

## 2017-05-25 NOTE — Patient Instructions (Signed)
Discharge instructions given. Normal exam. Resume previous medications. YOU HAD AN ENDOSCOPIC PROCEDURE TODAY AT THE [facility]:   Refer to the procedure report that was given to you for any specific questions about what was found during the examination.  If the procedure report does not answer your questions, please call your gastroenterologist to clarify.  If you requested that your care partner not be given the details of your procedure findings, then the procedure report has been included in a sealed envelope for you to review at your convenience later.  YOU SHOULD EXPECT: Some feelings of bloating in the abdomen. Passage of more gas than usual.  Walking can help get rid of the air that was put into your GI tract during the procedure and reduce the bloating. If you had a lower endoscopy (such as a colonoscopy or flexible sigmoidoscopy) you may notice spotting of blood in your stool or on the toilet paper. If you underwent a bowel prep for your procedure, you may not have a normal bowel movement for a few days.  Please Note:  You might notice some irritation and congestion in your nose or some drainage.  This is from the oxygen used during your procedure.  There is no need for concern and it should clear up in a day or so.  SYMPTOMS TO REPORT IMMEDIATELY:   Following lower endoscopy (colonoscopy or flexible sigmoidoscopy):  Excessive amounts of blood in the stool  Significant tenderness or worsening of abdominal pains  Swelling of the abdomen that is new, acute  Fever of 100F or higher   For urgent or emergent issues, a gastroenterologist can be reached at any hour by calling [phone].   DIET:  We do recommend a small meal at first, but then you may proceed to your regular diet.  Drink plenty of fluids but you should avoid alcoholic beverages for 24 hours.  ACTIVITY:  You should plan to take it easy for the rest of today and you should NOT DRIVE or use heavy machinery  until tomorrow (because of the sedation medicines used during the test).    FOLLOW UP: Our staff will call the number listed on your records the next business day following your procedure to check on you and address any questions or concerns that you may have regarding the information given to you following your procedure. If we do not reach you, we will leave a message.  However, if you are feeling well and you are not experiencing any problems, there is no need to return our call.  We will assume that you have returned to your regular daily activities without incident.  If any biopsies were taken you will be contacted by phone or by letter within the next 1-3 weeks.  Please call us at [phone] if you have not heard about the biopsies in 3 weeks.    SIGNATURES/CONFIDENTIALITY: You and/or your care partner have signed paperwork which will be entered into your electronic medical record.  These signatures attest to the fact that that the information above on your After Visit Summary has been reviewed and is understood.  Full responsibility of the confidentiality of this discharge information lies with you and/or your care-partner. 

## 2017-05-26 ENCOUNTER — Telehealth: Payer: Self-pay | Admitting: *Deleted

## 2017-05-26 NOTE — Telephone Encounter (Signed)
  Follow up Call-  Call back number 05/25/2017  Post procedure Call Back phone  # [phone]  Permission to leave phone message Yes  Some recent data might be hidden     Patient questions:  Do you have a fever, pain , or abdominal swelling? No. Pain Score  0 *  Have you tolerated food without any problems? Yes.    Have you been able to return to your normal activities? Yes.    Do you have any questions about your discharge instructions: Diet   No. Medications  No. Follow up visit  No.  Do you have questions or concerns about your Care? No.  Actions: * If pain score is 4 or above: No action needed, pain <4.  States she has some tenderness to lower abdomen, eating without difficulty.  Pt states discomfort is getting better.

## 2017-05-26 NOTE — Telephone Encounter (Signed)
  Follow up Call-  Call back number 05/25/2017  Post procedure Call Back phone  # [phone]  Permission to leave phone message Yes  Some recent data might be hidden    [facility]

## 2017-08-24 DIAGNOSIS — Z0120 Encounter for dental examination and cleaning without abnormal findings: Secondary | ICD-10-CM | POA: Diagnosis not present

## 2017-08-28 ENCOUNTER — Encounter: Payer: Self-pay | Admitting: Osteopathic Medicine

## 2017-08-30 ENCOUNTER — Ambulatory Visit (INDEPENDENT_AMBULATORY_CARE_PROVIDER_SITE_OTHER): Payer: 59 | Admitting: Osteopathic Medicine

## 2017-08-30 ENCOUNTER — Encounter: Payer: Self-pay | Admitting: Osteopathic Medicine

## 2017-08-30 VITALS — BP 142/86 | HR 68 | Temp 98.5°F | Wt 178.0 lb

## 2017-08-30 DIAGNOSIS — N6320 Unspecified lump in the left breast, unspecified quadrant: Secondary | ICD-10-CM | POA: Diagnosis not present

## 2017-08-30 NOTE — Progress Notes (Signed)
HPI: Kristine Stevens is a 51 y.o. female who  has a past medical history of GERD (gastroesophageal reflux disease), Heartburn, Hyperlipidemia, Hypertension, IBS (irritable bowel syndrome), PONV (postoperative nausea and vomiting), Prediabetes, and Sphincter of Oddi dysfunction.  she presents to [facility] today, 08/30/17,  for chief complaint of:  Chief Complaint  Patient presents with  . Breast Problem    Pt stated Lt upper breast have a knot.    . Context: screening mammo 03/23/17 was normal . Location: L upper side  . Quality: knot, nonpainful . Duration: noticed last week     Past medical, surgical, social and family history reviewed:  Patient Active Problem List   Diagnosis Date Noted  . Right foot pain 01/27/2017  . History of high cholesterol 05/27/2016  . History of gestational diabetes 05/27/2016  . History of gestational hypertension 05/27/2016  . History of acute pancreatitis 05/27/2016  . Acid reflux 04/17/2015  . Prediabetes 04/17/2015  . 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 Hyperlipemia 01/02/2015  . Dysfunction of sphincter of Oddi 08/16/2013    Past Surgical History:  Procedure Laterality Date  . ABDOMINOPLASTY  05/25/2012  . CESAREAN SECTION  1991, 1998   x2  . CHOLECYSTECTOMY  1995  . IRRIGATION AND DEBRIDEMENT ABSCESS Right 05/22/2013   Procedure: MINOR INCISION AND DRAINAGE OF ABSCESS;  Surgeon: Nicki ReaperGary R Kuzma, MD;  Location: [facility];  Service: Orthopedics;  Laterality: Right;  . KNEE ARTHROSCOPY Left    x3  . LAPAROSCOPIC GASTRIC SLEEVE RESECTION N/A 03/25/2015   Procedure: LAPAROSCOPIC GASTRIC SLEEVE RESECTION UPPER ENDOSCOPY;  Surgeon: Luretha MurphyMatthew Martin, MD;  Location: [facility];  Service: General;  Laterality: N/A;  . VAGINAL BIRTH AFTER CESAREAN SECTION  1993, 2001   x2    Social History   Tobacco Use  . Smoking status: Never Smoker  . Smokeless tobacco: Never Used   Substance Use Topics  . Alcohol use: Yes    Comment: rare    Family History  Problem Relation Age of Onset  . Hypertension Mother   . Hyperlipidemia Mother   . Fibromyalgia Mother   . Hypertension Father   . Colon cancer Neg Hx   . Esophageal cancer Neg Hx   . Rectal cancer Neg Hx   . Stomach cancer Neg Hx      Current medication list and allergy/intolerance information reviewed:    Current Outpatient Medications  Medication Sig Dispense Refill  . citalopram (CELEXA) 20 MG tablet Take 1 tablet (20 mg total) by mouth daily. 90 tablet 3  . Multiple Vitamins-Minerals (MULTIVITAMIN ADULT PO) Take by mouth daily.    Marland Kitchen. aspirin 81 MG tablet Take 81 mg by mouth daily.    . Omega-3 Krill Oil 500 MG CAPS Take 1,200 mg by mouth daily.     Current Facility-Administered Medications  Medication Dose Route Frequency Provider Last Rate Last Dose  . 0.9 %  sodium chloride infusion  500 mL Intravenous Continuous Rachael FeeJacobs, Daniel P, MD      . 0.9 %  sodium chloride infusion  500 mL Intravenous Continuous Rachael FeeJacobs, Daniel P, MD        No Known Allergies    Review of Systems:  Constitutional:  No  fever, no chills  Respiratory:  No  shortness of breath.   Skin: No  Rash   Exam:  BP (!) 142/86   Pulse 68   Temp 98.5 F (36.9 C)   Wt  178 lb (80.7 kg)   BMI 33.63 kg/m   Constitutional: VS see above. General Appearance: alert, well-developed, well-nourished, NAD  Ears, Nose, Mouth, Throat: MMM, Normal external inspection ears/nares/mouth/lips/gums.   Neck: No masses, trachea midline.   Musculoskeletal: Gait normal.   Neurological: Normal balance/coordination. No tremor.    Skin: warm, dry, intact. No rash/ulcer. No concerning nevi or subq nodules on limited exam.    Psychiatric: Normal judgment/insight. Normal mood and affect. Oriented x3.   BREAST: No rashes/skin changes, normal fibrous breast tissue, no masses or tenderness, normal nipple without discharge, normal axilla. In  area of concern, no palpable mass or cyst, ?normal lobular anatomy    ASSESSMENT/PLAN:   Left breast lump - Plan: US BREAST COMPLETE UNI LEFT INC AXILLA, MM Digital Diagnostic Unilat L    Patient Instructions  Plan:  This is unlikely to be anything serious, but of course we will get the appropriate imaging to confirm nothing scary is going on  Plan for diagnostic mammogram and ultrasound of left breast at [facility] [place]  If you don't get a call to set up an appointment in the next day or two, let us know!     Visit summary with medication list and pertinent instructions was printed for patient to review. All questions at time of visit were answered - patient instructed to contact office with any additional concerns. ER/RTC precautions were reviewed with the patient. Follow-up plan: Return for recheck depending on results .    Please note: voice recognition software was used to produce this document, and typos may escape review. Please contact Dr. Lyn HollingsheadAlexander for any needed clarifications.

## 2017-08-30 NOTE — Patient Instructions (Signed)
Plan:  This is unlikely to be anything serious, but of course we will get the appropriate imaging to confirm nothing scary is going on  Plan for diagnostic mammogram and ultrasound of left breast at [facility] [place]  If you don't get a call to set up an appointment in the next day or two, let us know!

## 2017-09-02 ENCOUNTER — Encounter: Payer: Self-pay | Admitting: Osteopathic Medicine

## 2017-09-12 ENCOUNTER — Ambulatory Visit
Admission: RE | Admit: 2017-09-12 | Discharge: 2017-09-12 | Disposition: A | Discharge status: Home / Self Care | Payer: 59 | Source: Ambulatory Visit | Attending: Osteopathic Medicine | Admitting: Osteopathic Medicine

## 2017-09-12 DIAGNOSIS — N6489 Other specified disorders of breast: Secondary | ICD-10-CM | POA: Diagnosis not present

## 2017-09-12 DIAGNOSIS — R928 Other abnormal and inconclusive findings on diagnostic imaging of breast: Secondary | ICD-10-CM | POA: Diagnosis not present

## 2017-09-29 ENCOUNTER — Other Ambulatory Visit: Payer: Self-pay | Admitting: Osteopathic Medicine

## 2017-09-29 MED FILL — CITALOPRAM HBR 20 MG TABLET: 20 | 90 days supply | Qty: 90 | Fill #0

## 2017-10-13 ENCOUNTER — Ambulatory Visit (INDEPENDENT_AMBULATORY_CARE_PROVIDER_SITE_OTHER): Payer: 59 | Admitting: Family Medicine

## 2017-10-13 ENCOUNTER — Other Ambulatory Visit: Payer: Self-pay | Admitting: Family Medicine

## 2017-10-13 VITALS — BP 114/77 | HR 82 | Ht 61.0 in | Wt 180.0 lb

## 2017-10-13 DIAGNOSIS — R509 Fever, unspecified: Secondary | ICD-10-CM

## 2017-10-13 DIAGNOSIS — F4321 Adjustment disorder with depressed mood: Secondary | ICD-10-CM

## 2017-10-13 NOTE — Patient Instructions (Signed)
Thank you for coming in today. Get labs today.  We will also do a urine culture.  We will continue to observe.  If labs come back abnormal we will continue to check into with more detail.  Let us know if your symptoms of grief worsen.

## 2017-10-13 NOTE — Progress Notes (Signed)
Kristine Stevens is a 52 y.o. female who presents to [facility] today for subjective fever symptoms.  Kristine Stevens presents to clinic today with a 2-week history of body aches and subjective fevers and chills.  She notes that when she measures her temperature is typically at or less than 100 F.  Notes her symptoms typically worsen in the afternoon and improve during the day.  They are easily controlled with ibuprofen.  She is concerned because she has a sick grandchild and is worried about getting her worse or exposing her to illness.  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  She denies any vomiting diarrhea cough congestion runny nose or urinary symptoms.  She notes that she was treated for urinary tract infection about a month ago but did not have a test of cure.  She denies any new exposures foreign travel cosmetics or medications.  She also notes recent family tragedy.  Her 71-month-old granddaughter had a so far cryptogenic massive stroke a few weeks ago and is currently in a pediatric neuro hospital receiving therapeutic intervention. Kristine Stevens (the granddaughter) is displaying symptoms of hemiparesis but overall otherwise doing well. Kristine Stevens has a personal history of 2 of her adolescent children dying suddenly.  Her 18 year old and 53 year old children were involved in a boating accident and died due to trauma.  This was years ago but her grandchild being sick in the hospital is obviously bringing back memories and symptoms.  Despite her obvious family trauma Kristine Stevens seems to be doing pretty well and notes that her symptoms although are present are not bad enough to require further treatment.  She receives counseling services through her pastor.   Past Medical History:  Diagnosis Date  . GERD (gastroesophageal  reflux disease)   . Heartburn   . Hyperlipidemia   . Hypertension   . IBS (irritable bowel syndrome)   . PONV (postoperative nausea and vomiting)    severe  . Prediabetes   . Sphincter of Oddi dysfunction    Past Surgical History:  Procedure Laterality Date  . ABDOMINOPLASTY  05/25/2012  . CESAREAN SECTION  1991, 1998   x2  . CHOLECYSTECTOMY  1995  . IRRIGATION AND DEBRIDEMENT ABSCESS Right 05/22/2013   Procedure: MINOR INCISION AND DRAINAGE OF ABSCESS;  Surgeon: Kristine Reaper, MD;  Location: [facility];  Service: Orthopedics;  Laterality: Right;  . KNEE ARTHROSCOPY Left    x3  . LAPAROSCOPIC GASTRIC SLEEVE RESECTION N/A 03/25/2015   Procedure: LAPAROSCOPIC GASTRIC SLEEVE RESECTION UPPER ENDOSCOPY;  Surgeon: Luretha Murphy, MD;  Location: [facility];  Service: General;  Laterality: N/A;  . VAGINAL BIRTH AFTER CESAREAN SECTION  1993, 2001   x2   Social History   Tobacco Use  . Smoking status: Never Smoker  . Smokeless tobacco: Never Used  Substance Use Topics  . Alcohol use: Yes    Comment: rare   family history includes Breast cancer (age of onset: 83) in her cousin; Breast cancer (age of onset: 26) in her maternal grandmother; Fibromyalgia in her mother; Hyperlipidemia in her mother; Hypertension in her father and mother.  ROS as above:  Medications: Current Outpatient Medications  Medication Sig Dispense Refill  . aspirin 81 MG tablet Take 81 mg by mouth daily.    . citalopram (CELEXA) 20 MG tablet TAKE 1 TABLET (20 MG TOTAL) BY MOUTH  DAILY. 90 tablet 1  . Multiple Vitamins-Minerals (MULTIVITAMIN ADULT PO) Take by mouth daily.    . Omega-3 Krill Oil 500 MG CAPS Take 1,200 mg by mouth daily.     No current facility-administered medications for this visit.    No Known Allergies  Health Maintenance Health Maintenance  Topic Date Due  . HIV Screening  11/22/1981  . TETANUS/TDAP  11/22/1985  . PAP SMEAR  11/23/1987  . INFLUENZA VACCINE  04/27/2017  .  MAMMOGRAM  03/24/2019  . COLONOSCOPY  05/26/2027     Exam:  BP 114/77   Pulse 82   Ht 5\' 1"  (1.549 m)   Wt 180 lb (81.6 kg)   BMI 34.01 kg/m  Gen: Well NAD HEENT: EOMI,  MMM no significant nasal discharge.  Normal posterior pharynx and tympanic membranes Lungs: Normal work of breathing. CTABL Heart: RRR no MRG Abd: NABS, Soft. Nondistended, Nontender Exts: Brisk capillary refill, warm and well perfused.    No results found for this or any previous visit (from the past 72 hour(s)). No results found.    Assessment and Plan: 52 y.o. female with  Subjective fevers of unknown origin: Patient has symptoms including fevers and body aches that we do not have a clear explanation for.  I think it is reasonable to proceed with a limited workup listed below.  If all is negative I think it is unlikely that she is infectious and can safely see her sick grandchild.  We will continue to follow and broaden workup as needed.  Grief: We had a discussion about management of grief. Kristine FiscalLori is doing extremely well and satisfied with her current services.  I offered counseling or change of medications as needed.  She will let us now.   Orders Placed This Encounter  Procedures  . Urine Culture  . CBC with Differential/Platelet  . COMPLETE METABOLIC PANEL WITH GFR  . Sedimentation rate  . TSH  . CK  . Rheumatoid factor  . ANA   No orders of the defined types were placed in this encounter.    Discussed warning signs or symptoms. Please see discharge instructions. Patient expresses understanding.  I spent 25 minutes with this patient, greater than 50% was face-to-face time counseling regarding ddx and treatment plan.

## 2017-10-14 ENCOUNTER — Encounter: Payer: Self-pay | Admitting: Family Medicine

## 2017-10-14 LAB — CBC WITH DIFFERENTIAL/PLATELET
BASOS ABS: 58 {cells}/uL (ref 0–200)
Basophils Relative: 0.8 %
EOS ABS: 102 {cells}/uL (ref 15–500)
Eosinophils Relative: 1.4 %
HCT: 39.6 % (ref 35.0–45.0)
Hemoglobin: 13.5 g/dL (ref 11.7–15.5)
Lymphs Abs: 4125 cells/uL — ABNORMAL HIGH (ref 850–3900)
MCH: 29.6 pg (ref 27.0–33.0)
MCHC: 34.1 g/dL (ref 32.0–36.0)
MCV: 86.8 fL (ref 80.0–100.0)
MONOS PCT: 6.6 %
MPV: 9.5 fL (ref 7.5–12.5)
Neutro Abs: 2533 cells/uL (ref 1500–7800)
Neutrophils Relative %: 34.7 %
PLATELETS: 269 10*3/uL (ref 140–400)
RBC: 4.56 10*6/uL (ref 3.80–5.10)
RDW: 13.3 % (ref 11.0–15.0)
TOTAL LYMPHOCYTE: 56.5 %
WBC mixed population: 482 cells/uL (ref 200–950)
WBC: 7.3 10*3/uL (ref 3.8–10.8)

## 2017-10-14 LAB — COMPLETE METABOLIC PANEL WITH GFR
AG RATIO: 1.4 (calc) (ref 1.0–2.5)
ALKALINE PHOSPHATASE (APISO): 116 U/L (ref 33–130)
ALT: 76 U/L — ABNORMAL HIGH (ref 6–29)
AST: 70 U/L — ABNORMAL HIGH (ref 10–35)
Albumin: 4 g/dL (ref 3.6–5.1)
BUN/Creatinine Ratio: 8 (calc) (ref 6–22)
BUN: 5 mg/dL — ABNORMAL LOW (ref 7–25)
CHLORIDE: 103 mmol/L (ref 98–110)
CO2: 28 mmol/L (ref 20–32)
CREATININE: 0.66 mg/dL (ref 0.50–1.05)
Calcium: 8.6 mg/dL (ref 8.6–10.4)
GFR, Est African American: 119 mL/min/{1.73_m2} (ref >=60)
GFR, Est Non African American: 103 mL/min/{1.73_m2} (ref >=60)
GLOBULIN: 2.8 g/dL (ref 1.9–3.7)
Glucose, Bld: 93 mg/dL (ref 65–99)
POTASSIUM: 3.7 mmol/L (ref 3.5–5.3)
SODIUM: 138 mmol/L (ref 135–146)
Total Bilirubin: 0.4 mg/dL (ref 0.2–1.2)
Total Protein: 6.8 g/dL (ref 6.1–8.1)

## 2017-10-14 LAB — RHEUMATOID FACTOR: Rhuematoid fact SerPl-aCnc: <14 IU/mL (ref <14)

## 2017-10-14 LAB — URINE CULTURE
MICRO NUMBER: 90072716
RESULT: NO GROWTH
SPECIMEN QUALITY:: ADEQUATE

## 2017-10-14 LAB — ANTI-NUCLEAR AB-TITER (ANA TITER)

## 2017-10-14 LAB — SEDIMENTATION RATE: Sed Rate: 19 mm/h (ref 0–20)

## 2017-10-14 LAB — CK: CK TOTAL: 30 U/L (ref 29–143)

## 2017-10-14 LAB — TSH: TSH: 1.17 m[IU]/L

## 2017-10-14 LAB — ANA: Anti Nuclear Antibody(ANA): POSITIVE — AB

## 2017-10-17 ENCOUNTER — Encounter: Payer: Self-pay | Admitting: Family Medicine

## 2017-10-18 ENCOUNTER — Other Ambulatory Visit: Payer: Self-pay | Admitting: Family Medicine

## 2017-10-18 DIAGNOSIS — R509 Fever, unspecified: Secondary | ICD-10-CM

## 2017-10-18 DIAGNOSIS — R768 Other specified abnormal immunological findings in serum: Secondary | ICD-10-CM

## 2017-10-24 ENCOUNTER — Encounter (HOSPITAL_COMMUNITY): Payer: Self-pay

## 2017-11-02 ENCOUNTER — Encounter: Payer: Self-pay | Admitting: Osteopathic Medicine

## 2017-11-02 ENCOUNTER — Ambulatory Visit (INDEPENDENT_AMBULATORY_CARE_PROVIDER_SITE_OTHER): Payer: 59

## 2017-11-02 ENCOUNTER — Ambulatory Visit (INDEPENDENT_AMBULATORY_CARE_PROVIDER_SITE_OTHER): Payer: 59 | Admitting: Osteopathic Medicine

## 2017-11-02 VITALS — BP 134/89 | HR 75 | Temp 98.0°F | Wt 177.0 lb

## 2017-11-02 DIAGNOSIS — R1013 Epigastric pain: Secondary | ICD-10-CM | POA: Diagnosis not present

## 2017-11-02 DIAGNOSIS — Z8719 Personal history of other diseases of the digestive system: Secondary | ICD-10-CM | POA: Diagnosis not present

## 2017-11-02 DIAGNOSIS — Z903 Acquired absence of stomach [part of]: Secondary | ICD-10-CM | POA: Diagnosis not present

## 2017-11-02 DIAGNOSIS — Z87898 Personal history of other specified conditions: Secondary | ICD-10-CM | POA: Diagnosis not present

## 2017-11-02 DIAGNOSIS — R7401 Elevation of levels of liver transaminase levels: Secondary | ICD-10-CM | POA: Insufficient documentation

## 2017-11-02 DIAGNOSIS — I10 Essential (primary) hypertension: Secondary | ICD-10-CM

## 2017-11-02 DIAGNOSIS — R740 Nonspecific elevation of levels of transaminase and lactic acid dehydrogenase [LDH]: Secondary | ICD-10-CM

## 2017-11-02 MED ORDER — DICYCLOMINE HCL 20 MG PO TABS: 20.0000 mg | ORAL_TABLET | Freq: Three times a day (TID) | ORAL | 2 refills | Status: DC

## 2017-11-02 MED FILL — DICYCLOMINE 20 MG TABLET: 20 | 15 days supply | Qty: 60 | Fill #0

## 2017-11-02 NOTE — Progress Notes (Signed)
HPI: Kristine Stevens is a 52 y.o. female who  has a past medical history of GERD (gastroesophageal reflux disease), Heartburn, Hyperlipidemia, Hypertension, IBS (irritable bowel syndrome), PONV (postoperative nausea and vomiting), Prediabetes, and Sphincter of Oddi dysfunction.  she presents to [facility] today, 11/02/17,  for chief complaint of: Abdominal Pain, Back Pain  Upper/epigastric pain. 5 weeks or so, on and off. Cramping/tightness, no obvious triger. Tylenol has helped. Hx gastric sleeve, hiatal hernia, cholecystectomy. Chronic watery stool. Hx pancreatitis but this does not feel similar. No N/V or GERD     Past medical, surgical, social and family history reviewed:  Patient Active Problem List   Diagnosis Date Noted  . Right foot pain 01/27/2017  . History of high cholesterol 05/27/2016  . History of gestational diabetes 05/27/2016  . History of gestational hypertension 05/27/2016  . History of acute pancreatitis 05/27/2016  . Acid reflux 04/17/2015  . Prediabetes 04/17/2015  . 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 Hyperlipemia 01/02/2015  . Dysfunction of sphincter of Oddi 08/16/2013    Past Surgical History:  Procedure Laterality Date  . ABDOMINOPLASTY  05/25/2012  . CESAREAN SECTION  1991, 1998   x2  . CHOLECYSTECTOMY  1995  . IRRIGATION AND DEBRIDEMENT ABSCESS Right 05/22/2013   Procedure: MINOR INCISION AND DRAINAGE OF ABSCESS;  Surgeon: Nicki ReaperGary R Kuzma, MD;  Location: [facility];  Service: Orthopedics;  Laterality: Right;  . KNEE ARTHROSCOPY Left    x3  . LAPAROSCOPIC GASTRIC SLEEVE RESECTION N/A 03/25/2015   Procedure: LAPAROSCOPIC GASTRIC SLEEVE RESECTION UPPER ENDOSCOPY;  Surgeon: Luretha MurphyMatthew Martin, MD;  Location: [facility];  Service: General;  Laterality: N/A;  . VAGINAL BIRTH AFTER CESAREAN SECTION  1993, 2001   x2    Social History   Tobacco Use  . Smoking status: Never Smoker  .  Smokeless tobacco: Never Used  Substance Use Topics  . Alcohol use: Yes    Comment: rare    Family History  Problem Relation Age of Onset  . Hypertension Mother   . Hyperlipidemia Mother   . Fibromyalgia Mother   . Hypertension Father   . Breast cancer Maternal Grandmother 670  . Breast cancer Cousin 45  . Colon cancer Neg Hx   . Esophageal cancer Neg Hx   . Rectal cancer Neg Hx   . Stomach cancer Neg Hx      Current medication list and allergy/intolerance information reviewed:    Current Outpatient Medications  Medication Sig Dispense Refill  . aspirin 81 MG tablet Take 81 mg by mouth daily.    . citalopram (CELEXA) 20 MG tablet TAKE 1 TABLET (20 MG TOTAL) BY MOUTH DAILY. 90 tablet 1  . Multiple Vitamins-Minerals (MULTIVITAMIN ADULT PO) Take by mouth daily.    . Omega-3 Krill Oil 500 MG CAPS Take 1,200 mg by mouth daily.     No current facility-administered medications for this visit.     No Known Allergies    Review of Systems:  Constitutional:  +intermittent fever (awaiting rheum w/u), no chills, No unintentional weight changes. +significant fatigue.   HEENT: No  headache, no vision change  Cardiac: No  chest pain, No  pressure, No palpitations  Respiratory:  No  shortness of breath. No  Cough  Gastrointestinal: +abdominal pain, No  nausea, No  vomiting,  No  blood in stool, No  diarrhea, No  constipation   Musculoskeletal: No new myalgia/arthralgia  Skin: No  Rash  Neurologic:  No  weakness, No  dizziness   Exam:  BP 134/89   Pulse 75   Temp 98 F (36.7 C) (Oral)   Wt 177 lb 0.6 oz (80.3 kg)   BMI 33.45 kg/m   Constitutional: VS see above. General Appearance: alert, well-developed, well-nourished, NAD  Eyes: Normal lids and conjunctive, non-icteric sclera  Ears, Nose, Mouth, Throat: MMM, Normal external inspection ears/nares/mouth/lips/gums.  Neck: No masses, trachea midline.   Respiratory: Normal respiratory effort. no wheeze, no rhonchi, no  rales  Cardiovascular: S1/S2 normal, no murmur, no rub/gallop auscultated. RRR. No lower extremity edema.   Gastrointestinal: (+)TTP epigastric, RUQ, no masses. No hepatomegaly, no splenomegaly. No hernia appreciated. Bowel sounds normal. Rectal exam deferred.   Musculoskeletal: Gait normal. No clubbing/cyanosis of digits.   Neurological: Normal balance/coordination. No tremor.  Skin: warm, dry, intact.   Psychiatric: Normal judgment/insight. Normal mood and affect. Oriented x3.     ASSESSMENT/PLAN: Sounds like possible peristalsis against scar tissue, recent elevated transaminases concerning. Will trial refill Bentyl for symptom relief while we await more information, low threshold to refer to GI or Gen Surg.   Epigastric pain - Plan: CBC with Differential/Platelet, COMPLETE METABOLIC PANEL WITH GFR, Lipase, Amylase, US Abdomen Complete  History of acute pancreatitis  History of impaired glucose tolerance - Plan: Hemoglobin A1c  History of sleeve gastrectomy - Plan: Parathyroid hormone, intact (no Ca), VITAMIN D 25 Hydroxy (Vit-D Deficiency, Fractures), Vitamin B1, Vitamin B12, Zinc, Folate, Fe+TIBC+Fer     Visit summary with medication list and pertinent instructions was printed for patient to review. All questions at time of visit were answered - patient instructed to contact office with any additional concerns. ER/RTC precautions were reviewed with the patient.   Follow-up plan: Return if symptoms worsen or fail to improve.  Note: Total time spent 25 minutes, greater than 50% of the visit was spent face-to-face counseling and coordinating care for the following: The primary encounter diagnosis was Epigastric pain. Diagnoses of History of acute pancreatitis, History of impaired glucose tolerance, and History of sleeve gastrectomy were also pertinent to this visit.Marland Kitchen  Please note: voice recognition software was used to produce this document, and typos may escape review. Please  contact Dr. Lyn Hollingshead for any needed clarifications.

## 2017-11-03 LAB — CBC WITH DIFFERENTIAL/PLATELET
Basophils Absolute: 31 cells/uL (ref 0–200)
Basophils Relative: 0.5 %
EOS PCT: 2 %
Eosinophils Absolute: 122 cells/uL (ref 15–500)
HCT: 36.6 % (ref 35.0–45.0)
Hemoglobin: 13 g/dL (ref 11.7–15.5)
Lymphs Abs: 3264 cells/uL (ref 850–3900)
MCH: 30.4 pg (ref 27.0–33.0)
MCHC: 35.5 g/dL (ref 32.0–36.0)
MCV: 85.7 fL (ref 80.0–100.0)
MPV: 9.1 fL (ref 7.5–12.5)
Monocytes Relative: 8 %
NEUTROS PCT: 36 %
Neutro Abs: 2196 cells/uL (ref 1500–7800)
PLATELETS: 293 10*3/uL (ref 140–400)
RBC: 4.27 10*6/uL (ref 3.80–5.10)
RDW: 13.5 % (ref 11.0–15.0)
TOTAL LYMPHOCYTE: 53.5 %
WBC mixed population: 488 cells/uL (ref 200–950)
WBC: 6.1 10*3/uL (ref 3.8–10.8)

## 2017-11-03 LAB — HEMOGLOBIN A1C
HEMOGLOBIN A1C: 5.7 %{Hb} — AB (ref <5.7)
MEAN PLASMA GLUCOSE: 117 (calc)
eAG (mmol/L): 6.5 (calc)

## 2017-11-03 LAB — COMPLETE METABOLIC PANEL WITH GFR
AG RATIO: 1.3 (calc) (ref 1.0–2.5)
ALKALINE PHOSPHATASE (APISO): 83 U/L (ref 33–130)
ALT: 53 U/L — AB (ref 6–29)
AST: 39 U/L — AB (ref 10–35)
Albumin: 3.8 g/dL (ref 3.6–5.1)
BUN/Creatinine Ratio: 10 (calc) (ref 6–22)
BUN: 6 mg/dL — ABNORMAL LOW (ref 7–25)
CALCIUM: 8.7 mg/dL (ref 8.6–10.4)
CO2: 27 mmol/L (ref 20–32)
Chloride: 105 mmol/L (ref 98–110)
Creat: 0.61 mg/dL (ref 0.50–1.05)
GFR, EST NON AFRICAN AMERICAN: 106 mL/min/{1.73_m2} (ref >=60)
GFR, Est African American: 123 mL/min/{1.73_m2} (ref >=60)
Globulin: 3 g/dL (calc) (ref 1.9–3.7)
Glucose, Bld: 86 mg/dL (ref 65–99)
POTASSIUM: 3.8 mmol/L (ref 3.5–5.3)
Sodium: 139 mmol/L (ref 135–146)
Total Bilirubin: 0.4 mg/dL (ref 0.2–1.2)
Total Protein: 6.8 g/dL (ref 6.1–8.1)

## 2017-11-03 LAB — LIPASE: Lipase: 36 U/L (ref 7–60)

## 2017-11-03 LAB — AMYLASE: AMYLASE: 32 U/L (ref 21–101)

## 2017-11-06 LAB — VITAMIN B1: Vitamin B1 (Thiamine): 6 nmol/L — ABNORMAL LOW (ref 8–30)

## 2017-11-06 LAB — IRON,TIBC AND FERRITIN PANEL
%SAT: 36 % (ref 11–50)
Ferritin: 44 ng/mL (ref 10–232)
Iron: 118 ug/dL (ref 45–160)
TIBC: 324 ug/dL (ref 250–450)

## 2017-11-06 LAB — PARATHYROID HORMONE, INTACT (NO CA): PTH: 81 pg/mL — AB (ref 14–64)

## 2017-11-06 LAB — FOLATE: Folate: 8.8 ng/mL

## 2017-11-06 LAB — VITAMIN D 25 HYDROXY (VIT D DEFICIENCY, FRACTURES): Vit D, 25-Hydroxy: 23 ng/mL — ABNORMAL LOW (ref 30–100)

## 2017-11-06 LAB — ZINC: ZINC: 78 ug/dL (ref 60–130)

## 2017-11-14 ENCOUNTER — Ambulatory Visit (INDEPENDENT_AMBULATORY_CARE_PROVIDER_SITE_OTHER): Payer: 59 | Admitting: Family Medicine

## 2017-11-14 ENCOUNTER — Encounter: Payer: Self-pay | Admitting: Family Medicine

## 2017-11-14 VITALS — BP 132/80 | HR 96 | Temp 98.7°F | Ht 61.0 in | Wt 177.0 lb

## 2017-11-14 DIAGNOSIS — R05 Cough: Secondary | ICD-10-CM | POA: Diagnosis not present

## 2017-11-14 DIAGNOSIS — J101 Influenza due to other identified influenza virus with other respiratory manifestations: Secondary | ICD-10-CM | POA: Diagnosis not present

## 2017-11-14 DIAGNOSIS — R519 Headache, unspecified: Secondary | ICD-10-CM

## 2017-11-14 DIAGNOSIS — R51 Headache: Secondary | ICD-10-CM | POA: Diagnosis not present

## 2017-11-14 DIAGNOSIS — R509 Fever, unspecified: Secondary | ICD-10-CM | POA: Diagnosis not present

## 2017-11-14 DIAGNOSIS — R059 Cough, unspecified: Secondary | ICD-10-CM

## 2017-11-14 LAB — POCT INFLUENZA A/B
Influenza A, POC: NEGATIVE
Influenza B, POC: POSITIVE — AB

## 2017-11-14 MED ORDER — OSELTAMIVIR PHOSPHATE 75 MG PO CAPS: 75.0000 mg | ORAL_CAPSULE | Freq: Two times a day (BID) | ORAL | 0 refills | Status: DC

## 2017-11-14 NOTE — Patient Instructions (Addendum)

## 2017-11-14 NOTE — Progress Notes (Signed)
   Subjective:    Patient ID: Kristine Stevens, female    DOB: 12-22-1965, 52 y.o.   MRN: [id number]  HPI 52 yo female  - runny nose, left sided sinus pressure, cough, fever 100.4, bodyaches. Started Saturday.  She has tried zyrtec and Nyquail with little improvement.  Felt worse last night.  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  She just does not want to be around the children if she is positive for the flu.     Review of Systems     Objective:   Physical Exam  Constitutional: She is oriented to person, place, and time. She appears well-developed and well-nourished.  HENT:  Head: Normocephalic and atraumatic.  Right Ear: External ear normal.  Left Ear: External ear normal.  Nose: Nose normal.  Mouth/Throat: Oropharynx is clear and moist.  TMs and canals are clear.   Eyes: Conjunctivae and EOM are normal. Pupils are equal, round, and reactive to light.  Neck: Neck supple. No thyromegaly present.  Cardiovascular: Normal rate, regular rhythm and normal heart sounds.  Pulmonary/Chest: Effort normal and breath sounds normal. She has no wheezes.  Lymphadenopathy:    She has no cervical adenopathy.  Neurological: She is alert and oriented to person, place, and time.  Skin: Skin is warm and dry.  Psychiatric: She has a normal mood and affect.       Assessment & Plan:  Influenza B-discussed option of Tamiflu as she is within the window take medication.  She is not sure she wants to take it to sent prescription to pharmacy.  She does need to be out of work for at least the next 24-48 hours or until she is afebrile for 48 hours.  If she needs a work note she will give us a call back.  Symptomatic care.

## 2017-11-30 DIAGNOSIS — R945 Abnormal results of liver function studies: Secondary | ICD-10-CM | POA: Diagnosis not present

## 2017-11-30 DIAGNOSIS — R768 Other specified abnormal immunological findings in serum: Secondary | ICD-10-CM | POA: Diagnosis not present

## 2017-11-30 DIAGNOSIS — Z6834 Body mass index (BMI) 34.0-34.9, adult: Secondary | ICD-10-CM | POA: Diagnosis not present

## 2017-11-30 DIAGNOSIS — R509 Fever, unspecified: Secondary | ICD-10-CM | POA: Diagnosis not present

## 2017-11-30 DIAGNOSIS — E669 Obesity, unspecified: Secondary | ICD-10-CM | POA: Diagnosis not present

## 2018-02-03 DIAGNOSIS — R109 Unspecified abdominal pain: Secondary | ICD-10-CM | POA: Diagnosis not present

## 2018-02-06 ENCOUNTER — Other Ambulatory Visit: Payer: Self-pay | Admitting: Surgery

## 2018-02-06 DIAGNOSIS — R109 Unspecified abdominal pain: Secondary | ICD-10-CM

## 2018-03-01 MED FILL — CITALOPRAM HBR 20 MG TABLET: 20 | 90 days supply | Qty: 90 | Fill #1

## 2018-05-04 ENCOUNTER — Other Ambulatory Visit: Payer: Self-pay | Admitting: Osteopathic Medicine

## 2018-05-04 DIAGNOSIS — Z1231 Encounter for screening mammogram for malignant neoplasm of breast: Secondary | ICD-10-CM

## 2018-05-05 ENCOUNTER — Ambulatory Visit (INDEPENDENT_AMBULATORY_CARE_PROVIDER_SITE_OTHER): Payer: 59

## 2018-05-05 DIAGNOSIS — Z1231 Encounter for screening mammogram for malignant neoplasm of breast: Secondary | ICD-10-CM

## 2018-05-23 DIAGNOSIS — H524 Presbyopia: Secondary | ICD-10-CM | POA: Diagnosis not present

## 2018-05-23 MED FILL — LOTEPREDNOL ETABONATE 0.5 %: 0.5 | 30 days supply | Qty: 5 | Fill #0

## 2018-06-05 ENCOUNTER — Other Ambulatory Visit: Payer: Self-pay | Admitting: Osteopathic Medicine

## 2018-06-22 MED FILL — CITALOPRAM HBR 20 MG TABLET: 20 | 90 days supply | Qty: 90 | Fill #0

## 2018-08-29 ENCOUNTER — Ambulatory Visit (INDEPENDENT_AMBULATORY_CARE_PROVIDER_SITE_OTHER): Payer: 59

## 2018-08-29 ENCOUNTER — Ambulatory Visit (INDEPENDENT_AMBULATORY_CARE_PROVIDER_SITE_OTHER): Payer: 59 | Admitting: Osteopathic Medicine

## 2018-08-29 ENCOUNTER — Encounter: Payer: Self-pay | Admitting: Osteopathic Medicine

## 2018-08-29 VITALS — BP 134/79 | HR 70 | Temp 97.8°F | Wt 181.9 lb

## 2018-08-29 DIAGNOSIS — R102 Pelvic and perineal pain: Secondary | ICD-10-CM | POA: Diagnosis not present

## 2018-08-29 DIAGNOSIS — R1900 Intra-abdominal and pelvic swelling, mass and lump, unspecified site: Secondary | ICD-10-CM

## 2018-08-29 DIAGNOSIS — R7303 Prediabetes: Secondary | ICD-10-CM | POA: Diagnosis not present

## 2018-08-29 DIAGNOSIS — M545 Low back pain, unspecified: Secondary | ICD-10-CM

## 2018-08-29 DIAGNOSIS — R232 Flushing: Secondary | ICD-10-CM | POA: Diagnosis not present

## 2018-08-29 DIAGNOSIS — G8929 Other chronic pain: Secondary | ICD-10-CM

## 2018-08-29 DIAGNOSIS — Z87898 Personal history of other specified conditions: Secondary | ICD-10-CM | POA: Diagnosis not present

## 2018-08-29 DIAGNOSIS — R5382 Chronic fatigue, unspecified: Secondary | ICD-10-CM

## 2018-08-29 DIAGNOSIS — M533 Sacrococcygeal disorders, not elsewhere classified: Secondary | ICD-10-CM | POA: Diagnosis not present

## 2018-08-29 DIAGNOSIS — R1907 Generalized intra-abdominal and pelvic swelling, mass and lump: Secondary | ICD-10-CM | POA: Diagnosis not present

## 2018-08-29 NOTE — Patient Instructions (Signed)
   Will get Xrays of lower back and ultrasound of uterus/ovaries   Will get labs to evaluate for fatigue and recheck ANA   Consider follow-up with sports medicine to evaluate back pain / discuss injections if needed

## 2018-08-29 NOTE — Progress Notes (Signed)
HPI: Kristine Stevens is a 52 y.o. female who  has a past medical history of GERD (gastroesophageal reflux disease), Heartburn, Hyperlipidemia, Hypertension, IBS (irritable bowel syndrome), PONV (postoperative nausea and vomiting), Prediabetes, and Sphincter of Oddi dysfunction.  she presents to [facility] today, 08/29/18,  for chief complaint of:  Back pain   . Location: lower back pain . Quality: bloating, soreness, pressure . Severity/Timing: described as constant 3-4/10  . Duration: since 09/2017 (better part of a year)  . Assoc signs/symptoms: no urinary problems, +fatigue, lower L pelvic fullness, she's worried about r/o cancer     At today's visit... Past medical history, surgical history, and family history reviewed and updated as needed.  Current medication list and allergy/intolerance information reviewed and updated as needed. (See remainder of HPI, ROS, Phys Exam below)           ASSESSMENT/PLAN: The primary encounter diagnosis was Chronic left-sided low back pain without sciatica. Diagnoses of Pelvic fullness in female, Chronic fatigue, Hot flashes, Prediabetes, and History of elevated antinuclear antibody (ANA) were also pertinent to this visit.   Suspect arthritis most likely, will get imaging to further evaluate.   Orders Placed This Encounter  Procedures  . DG Lumbar Spine 2-3 Views  . DG Si Joints  . US PELVIS TRANSVANGINAL NON-OB (TV ONLY)  . US Pelvis Complete  . CBC  . COMPLETE METABOLIC PANEL WITH GFR  . Lipid panel  . TSH  . VITAMIN D 25 Hydroxy (Vit-D Deficiency, Fractures)  . Hemoglobin A1c  . ANA  . Urinalysis, Routine w reflex microscopic      Patient Instructions   Will get Xrays of lower back and ultrasound of uterus/ovaries   Will get labs to evaluate for fatigue and recheck ANA   Consider follow-up with sports medicine to evaluate back pain / discuss injections if needed      Follow-up  plan: Return for recheck depending on results .                             ############################################ ############################################ ############################################ ############################################    Current Meds  Medication Sig  . aspirin 81 MG tablet Take 81 mg by mouth daily.  . citalopram (CELEXA) 20 MG tablet TAKE 1 TABLET (20 MG TOTAL) BY MOUTH DAILY.  Marland Kitchen. dicyclomine (BENTYL) 20 MG tablet Take 1 tablet (20 mg total) by mouth 4 (four) times daily -  before meals and at bedtime.  . Multiple Vitamins-Minerals (MULTIVITAMIN ADULT PO) Take by mouth daily.  . Omega-3 Krill Oil 500 MG CAPS Take 1,200 mg by mouth daily.  . [DISCONTINUED] oseltamivir (TAMIFLU) 75 MG capsule Take 1 capsule (75 mg total) by mouth 2 (two) times daily. X 10 days    No Known Allergies     Review of Systems:  Constitutional: No recent illness, +fatigue ongoing worsening for a few months   HEENT: No  headache, no vision change  Cardiac: No  chest pain, No  pressure, No palpitations  Respiratory:  No  shortness of breath. No  Cough  Gastrointestinal: No  abdominal pain, no change on bowel habits  Musculoskeletal: No new myalgia/arthralgia, chronic LBP as per HPI  Skin: No  Rash  Hem/Onc: No  easy bruising/bleeding  Neurologic: No  weakness, No  Dizziness  Psychiatric: No  concerns with depression, No  concerns with anxiety  Exam:  BP 134/79   Pulse 70   Temp  97.8 F (36.6 C) (Oral)   Wt 181 lb 14.4 oz (82.5 kg)   BMI 34.37 kg/m   Constitutional: VS see above. General Appearance: alert, well-developed, well-nourished, NAD  Eyes: Normal lids and conjunctive, non-icteric sclera  Ears, Nose, Mouth, Throat: MMM, Normal external inspection ears/nares/mouth/lips/gums.  Neck: No masses, trachea midline.   Respiratory: Normal respiratory effort. no wheeze, no rhonchi, no rales  Cardiovascular: S1/S2  normal, no murmur, no rub/gallop auscultated. RRR.   Musculoskeletal: Gait normal. Symmetric and independent movement of all extremities, neg hip exam (log roll, int/ext rotation), neg SLR on L   Abdominal: non-tender except lower LLQ/pelvic area, non-distended, no appreciable organomegaly, neg Murphy's, BS [id number]  Neurological: Normal balance/coordination. No tremor.  Skin: warm, dry, intact.   Psychiatric: Normal judgment/insight. Normal mood and affect. Oriented x3.       Visit summary with medication list and pertinent instructions was printed for patient to review, patient was advised to alert [place] if any updates are needed. All questions at time of visit were answered - patient instructed to contact office with any additional concerns. ER/RTC precautions were reviewed with the patient and understanding verbalized.     Please note: voice recognition software was used to produce this document, and typos may escape review. Please contact Dr. Lyn Hollingshead for any needed clarifications.    Follow up plan: Return for recheck depending on results .

## 2018-08-30 ENCOUNTER — Encounter: Payer: Self-pay | Admitting: Osteopathic Medicine

## 2018-08-30 LAB — URINALYSIS, ROUTINE W REFLEX MICROSCOPIC
Bacteria, UA: NONE SEEN /HPF
Bilirubin Urine: NEGATIVE
Glucose, UA: NEGATIVE
Hgb urine dipstick: NEGATIVE
Hyaline Cast: NONE SEEN /LPF
KETONES UR: NEGATIVE
Nitrite: NEGATIVE
Protein, ur: NEGATIVE
RBC / HPF: NONE SEEN /HPF (ref 0–2)
SPECIFIC GRAVITY, URINE: 1.017 (ref 1.001–1.03)
pH: 6 (ref 5.0–8.0)

## 2018-08-31 LAB — COMPLETE METABOLIC PANEL WITH GFR
AG RATIO: 1.7 (calc) (ref 1.0–2.5)
ALBUMIN MSPROF: 4.3 g/dL (ref 3.6–5.1)
ALT: 21 U/L (ref 6–29)
AST: 22 U/L (ref 10–35)
Alkaline phosphatase (APISO): 71 U/L (ref 33–130)
BUN: 9 mg/dL (ref 7–25)
CALCIUM: 9.3 mg/dL (ref 8.6–10.4)
CHLORIDE: 105 mmol/L (ref 98–110)
CO2: 28 mmol/L (ref 20–32)
Creat: 0.8 mg/dL (ref 0.50–1.05)
GFR, Est African American: 99 mL/min/{1.73_m2} (ref >=60)
GFR, Est Non African American: 85 mL/min/{1.73_m2} (ref >=60)
GLOBULIN: 2.6 g/dL (ref 1.9–3.7)
Glucose, Bld: 98 mg/dL (ref 65–99)
Potassium: 4.2 mmol/L (ref 3.5–5.3)
Sodium: 140 mmol/L (ref 135–146)
TOTAL PROTEIN: 6.9 g/dL (ref 6.1–8.1)
Total Bilirubin: 0.4 mg/dL (ref 0.2–1.2)

## 2018-08-31 LAB — CBC
HEMATOCRIT: 39.8 % (ref 35.0–45.0)
HEMOGLOBIN: 13.8 g/dL (ref 11.7–15.5)
MCH: 30.3 pg (ref 27.0–33.0)
MCHC: 34.7 g/dL (ref 32.0–36.0)
MCV: 87.5 fL (ref 80.0–100.0)
MPV: 9.7 fL (ref 7.5–12.5)
Platelets: 292 10*3/uL (ref 140–400)
RBC: 4.55 10*6/uL (ref 3.80–5.10)
RDW: 12.6 % (ref 11.0–15.0)
WBC: 4.4 10*3/uL (ref 3.8–10.8)

## 2018-08-31 LAB — HEMOGLOBIN A1C
Hgb A1c MFr Bld: 5.8 % of total Hgb — ABNORMAL HIGH (ref <5.7)
MEAN PLASMA GLUCOSE: 120 (calc)
eAG (mmol/L): 6.6 (calc)

## 2018-08-31 LAB — LIPID PANEL
CHOL/HDL RATIO: 4.7 (calc) (ref <5.0)
CHOLESTEROL: 258 mg/dL — AB (ref <200)
HDL: 55 mg/dL (ref >50)
LDL Cholesterol (Calc): 176 mg/dL (calc) — ABNORMAL HIGH
NON-HDL CHOLESTEROL (CALC): 203 mg/dL — AB (ref <130)
Triglycerides: 133 mg/dL (ref <150)

## 2018-08-31 LAB — ANTI-NUCLEAR AB-TITER (ANA TITER)
ANA TITER: 1:80 {titer} — ABNORMAL HIGH
ANA Titer 1: 1:80 {titer} — ABNORMAL HIGH

## 2018-08-31 LAB — ANA: Anti Nuclear Antibody(ANA): POSITIVE — AB

## 2018-08-31 LAB — VITAMIN D 25 HYDROXY (VIT D DEFICIENCY, FRACTURES): Vit D, 25-Hydroxy: 46 ng/mL (ref 30–100)

## 2018-08-31 LAB — TSH: TSH: 1.31 mIU/L

## 2018-09-01 ENCOUNTER — Encounter: Payer: Self-pay | Admitting: Osteopathic Medicine

## 2018-09-01 MED ORDER — MELOXICAM 7.5 MG PO TABS: 7.5000 mg | ORAL_TABLET | Freq: Every day | ORAL | 0 refills | Status: DC

## 2018-09-14 DIAGNOSIS — Z6835 Body mass index (BMI) 35.0-35.9, adult: Secondary | ICD-10-CM | POA: Diagnosis not present

## 2018-09-14 DIAGNOSIS — R945 Abnormal results of liver function studies: Secondary | ICD-10-CM | POA: Diagnosis not present

## 2018-09-14 DIAGNOSIS — R509 Fever, unspecified: Secondary | ICD-10-CM | POA: Diagnosis not present

## 2018-09-14 DIAGNOSIS — E669 Obesity, unspecified: Secondary | ICD-10-CM | POA: Diagnosis not present

## 2018-09-14 DIAGNOSIS — R768 Other specified abnormal immunological findings in serum: Secondary | ICD-10-CM | POA: Diagnosis not present

## 2018-09-14 DIAGNOSIS — M3500 Sicca syndrome, unspecified: Secondary | ICD-10-CM | POA: Diagnosis not present

## 2018-10-03 DIAGNOSIS — H04123 Dry eye syndrome of bilateral lacrimal glands: Secondary | ICD-10-CM | POA: Diagnosis not present

## 2018-10-03 DIAGNOSIS — J343 Hypertrophy of nasal turbinates: Secondary | ICD-10-CM | POA: Diagnosis not present

## 2018-10-03 DIAGNOSIS — R682 Dry mouth, unspecified: Secondary | ICD-10-CM | POA: Diagnosis not present

## 2018-10-09 DIAGNOSIS — R509 Fever, unspecified: Secondary | ICD-10-CM | POA: Diagnosis not present

## 2018-10-09 DIAGNOSIS — R682 Dry mouth, unspecified: Secondary | ICD-10-CM | POA: Diagnosis not present

## 2018-10-10 ENCOUNTER — Encounter: Payer: Self-pay | Admitting: Family Medicine

## 2018-10-11 LAB — COMPLETE METABOLIC PANEL WITH GFR
AG Ratio: 1.5 (calc) (ref 1.0–2.5)
ALBUMIN MSPROF: 3.8 g/dL (ref 3.6–5.1)
ALKALINE PHOSPHATASE (APISO): 61 U/L (ref 33–130)
ALT: 14 U/L (ref 6–29)
AST: 15 U/L (ref 10–35)
BILIRUBIN TOTAL: 0.4 mg/dL (ref 0.2–1.2)
BUN: 12 mg/dL (ref 7–25)
CHLORIDE: 106 mmol/L (ref 98–110)
CO2: 25 mmol/L (ref 20–32)
Calcium: 8.8 mg/dL (ref 8.6–10.4)
Creat: 0.76 mg/dL (ref 0.50–1.05)
GFR, Est African American: 105 mL/min/{1.73_m2} (ref >=60)
GFR, Est Non African American: 91 mL/min/{1.73_m2} (ref >=60)
GLOBULIN: 2.6 g/dL (ref 1.9–3.7)
GLUCOSE: 86 mg/dL (ref 65–99)
Potassium: 4.1 mmol/L (ref 3.5–5.3)
SODIUM: 140 mmol/L (ref 135–146)
Total Protein: 6.4 g/dL (ref 6.1–8.1)

## 2018-10-11 LAB — ANA: ANA: POSITIVE — AB

## 2018-10-11 LAB — CBC WITH DIFFERENTIAL/PLATELET
ABSOLUTE MONOCYTES: 474 {cells}/uL (ref 200–950)
Basophils Absolute: 9 cells/uL (ref 0–200)
Basophils Relative: 0.2 %
EOS ABS: 110 {cells}/uL (ref 15–500)
Eosinophils Relative: 2.4 %
HEMATOCRIT: 38.5 % (ref 35.0–45.0)
HEMOGLOBIN: 13.4 g/dL (ref 11.7–15.5)
LYMPHS ABS: 1909 {cells}/uL (ref 850–3900)
MCH: 31.1 pg (ref 27.0–33.0)
MCHC: 34.8 g/dL (ref 32.0–36.0)
MCV: 89.3 fL (ref 80.0–100.0)
MPV: 9.4 fL (ref 7.5–12.5)
Monocytes Relative: 10.3 %
NEUTROS ABS: 2098 {cells}/uL (ref 1500–7800)
Neutrophils Relative %: 45.6 %
Platelets: 302 10*3/uL (ref 140–400)
RBC: 4.31 10*6/uL (ref 3.80–5.10)
RDW: 12.7 % (ref 11.0–15.0)
Total Lymphocyte: 41.5 %
WBC: 4.6 10*3/uL (ref 3.8–10.8)

## 2018-10-11 LAB — TSH: TSH: 0.99 m[IU]/L

## 2018-10-11 LAB — ANTI-NUCLEAR AB-TITER (ANA TITER)
ANA TITER: 1:80 {titer} — ABNORMAL HIGH
ANA Titer 1: 1:80 {titer} — ABNORMAL HIGH

## 2018-10-11 LAB — RHEUMATOID FACTOR

## 2018-10-11 LAB — SEDIMENTATION RATE: Sed Rate: 6 mm/h (ref 0–30)

## 2018-10-11 LAB — CK: CK TOTAL: 50 U/L (ref 29–143)

## 2018-10-24 DIAGNOSIS — K118 Other diseases of salivary glands: Secondary | ICD-10-CM | POA: Diagnosis not present

## 2018-10-25 DIAGNOSIS — K1123 Chronic sialoadenitis: Secondary | ICD-10-CM | POA: Diagnosis not present

## 2018-10-27 DIAGNOSIS — K090 Developmental odontogenic cysts: Secondary | ICD-10-CM | POA: Diagnosis not present

## 2018-10-27 MED FILL — HYDROmorphone HCL 2 MG TABS: 2 | 2 days supply | Qty: 8 | Fill #0

## 2018-11-10 MED FILL — CITALOPRAM HBR 20 MG TABLET: 20 | 90 days supply | Qty: 90 | Fill #1

## 2018-12-19 ENCOUNTER — Telehealth: Payer: 59 | Admitting: Physician Assistant

## 2018-12-19 ENCOUNTER — Encounter: Payer: Self-pay | Admitting: Physician Assistant

## 2018-12-19 DIAGNOSIS — R6883 Chills (without fever): Secondary | ICD-10-CM

## 2018-12-19 DIAGNOSIS — R059 Cough, unspecified: Secondary | ICD-10-CM

## 2018-12-19 DIAGNOSIS — R05 Cough: Secondary | ICD-10-CM

## 2018-12-19 MED ORDER — BENZONATATE 100 MG PO CAPS: 100.0000 mg | ORAL_CAPSULE | Freq: Two times a day (BID) | ORAL | 0 refills | Status: DC | PRN

## 2018-12-19 NOTE — Progress Notes (Signed)
E-Visit for Corona Virus Screening  Based on your current symptoms, you may very well have the virus, however your symptoms are mild. Currently, not all patients are being tested. If the symptoms are mild and there is not a known exposure, performing the test is not indicated.  Coronavirus disease 2019 (COVID-19) is a respiratory illness that can spread from person to person. The virus that causes COVID-19 is a new virus that was first identified in the country of [place] but is now found in multiple other countries and has spread to the [place].  Symptoms associated with the virus are mild to severe fever, cough, and shortness of breath. There is currently no vaccine to protect against COVID-19, and there is no specific antiviral treatment for the virus.   To be considered HIGH RISK for Coronavirus (COVID-19), you have to meet the following criteria:  . Traveled to [place], [place], [place], [place] or [place]; or in the [place] to [place], [place], [place], or [place]; and have fever, cough, and shortness of breath within the last 2 weeks of travel OR  . Been in close contact with a person diagnosed with COVID-19 within the last 2 weeks and have fever, cough, and shortness of breath  . IF YOU DO NOT MEET THESE CRITERIA, YOU ARE CONSIDERED LOW RISK FOR COVID-19.   It is vitally important that if you feel that you have an infection such as this virus or any other virus that you stay home and away from places where you may spread it to others.  You should self-quarantine for 14 days if you have symptoms that could potentially be coronavirus and avoid contact with people age 30 and older.   You can use medication such as A prescription cough medication called Tessalon Perles 100 mg. You may take 1-2 capsules every 8 hours as needed for cough    You may also take acetaminophen (Tylenol) as needed for fever.   Ms. Lichtman Chills are considered subjective fever, and due to community  spread and the risk of exposure due to the nature of your job, I would consider you at risk for the COVID  Reduce your risk of any infection by using the same precautions used for avoiding the common cold or flu:  Marland Kitchen Wash your hands often with soap and warm water for at least 20 seconds.  If soap and water are not readily available, use an alcohol-based hand sanitizer with at least 60% alcohol.  . If coughing or sneezing, cover your mouth and nose by coughing or sneezing into the elbow areas of your shirt or coat, into a tissue or into your sleeve (not your hands). . Avoid shaking hands with others and consider head nods or verbal greetings only. . Avoid touching your eyes, nose, or mouth with unwashed hands.  . Avoid close contact with people who are sick. . Avoid places or events with large numbers of people in one location, like concerts or sporting events. . Carefully consider travel plans you have or are making. . If you are planning any travel outside or inside the [place], visit the CDC's Travelers' Health webpage for the latest health notices. . If you have some symptoms but not all symptoms, continue to monitor at home and seek medical attention if your symptoms worsen. . If you are having a medical emergency, call 911.  HOME CARE . Only take medications as instructed by your medical team. . Drink plenty of fluids and get  plenty of rest. . A steam or ultrasonic humidifier can help if you have congestion.   GET HELP RIGHT AWAY IF: . You develop worsening fever. . You become short of breath . You cough up blood. . Your symptoms become more severe MAKE SURE YOU   Understand these instructions.  Will watch your condition.  Will get help right away if you are not doing well or get worse.  Your e-visit answers were reviewed by a board certified advanced clinical practitioner to complete your personal care plan.  Depending on the condition, your plan could have included both over the  counter or prescription medications.  If there is a problem please reply once you have received a response from your provider. Your safety is important to [place].  If you have drug allergies check your prescription carefully.    You can use MyChart to ask questions about today's visit, request a non-urgent call back, or ask for a work or school excuse for 24 hours related to this e-Visit. If it has been greater than 24 hours you will need to follow up with your provider, or enter a new e-Visit to address those concerns. You will get an e-mail in the next two days asking about your experience.  I hope that your e-visit has been valuable and will speed your recovery. Thank you for using e-visits.    I have spent 7 min in completion and review of this note- Illa Level [facility]

## 2018-12-25 ENCOUNTER — Other Ambulatory Visit: Payer: Self-pay

## 2018-12-25 ENCOUNTER — Encounter (HOSPITAL_COMMUNITY): Payer: Self-pay

## 2018-12-25 ENCOUNTER — Emergency Department (HOSPITAL_COMMUNITY)
Admission: EM | Admit: 2018-12-25 | Discharge: 2018-12-25 | Disposition: A | Discharge status: Home / Self Care | Payer: 59 | Attending: Emergency Medicine | Admitting: Emergency Medicine

## 2018-12-25 ENCOUNTER — Ambulatory Visit (INDEPENDENT_AMBULATORY_CARE_PROVIDER_SITE_OTHER): Payer: 59 | Admitting: Osteopathic Medicine

## 2018-12-25 ENCOUNTER — Emergency Department (HOSPITAL_COMMUNITY): Payer: 59

## 2018-12-25 ENCOUNTER — Encounter: Payer: Self-pay | Admitting: Osteopathic Medicine

## 2018-12-25 ENCOUNTER — Telehealth: Payer: 59 | Admitting: Family

## 2018-12-25 VITALS — BP 134/77 | HR 81 | Temp 98.2°F | Wt 184.3 lb

## 2018-12-25 DIAGNOSIS — Z7982 Long term (current) use of aspirin: Secondary | ICD-10-CM | POA: Diagnosis not present

## 2018-12-25 DIAGNOSIS — I10 Essential (primary) hypertension: Secondary | ICD-10-CM | POA: Insufficient documentation

## 2018-12-25 DIAGNOSIS — R0602 Shortness of breath: Secondary | ICD-10-CM | POA: Diagnosis not present

## 2018-12-25 DIAGNOSIS — R6889 Other general symptoms and signs: Secondary | ICD-10-CM | POA: Diagnosis not present

## 2018-12-25 DIAGNOSIS — R05 Cough: Secondary | ICD-10-CM

## 2018-12-25 DIAGNOSIS — R9431 Abnormal electrocardiogram [ECG] [EKG]: Secondary | ICD-10-CM | POA: Diagnosis not present

## 2018-12-25 DIAGNOSIS — J101 Influenza due to other identified influenza virus with other respiratory manifestations: Secondary | ICD-10-CM | POA: Diagnosis not present

## 2018-12-25 DIAGNOSIS — R509 Fever, unspecified: Secondary | ICD-10-CM | POA: Diagnosis not present

## 2018-12-25 DIAGNOSIS — R059 Cough, unspecified: Secondary | ICD-10-CM

## 2018-12-25 DIAGNOSIS — R6883 Chills (without fever): Secondary | ICD-10-CM | POA: Diagnosis not present

## 2018-12-25 DIAGNOSIS — R69 Illness, unspecified: Secondary | ICD-10-CM

## 2018-12-25 DIAGNOSIS — J111 Influenza due to unidentified influenza virus with other respiratory manifestations: Secondary | ICD-10-CM

## 2018-12-25 DIAGNOSIS — Z79899 Other long term (current) drug therapy: Secondary | ICD-10-CM | POA: Insufficient documentation

## 2018-12-25 DIAGNOSIS — R079 Chest pain, unspecified: Secondary | ICD-10-CM | POA: Diagnosis not present

## 2018-12-25 LAB — BASIC METABOLIC PANEL
Anion gap: 7 (ref 5–15)
BUN: 12 mg/dL (ref 6–20)
CO2: 27 mmol/L (ref 22–32)
Calcium: 9.2 mg/dL (ref 8.9–10.3)
Chloride: 106 mmol/L (ref 98–111)
Creatinine, Ser: 0.78 mg/dL (ref 0.44–1.00)
GFR calc Af Amer: >60 mL/min (ref >60)
GFR calc non Af Amer: >60 mL/min (ref >60)
Glucose, Bld: 90 mg/dL (ref 70–99)
Potassium: 4.1 mmol/L (ref 3.5–5.1)
SODIUM: 140 mmol/L (ref 135–145)

## 2018-12-25 LAB — CBC WITH DIFFERENTIAL/PLATELET
Abs Immature Granulocytes: 0.01 10*3/uL (ref 0.00–0.07)
Basophils Absolute: 0 10*3/uL (ref 0.0–0.1)
Basophils Relative: 0 %
EOS PCT: 2 %
Eosinophils Absolute: 0.1 10*3/uL (ref 0.0–0.5)
HCT: 40.9 % (ref 36.0–46.0)
Hemoglobin: 13.6 g/dL (ref 12.0–15.0)
Immature Granulocytes: 0 %
Lymphocytes Relative: 43 %
Lymphs Abs: 2.5 10*3/uL (ref 0.7–4.0)
MCH: 31 pg (ref 26.0–34.0)
MCHC: 33.3 g/dL (ref 30.0–36.0)
MCV: 93.2 fL (ref 80.0–100.0)
Monocytes Absolute: 0.5 10*3/uL (ref 0.1–1.0)
Monocytes Relative: 9 %
Neutro Abs: 2.6 10*3/uL (ref 1.7–7.7)
Neutrophils Relative %: 46 %
Platelets: 302 10*3/uL (ref 150–400)
RBC: 4.39 MIL/uL (ref 3.87–5.11)
RDW: 13 % (ref 11.5–15.5)
WBC: 5.7 10*3/uL (ref 4.0–10.5)
nRBC: 0 % (ref 0.0–0.2)

## 2018-12-25 LAB — TROPONIN I

## 2018-12-25 LAB — D-DIMER, QUANTITATIVE (NOT AT ARMC): D DIMER QUANT: 0.32 ug{FEU}/mL (ref 0.00–0.50)

## 2018-12-25 MED ORDER — KETOROLAC TROMETHAMINE 30 MG/ML IJ SOLN
30.0000 mg | Freq: Once | INTRAMUSCULAR | Status: AC
  Administered 2018-12-25: 30 mg via INTRAVENOUS
  Filled 2018-12-25: qty 1

## 2018-12-25 MED ORDER — GABAPENTIN 300 MG PO CAPS: 300.0000 mg | ORAL_CAPSULE | Freq: Three times a day (TID) | ORAL | 0 refills | Status: DC | PRN

## 2018-12-25 MED ORDER — ALBUTEROL SULFATE HFA 108 (90 BASE) MCG/ACT IN AERS
2.0000 | INHALATION_SPRAY | RESPIRATORY_TRACT | Status: DC | PRN
  Administered 2018-12-25: 2 via RESPIRATORY_TRACT
  Filled 2018-12-25: qty 6.7

## 2018-12-25 NOTE — ED Provider Notes (Signed)
[facility] EMERGENCY DEPARTMENT Provider Note   CSN: [id number] Arrival date & time: 12/25/18  1406    History   Chief Complaint Chief Complaint  Patient presents with  . Shortness of Breath  . Chest Pain    HPI TYMESHA DITMORE is a 53 y.o. female.     The history is provided by the patient and medical records. No language interpreter was used.  Shortness of Breath  Associated symptoms: chest pain, cough and fever   Chest Pain  Associated symptoms: cough, fever and shortness of breath   Associated symptoms: no palpitations    NAHAL WANLESS is a 53 y.o. female  with a PMH as listed below who presents to the Emergency Department complaining of shortness of breath.  Patient states that she initially began having cough and body aches beginning on 3/23 (1 week ago).  Over the next several days, she developed low-grade temperature, 99-100.  No fevers higher than 100 degrees.  She became extremely tired as well.  Late last night, she developed shortness of breath and dyspnea with exertion.  She states that she walked to the bathroom and had to sit there for 15 minutes just to catch her breath to make it back to the bed.  She had a telemedicine visit today who told her that she needed to be seen in person.  She then saw her primary care doctor who recommended that she go to the emergency department.  She is a Engineer, civil (consulting) with [employer].  She has been out of work for the last week quarantining herself.  She did travel to [place] about 3 weeks ago.  She denies any leg swelling or calf pain.  She does report central chest pain which began this morning as well.  Pain is not worse with inspiration.  Past Medical History:  Diagnosis Date  . GERD (gastroesophageal reflux disease)   . Heartburn   . Hyperlipidemia   . Hypertension   . IBS (irritable bowel syndrome)   . PONV (postoperative nausea and vomiting)    severe  . Prediabetes   . Sphincter of Oddi  dysfunction     Patient Active Problem List   Diagnosis Date Noted  . Elevated transaminase measurement 11/02/2017  . Right foot pain 01/27/2017  . History of high cholesterol 05/27/2016  . History of gestational diabetes 05/27/2016  . History of gestational hypertension 05/27/2016  . History of acute pancreatitis 05/27/2016  . Acid reflux 04/17/2015  . Prediabetes 04/17/2015  . 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 Hyperlipemia 01/02/2015  . Dysfunction of sphincter of Oddi 08/16/2013    Past Surgical History:  Procedure Laterality Date  . ABDOMINOPLASTY  05/25/2012  . CESAREAN SECTION  1991, 1998   x2  . CHOLECYSTECTOMY  1995  . IRRIGATION AND DEBRIDEMENT ABSCESS Right 05/22/2013   Procedure: MINOR INCISION AND DRAINAGE OF ABSCESS;  Surgeon: Nicki Reaper, MD;  Location: [facility];  Service: Orthopedics;  Laterality: Right;  . KNEE ARTHROSCOPY Left    x3  . LAPAROSCOPIC GASTRIC SLEEVE RESECTION N/A 03/25/2015   Procedure: LAPAROSCOPIC GASTRIC SLEEVE RESECTION UPPER ENDOSCOPY;  Surgeon: Luretha Murphy, MD;  Location: [facility];  Service: General;  Laterality: N/A;  . VAGINAL BIRTH AFTER CESAREAN SECTION  1993, 2001   x2     OB History   No obstetric history on file.      Home Medications    Prior to Admission medications  Medication Sig Start Date End Date Taking? Authorizing Provider  aspirin 81 MG tablet Take 81 mg by mouth daily.    [provider]  citalopram (CELEXA) 20 MG tablet TAKE 1 TABLET (20 MG TOTAL) BY MOUTH DAILY. 06/05/18   Sunnie Nielsen, DO  gabapentin (NEURONTIN) 300 MG capsule Take 1 capsule (300 mg total) by mouth 3 (three) times daily as needed. 12/25/18   Sunnie Nielsen, DO  Multiple Vitamins-Minerals (MULTIVITAMIN ADULT PO) Take by mouth daily.    [provider]  Omega-3 Krill Oil 500 MG CAPS Take 1,200 mg by mouth daily.    [provider]    Family History Family  History  Problem Relation Age of Onset  . Hypertension Mother   . Hyperlipidemia Mother   . Fibromyalgia Mother   . Hypertension Father   . Breast cancer Maternal Grandmother 60  . Breast cancer Cousin 45  . Colon cancer Neg Hx   . Esophageal cancer Neg Hx   . Rectal cancer Neg Hx   . Stomach cancer Neg Hx     Social History Social History   Tobacco Use  . Smoking status: Never Smoker  . Smokeless tobacco: Never Used  Substance Use Topics  . Alcohol use: Yes    Comment: rare  . Drug use: No     Allergies   Patient has no known allergies.   Review of Systems Review of Systems  Constitutional: Positive for chills and fever.  Respiratory: Positive for cough and shortness of breath.   Cardiovascular: Positive for chest pain. Negative for palpitations and leg swelling.  Musculoskeletal: Positive for myalgias.  All other systems reviewed and are negative.    Physical Exam Updated Vital Signs BP (!) 146/92 (BP Location: Right Arm)   Pulse 72   Temp 98 F (36.7 C) (Oral)   Ht 5\' 1"  (1.549 m)   Wt 83.5 kg   SpO2 99%   BMI 34.77 kg/m   Physical Exam Vitals signs and nursing note reviewed.  Constitutional:      General: She is not in acute distress.    Appearance: She is well-developed.  HENT:     Head: Normocephalic and atraumatic.  Neck:     Musculoskeletal: Neck supple.  Cardiovascular:     Rate and Rhythm: Normal rate and regular rhythm.     Heart sounds: Normal heart sounds. No murmur.  Pulmonary:     Effort: No respiratory distress.     Breath sounds: Normal breath sounds.     Comments: Tachypneic.  Gets winded after half to full sentence. Abdominal:     General: There is no distension.     Palpations: Abdomen is soft.     Tenderness: There is no abdominal tenderness.  Skin:    General: Skin is warm and dry.  Neurological:     Mental Status: She is alert and oriented to person, place, and time.      ED Treatments / Results  Labs (all labs  ordered are listed, but only abnormal results are displayed) Labs Reviewed  CBC WITH DIFFERENTIAL/PLATELET  BASIC METABOLIC PANEL  TROPONIN I  D-DIMER, QUANTITATIVE (NOT AT [facility])    EKG EKG Interpretation  Date/Time:  Monday December 25 2018 14:36:28 EDT Ventricular Rate:  66 PR Interval:    QRS Duration: 92 QT Interval:  423 QTC Calculation: 444 R Axis:   26 Text Interpretation:  Sinus rhythm RSR' in V1 or V2, probably normal variant No significant change since last tracing  Confirmed by Melene Plan [phone]) on 12/25/2018 2:50:05 PM   Radiology Dg Chest Portable 1 View  Result Date: 12/25/2018 CLINICAL DATA:  Acute onset of nonproductive cough, shortness of breath and LEFT-sided chest pain that began last night. Intermittent low-grade fevers. Patient traveled to [place] approximately 3 weeks ago. 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 EXAM: PORTABLE CHEST 1 VIEW COMPARISON:  08/27/2016 and earlier. FINDINGS: Cardiomediastinal silhouette unremarkable and unchanged, allowing for differences in technique. Lungs clear. Bronchovascular markings normal. Pulmonary vascularity normal. No visible pleural effusions. No pneumothorax. IMPRESSION: No acute cardiopulmonary disease. Electronically Signed   By: Hulan Saas M.D.   On: 12/25/2018 15:09    Procedures Procedures (including critical care time)  Medications Ordered in ED Medications  albuterol (PROVENTIL HFA;VENTOLIN HFA) 108 (90 Base) MCG/ACT inhaler 2 puff (has no administration in time range)  ketorolac (TORADOL) 30 MG/ML injection 30 mg (30 mg Intravenous Given 12/25/18 1553)     Initial Impression / Assessment and Plan / ED Course  I have reviewed the triage vital signs and the nursing notes.  Pertinent labs & imaging results that were available during my care of the patient were reviewed by me and considered in my medical decision making (see chart for details).        SHENAI BINKLEY was evaluated in Emergency Department on 12/25/2018 for the symptoms described in the history of present illness. She was evaluated in the context of the global COVID-19 pandemic, which necessitated consideration that the patient might be at risk for infection with the SARS-CoV-2 virus that causes COVID-19. Institutional protocols and algorithms that pertain to the evaluation of patients at risk for COVID-19 are in a state of rapid change based on information released by regulatory bodies including the CDC and federal and state organizations. These policies and algorithms were followed during the patient's care in the ED.  She presents to ED today for 1 week of URI symptoms. This morning, she became short of breath and also began having central to left-sided chest pain. Saw her PCP who recommended that she come to ED for further evaluation. In ED, she is oxygenating well with O2 sats 99-100% while speaking to me. She is afebrile. CXR is negative. EKG without acute ischemic changes. She does appear tachypneic during eval. Given recent travel, will obtain d-dimer with labs. 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 Care assumed by Dr. Adela Lank who will follow up on pending labs and dispo appropriately.    Patient discussed with Dr. Adela Lank who agrees with treatment plan.     Final Clinical Impressions(s) / ED Diagnoses   Final diagnoses:  None    ED Discharge Orders    None       Alfonzia Woolum, Chase Picket, PA-C 12/25/18 1554    Melene Plan, DO 12/25/18 [phone]

## 2018-12-25 NOTE — Progress Notes (Signed)
HPI: Kristine Stevens is a 53 y.o. female who  has a past medical history of GERD (gastroesophageal reflux disease), Heartburn, Hyperlipidemia, Hypertension, IBS (irritable bowel syndrome), PONV (postoperative nausea and vomiting), Prediabetes, and Sphincter of Oddi dysfunction.  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 E-visit last Tuesday wrote me out for 14 days. Not tested for anything. Pain in left chest with burning sensation. Better this morning but last night was frightening with RR in the 40s and chest pain radiating to my back. I don't want to go to the ED as long as I can breathe on my own. SOB continues and I have difficulty getting a deep breath. No congestion, occasional dry cough." E-visit provider recommended f45f evaluation given RR.   Pt reports SOB and chest pain today, feels like difficulty taking a deep breath in, feeling short winded with normal daily activities. Significant fatigue and body aches. Cannot speak in full sentences. Reports anosmia.       Past medical, surgical, social and family history reviewed and updated as necessary.   Current medication list and allergy/intolerance information reviewed:    Current Outpatient Medications  Medication Sig Dispense Refill  . aspirin 81 MG tablet Take 81 mg by mouth daily.    . citalopram (CELEXA) 20 MG tablet TAKE 1 TABLET (20 MG TOTAL) BY MOUTH DAILY. 90 tablet 1  . Multiple Vitamins-Minerals (MULTIVITAMIN ADULT PO) Take by mouth daily.    Marland Kitchen gabapentin (NEURONTIN) 300 MG capsule Take 1 capsule (300 mg total) by mouth 3 (three) times daily as needed. 60 capsule 0  . Omega-3 Krill Oil 500 MG CAPS Take 1,200 mg by mouth daily.     No current facility-administered medications for this visit.     No Known Allergies    Review of  Systems:  Constitutional:  +subjective fever, +chills, +recent illness, No unintentional weight changes. +significant fatigue.   HEENT: +headache, no vision change, no hearing change, No sore throat, No  sinus pressure  Cardiac: +chest pain, No  pressure, No palpitations  Respiratory:  +shortness of breath. +Cough  Gastrointestinal: No  abdominal pain, No  nausea, No  vomiting,  No  blood in stool, No  diarrhea  Musculoskeletal: +generalized myalgia/arthralgia  Skin: No  Rash  Neurologic: No  weakness, No  dizziness  Exam:  BP 134/77 (BP Location: Left Arm, Patient Position: Sitting, Cuff Size: Normal)   Pulse 81   Temp 98.2 F (36.8 C) (Oral)   Wt 184 lb 4.8 oz (83.6 kg)   SpO2 99%   BMI 34.82 kg/m   Constitutional: VS see above. General Appearance: alert, well-developed, well-nourished. Stopping to take breaths mid-sentence. Lying down on table when I walked into the room.   Eyes: Normal lids and conjunctive, non-icteric sclera  Ears, Nose, Mouth, Throat: MMM, Normal external inspection ears/nares/mouth/lips/gums.  Neck: No masses, trachea midline. No tenderness/mass appreciated. No lymphadenopathy  Respiratory: Normal respiratory effort. no wheeze, no rhonchi, +diffuse rales  Cardiovascular: S1/S2 normal, no murmur, no rub/gallop auscultated. RRR. No lower extremity edema.   Gastrointestinal: Nontender, no masses. Bowel sounds normal.  Musculoskeletal: Gait normal.   Neurological: Normal balance/coordination. No tremor.   Skin: warm, dry, intact.   Psychiatric: Normal judgment/insight. Normal mood and affect.     ASSESSMENT/PLAN: The primary encounter diagnosis was Abnormal findings on auscultation.  Diagnoses of Cough, Shortness of breath, and Chills were also pertinent to this visit.   With abnormal lung sounds, recent significant worsening of symptoms, abnormal lung sounds and healthcare worker, I am concerned for potential novel coronavirus/COVID19  illness. We are unable to perform CT scan in the outpatient setting for people with respiratory issues, so I think given lung sounds abnormal plus chest pain radiating to back, needs CT scan and should go to ER. Vitals are stable except RR and increased WOB are concerning.   Pt requested refill of gabapentin which has been helping with body aches.   Sent to ER by private vehicle to [facility], I called ER nurse intake and gave report on the patient.       Visit summary with medication list and pertinent instructions was printed for patient to review. All questions at time of visit were answered - patient instructed to contact office with any additional concerns or updates. ER/RTC precautions were reviewed with the patient.   Follow-up plan: Return if symptoms worsen or fail to improve / depending on ER results .     Please note: voice recognition software was used to produce this document, and typos may escape review. Please contact Dr. Lyn Hollingshead for any needed clarifications.

## 2018-12-25 NOTE — ED Triage Notes (Signed)
Pt has had generalized body aches for 1 week. 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 Unsure of exposure to covid 19. Since midnight pt has had SOB chest tightness and burning. Temp at home 99-100. Afebrile during triage. Traveled to [place] 3 weeks ago

## 2018-12-25 NOTE — Progress Notes (Signed)
Based on what you shared with me, I feel your condition warrants further evaluation and I recommend that you be seen for a face to face office visit.   I am concerned about your RR being in the 40's we need to rule out pneumonia.    NOTE: If you entered your credit card information for this eVisit, you will not be charged. You may see a "hold" on your card for the $35 but that hold will drop off and you will not have a charge processed.  If you are having a true medical emergency please call 911.  If you need an urgent face to face visit, [place] has four urgent care centers for your convenience.    PLEASE NOTE: THE [facility] LOCATIONS AND URGENT CARE CLINICS DO NOT HAVE THE TESTING FOR CORONAVIRUS COVID19 AVAILABLE.  IF YOU FEEL YOU NEED THIS TEST YOU MUST GO TO A TRIAGE LOCATION AT ONE OF THE HOSPITAL EMERGENCY DEPARTMENTS  The following sites will take your insurance:  . [facility]  [phone] Get Driving Directions Find a Provider at this Location  [address] [place], [place] [address] . 10 am to 8 pm Monday-Friday . 12 pm to 8 pm Saturday-Sunday   . [facility] at [facility]  [phone] Get Driving Directions Find a Provider at this Location  [address], [address] [place], [place] [address] . 8 am to 8 pm Monday-Friday . 9 am to 6 pm Saturday . 11 am to 6 pm Sunday   . [facility] at [facility]  [phone] Get Driving Directions  [address] [place], [place] [address] . 8 am to 8 pm Monday-Friday . 8 am to 4 pm Saturday-Sunday   Your e-visit answers were reviewed by a board certified advanced clinical practitioner to complete your personal care plan.  Thank you for using e-Visits.

## 2018-12-25 NOTE — Discharge Instructions (Signed)
Person Under Monitoring Name: Kristine Stevens  Location: [address] [place] [address]   Infection Prevention Recommendations for Individuals Confirmed to have, or Being Evaluated for, 2019 Novel Coronavirus (COVID-19) Infection Who Receive Care at Home  Individuals who are confirmed to have, or are being evaluated for, COVID-19 should follow the prevention steps below until a healthcare provider or local or state health department says they can return to normal activities.  Stay home except to get medical care You should restrict activities outside your home, except for getting medical care. Do not go to work, school, or public areas, and do not use public transportation or taxis.  Call ahead before visiting your doctor Before your medical appointment, call the healthcare provider and tell them that you have, or are being evaluated for, COVID-19 infection. This will help the healthcare providers office take steps to keep other people from getting infected. Ask your healthcare provider to call the local or state health department.  Monitor your symptoms Seek prompt medical attention if your illness is worsening (e.g., difficulty breathing). Before going to your medical appointment, call the healthcare provider and tell them that you have, or are being evaluated for, COVID-19 infection. Ask your healthcare provider to call the local or state health department.  Wear a facemask You should wear a facemask that covers your nose and mouth when you are in the same room with other people and when you visit a healthcare provider. People who live with or visit you should also wear a facemask while they are in the same room with you.  Separate yourself from other people in your home As much as possible, you should stay in a different room from other people in your home. Also, you should use a separate bathroom, if available.  Avoid sharing household items You should not share  dishes, drinking glasses, cups, eating utensils, towels, bedding, or other items with other people in your home. After using these items, you should wash them thoroughly with soap and water.  Cover your coughs and sneezes Cover your mouth and nose with a tissue when you cough or sneeze, or you can cough or sneeze into your sleeve. Throw used tissues in a lined trash can, and immediately wash your hands with soap and water for at least 20 seconds or use an alcohol-based hand rub.  Wash your [employer] your hands often and thoroughly with soap and water for at least 20 seconds. You can use an alcohol-based hand sanitizer if soap and water are not available and if your hands are not visibly dirty. Avoid touching your eyes, nose, and mouth with unwashed hands.   Prevention Steps for Caregivers and Household Members of Individuals Confirmed to have, or Being Evaluated for, COVID-19 Infection Being Cared for in the Home  If you live with, or provide care at home for, a person confirmed to have, or being evaluated for, COVID-19 infection please follow these guidelines to prevent infection:  Follow healthcare providers instructions Make sure that you understand and can help the patient follow any healthcare provider instructions for all care.  Provide for the patients basic needs You should help the patient with basic needs in the home and provide support for getting groceries, prescriptions, and other personal needs.  Monitor the patients symptoms If they are getting sicker, call his or her medical provider and tell them that the patient has, or is being evaluated for, COVID-19 infection. This will help the healthcare providers office  take steps to keep other people from getting infected. °Ask the healthcare provider to call the local or state health department. ° °Limit the number of people who have contact with the patient °If possible, have only one caregiver for the patient. °Other  household members should stay in another home or place of residence. If this is not possible, they should stay °in another room, or be separated from the patient as much as possible. Use a separate bathroom, if available. °Restrict visitors who do not have an essential need to be in the home. ° °Keep older adults, very young children, and other sick people away from the patient °Keep older adults, very young children, and those who have compromised immune systems or chronic health conditions away from the patient. This includes people with chronic heart, lung, or kidney conditions, diabetes, and cancer. ° °Ensure good ventilation °Make sure that shared spaces in the home have good air flow, such as from an air conditioner or an opened window, °weather permitting. ° °Wash your hands often °Wash your hands often and thoroughly with soap and water for at least 20 seconds. You can use an alcohol based hand sanitizer if soap and water are not available and if your hands are not visibly dirty. °Avoid touching your eyes, nose, and mouth with unwashed hands. °Use disposable paper towels to dry your hands. If not available, use dedicated cloth towels and replace them when they become wet. ° °Wear a facemask and gloves °Wear a disposable facemask at all times in the room and gloves when you touch or have contact with the patient’s blood, body fluids, and/or secretions or excretions, such as sweat, saliva, sputum, nasal mucus, vomit, urine, or feces.  Ensure the mask fits over your nose and mouth tightly, and do not touch it during use. °Throw out disposable facemasks and gloves after using them. Do not reuse. °Wash your hands immediately after removing your facemask and gloves. °If your personal clothing becomes contaminated, carefully remove clothing and launder. Wash your hands after handling contaminated clothing. °Place all used disposable facemasks, gloves, and other waste in a lined container before disposing them with  other household waste. °Remove gloves and wash your hands immediately after handling these items. ° °Do not share dishes, glasses, or other household items with the patient °Avoid sharing household items. You should not share dishes, drinking glasses, cups, eating utensils, towels, bedding, or other items with a patient who is confirmed to have, or being evaluated for, COVID-19 infection. °After the person uses these items, you should wash them thoroughly with soap and water. ° °Wash laundry thoroughly °Immediately remove and wash clothes or bedding that have blood, body fluids, and/or secretions or excretions, such as sweat, saliva, sputum, nasal mucus, vomit, urine, or feces, on them. °Wear gloves when handling laundry from the patient. °Read and follow directions on labels of laundry or clothing items and detergent. In general, wash and dry with the warmest temperatures recommended on the label. ° °Clean all areas the individual has used often °Clean all touchable surfaces, such as counters, tabletops, doorknobs, bathroom fixtures, toilets, phones, keyboards, tablets, and bedside tables, every day. Also, clean any surfaces that may have blood, body fluids, and/or secretions or excretions on them. °Wear gloves when cleaning surfaces the patient has come in contact with. °Use a diluted bleach solution (e.g., dilute bleach with 1 part bleach and 10 parts water) or a household disinfectant with a label that says EPA-registered for coronaviruses. To make a bleach   solution at home, add 1 tablespoon of bleach to 1 quart (4 cups) of water. For a larger supply, add  cup of bleach to 1 gallon (16 cups) of water. Read labels of cleaning products and follow recommendations provided on product labels. Labels contain instructions for safe and effective use of the cleaning product including precautions you should take when applying the product, such as wearing gloves or eye protection and making sure you have good ventilation  during use of the product. Remove gloves and wash hands immediately after cleaning.  Monitor yourself for signs and symptoms of illness Caregivers and household members are considered close contacts, should monitor their health, and will be asked to limit movement outside of the home to the extent possible. Follow the monitoring steps for close contacts listed on the symptom monitoring form.   ? If you have additional questions, contact your local health department or call the epidemiologist on call at [phone] (available 24/7). ? This guidance is subject to change. For the most up-to-date guidance from [facility], please refer to their website: [email or web address]

## 2018-12-25 NOTE — ED Notes (Signed)
Patient verbalizes understanding of discharge instructions. Opportunity for questioning and answers were provided. Armband removed by staff, pt discharged from ED.  

## 2019-01-10 ENCOUNTER — Encounter: Payer: Self-pay | Admitting: Osteopathic Medicine

## 2019-02-05 ENCOUNTER — Encounter: Payer: Self-pay | Admitting: Osteopathic Medicine

## 2019-02-05 DIAGNOSIS — M3500 Sicca syndrome, unspecified: Secondary | ICD-10-CM | POA: Insufficient documentation

## 2019-02-21 ENCOUNTER — Other Ambulatory Visit: Payer: Self-pay | Admitting: Osteopathic Medicine

## 2019-02-21 ENCOUNTER — Other Ambulatory Visit: Payer: Self-pay

## 2019-02-21 ENCOUNTER — Encounter: Payer: Self-pay | Admitting: Osteopathic Medicine

## 2019-02-21 MED ORDER — GABAPENTIN 300 MG PO CAPS: 300.0000 mg | ORAL_CAPSULE | Freq: Three times a day (TID) | ORAL | 0 refills | Status: DC | PRN

## 2019-02-21 MED ORDER — GABAPENTIN 300 MG PO CAPS: 600.0000 mg | ORAL_CAPSULE | Freq: Every evening | ORAL | 1 refills | Status: DC | PRN

## 2019-02-21 MED FILL — CITALOPRAM HBR 20 MG TABLET: 20 | 90 days supply | Qty: 90 | Fill #0

## 2019-02-21 MED FILL — GABAPENTIN 300 MG CAPSULE: 300 | 20 days supply | Qty: 60 | Fill #0

## 2019-04-12 ENCOUNTER — Ambulatory Visit (INDEPENDENT_AMBULATORY_CARE_PROVIDER_SITE_OTHER): Payer: 59 | Admitting: Osteopathic Medicine

## 2019-04-12 ENCOUNTER — Other Ambulatory Visit (HOSPITAL_COMMUNITY)
Admission: RE | Admit: 2019-04-12 | Discharge: 2019-04-12 | Disposition: A | Discharge status: Home / Self Care | Payer: 59 | Source: Ambulatory Visit | Attending: Osteopathic Medicine | Admitting: Osteopathic Medicine

## 2019-04-12 ENCOUNTER — Encounter: Payer: Self-pay | Admitting: Osteopathic Medicine

## 2019-04-12 ENCOUNTER — Other Ambulatory Visit: Payer: Self-pay

## 2019-04-12 VITALS — BP 148/92 | HR 83 | Temp 98.1°F | Wt 187.8 lb

## 2019-04-12 DIAGNOSIS — Z124 Encounter for screening for malignant neoplasm of cervix: Secondary | ICD-10-CM | POA: Insufficient documentation

## 2019-04-12 DIAGNOSIS — Z87898 Personal history of other specified conditions: Secondary | ICD-10-CM | POA: Diagnosis not present

## 2019-04-12 DIAGNOSIS — M3500 Sicca syndrome, unspecified: Secondary | ICD-10-CM | POA: Diagnosis not present

## 2019-04-12 DIAGNOSIS — Z0000 Encounter for general adult medical examination without abnormal findings: Secondary | ICD-10-CM | POA: Insufficient documentation

## 2019-04-12 DIAGNOSIS — Z1239 Encounter for other screening for malignant neoplasm of breast: Secondary | ICD-10-CM

## 2019-04-12 DIAGNOSIS — Z789 Other specified health status: Secondary | ICD-10-CM | POA: Diagnosis not present

## 2019-04-12 MED ORDER — HYDROCHLOROTHIAZIDE 12.5 MG PO TABS: 12.5000 mg | ORAL_TABLET | Freq: Every day | ORAL | 0 refills | Status: DC

## 2019-04-12 MED FILL — HYDROCHLOROTHIAZIDE 12.5 MG: 12.5 | 30 days supply | Qty: 30 | Fill #0

## 2019-04-12 NOTE — Patient Instructions (Addendum)
General Preventive Care  Most recent routine screening lipids/other labs: ordered today  Everyone should have blood pressure checked once per year.   Tobacco: don't!   Alcohol: responsible moderation is ok for most adults - if you have concerns about your alcohol intake, please talk to me!   Exercise: as tolerated to reduce risk of cardiovascular disease and diabetes. Strength training will also prevent osteoporosis.   Mental health: if need for mental health care (medicines, counseling, other), or concerns about moods, please let me know!   Sexual health: if need for STD testing, or if concerns with libido/pain problems, please let me know!  Advanced Directive: Living Will and/or Healthcare Power of Attorney recommended for all adults, regardless of age or health.  Vaccines  Flu vaccine: recommended for almost everyone, every fall.   Shingles vaccine: Shingrix recommended after age 13.  Pneumonia vaccines: Prevnar and Pneumovax recommended after age 38, or sooner if certain medical conditions.  Tetanus booster: Tdap recommended every 10 years.  Cancer screenings   Colon cancer screening: due 2028  Breast cancer screening: due 04/2019  Cervical cancer screening: Pap every 1 to 5 years depending on age and other risk factors. Can usually stop at age 98 or w/ hysterectomy.   Lung cancer screening: not needed for non-smokers  Infection screenings . HIV, Gonorrhea/Chlamydia: screening as needed . Hepatitis C: recommended for anyone born 51-1964 . TB: certain at-risk populations, or depending on work requirements and/or travel history Other . Bone Density Test: recommended for women at age 20, or after 69 is smoker or history of fracture                Please note: Preventive care issues were addressed today as part of your annual wellness physical, and this care should be covered under your insurance. However there were other medical issues which were also  addressed today, and insurance may bill you separately for a "problem-based visit" for this care: cholesterol and high blood pressure. Any questions or concerns about charges which may appear on your billing statement should be directed to your insurance company or to [facility] billing department, and they will contact our office if there are further concerns.

## 2019-04-12 NOTE — Progress Notes (Signed)
HPI: Kristine Stevens is a 53 y.o. female who  has a past medical history of GERD (gastroesophageal reflux disease), Heartburn, Hyperlipidemia, Hypertension, IBS (irritable bowel syndrome), PONV (postoperative nausea and vomiting), Prediabetes, and Sphincter of Oddi dysfunction.  she presents to [facility] today, 04/12/19,  for chief complaint of: Annual Physical     Patient here for annual physical / wellness exam.  See preventive care reviewed as below.  Recent labs reviewed in detail with the patient.   Additional concerns today include:   Stopped [employer], would like to consider cholesterol med  Reports elevated BP   BP Readings from Last 3 Encounters:  04/12/19 (!) 148/92  12/25/18 128/76  12/25/18 134/77         Past medical, surgical, social and family history reviewed:  Patient Active Problem List   Diagnosis Date Noted  . Statin intolerance 04/12/2019  . Sicca syndrome, unspecified (HCC) 02/05/2019  . Elevated transaminase measurement 11/02/2017  . Right foot pain 01/27/2017  . History of high cholesterol 05/27/2016  . History of gestational diabetes 05/27/2016  . History of gestational hypertension 05/27/2016  . History of acute pancreatitis 05/27/2016  . Acid reflux 04/17/2015  . Prediabetes 04/17/2015  . 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 Hyperlipemia 01/02/2015  . Dysfunction of sphincter of Oddi 08/16/2013    Past Surgical History:  Procedure Laterality Date  . ABDOMINOPLASTY  05/25/2012  . CESAREAN SECTION  1991, 1998   x2  . CHOLECYSTECTOMY  1995  . IRRIGATION AND DEBRIDEMENT ABSCESS Right 05/22/2013   Procedure: MINOR INCISION AND DRAINAGE OF ABSCESS;  Surgeon: Nicki ReaperGary R Kuzma, MD;  Location: [facility];  Service: Orthopedics;  Laterality: Right;  . KNEE ARTHROSCOPY Left    x3  . LAPAROSCOPIC GASTRIC SLEEVE RESECTION N/A 03/25/2015   Procedure: LAPAROSCOPIC GASTRIC  SLEEVE RESECTION UPPER ENDOSCOPY;  Surgeon: Luretha MurphyMatthew Martin, MD;  Location: [facility];  Service: General;  Laterality: N/A;  . VAGINAL BIRTH AFTER CESAREAN SECTION  1993, 2001   x2    Social History   Tobacco Use  . Smoking status: Never Smoker  . Smokeless tobacco: Never Used  Substance Use Topics  . Alcohol use: Yes    Comment: rare    Family History  Problem Relation Age of Onset  . Hypertension Mother   . Hyperlipidemia Mother   . Fibromyalgia Mother   . Hypertension Father   . Breast cancer Maternal Grandmother [id number]  . Breast cancer Cousin 45  . Colon cancer Neg Hx   . Esophageal cancer Neg Hx   . Rectal cancer Neg Hx   . Stomach cancer Neg Hx      Current medication list and allergy/intolerance information reviewed:    Current Outpatient Medications  Medication Sig Dispense Refill  . aspirin 81 MG tablet Take 81 mg by mouth daily.    . citalopram (CELEXA) 20 MG tablet TAKE 1 TABLET (20 MG TOTAL) BY MOUTH DAILY. 90 tablet 1  . gabapentin (NEURONTIN) 300 MG capsule Take 2 capsules (600 mg total) by mouth at bedtime as needed. 180 capsule 1  . Multiple Vitamins-Minerals (MULTIVITAMIN ADULT PO) Take by mouth daily.    . hydrochlorothiazide (HYDRODIURIL) 12.5 MG tablet Take 1 tablet (12.5 mg total) by mouth daily. 30 tablet 0  . Omega-3 Krill Oil 500 MG CAPS Take 1,200 mg by mouth daily.     No current facility-administered medications for this visit.     No  Known Allergies    Review of Systems:  Constitutional:  No  fever, no chills, No recent illness, No unintentional weight changes. No significant fatigue.   HEENT: No  headache, no vision change, no hearing change, No sore throat, No  sinus pressure  Cardiac: No  chest pain, No  pressure, No palpitations, No  Orthopnea  Respiratory:  No  shortness of breath. No  Cough  Gastrointestinal: No  abdominal pain, No  nausea, No  vomiting,  No  blood in stool, No  diarrhea, No  constipation   Musculoskeletal: No new  myalgia/arthralgia  Skin: No  Rash, No other wounds/concerning lesions  Genitourinary: No  incontinence, No  abnormal genital bleeding, No abnormal genital discharge  Hem/Onc: No  easy bruising/bleeding  Endocrine: No cold intolerance,  No heat intolerance. No polyuria/polydipsia/polyphagia   Neurologic: No  weakness, No  dizziness, No  slurred speech/focal weakness/facial droop  Psychiatric: No  concerns with depression, No  concerns with anxiety, No sleep problems, No mood problems  Exam:  BP (!) 148/92 (BP Location: Left Arm, Patient Position: Sitting, Cuff Size: Normal)   Pulse 83   Temp 98.1 F (36.7 C) (Oral)   Wt 187 lb 12.8 oz (85.2 kg)   BMI 35.48 kg/m   Constitutional: VS see above. General Appearance: alert, well-developed, well-nourished, NAD  Eyes: Normal lids and conjunctive, non-icteric sclera  Neck: No masses, trachea midline. No thyroid enlargement. No tenderness/mass appreciated. No lymphadenopathy  Respiratory: Normal respiratory effort. no wheeze, no rhonchi, no rales  Cardiovascular: S1/S2 normal, no murmur, no rub/gallop auscultated. RRR. No lower extremity edema.   Gastrointestinal: Nontender, no masses. No hepatomegaly, no splenomegaly. No hernia appreciated. Bowel sounds normal. Rectal exam deferred.   Musculoskeletal: Gait normal. No clubbing/cyanosis of digits.   Neurological: Normal balance/coordination. No tremor. No cranial nerve deficit on limited exam. Motor and sensation intact and symmetric. Cerebellar reflexes intact.   Skin: warm, dry, intact. No rash/ulcer. No concerning nevi or subq nodules on limited exam.    Psychiatric: Normal judgment/insight. Normal mood and affect. Oriented x3.       ASSESSMENT/PLAN: The primary encounter diagnosis was Annual physical exam. Diagnoses of History of impaired glucose tolerance, Sicca syndrome, unspecified ([place]), Cervical cancer screening, Breast cancer screening, and Statin intolerance were also  pertinent to this visit.  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 MM 3D SCREEN BREAST BILATERAL  . CBC  . COMPLETE METABOLIC PANEL WITH GFR  . Lipid panel  . TSH  . Hemoglobin A1c  . VITAMIN D 25 Hydroxy (Vit-D Deficiency, Fractures)    Meds ordered this encounter  Medications  . hydrochlorothiazide (HYDRODIURIL) 12.5 MG tablet    Sig: Take 1 tablet (12.5 mg total) by mouth daily.    Dispense:  30 tablet    Refill:  0    Patient Instructions  General Preventive Care  Most recent routine screening lipids/other labs: ordered today  Everyone should have blood pressure checked once per year.   Tobacco: don't!   Alcohol: responsible moderation is ok for most adults - if you have concerns about your alcohol intake, please talk to me!   Exercise: as tolerated to reduce risk of cardiovascular disease and diabetes. Strength training will also prevent osteoporosis.   Mental health: if need for mental health care (medicines, counseling, other), or  concerns about moods, please let me know!   Sexual health: if need for STD testing, or if concerns with libido/pain problems, please let me know!  Advanced Directive: Living Will and/or Healthcare Power of Attorney recommended for all adults, regardless of age or health.  Vaccines  Flu vaccine: recommended for almost everyone, every fall.   Shingles vaccine: Shingrix recommended after age 52.  Pneumonia vaccines: Prevnar and Pneumovax recommended after age 52, or sooner if certain medical conditions.  Tetanus booster: Tdap recommended every 10 years.  Cancer screenings   Colon cancer screening: due 2028  Breast cancer screening: due 04/2019  Cervical cancer screening: Pap every 1 to 5 years depending on age and other risk factors. Can usually stop at age 52 or w/ hysterectomy.    Lung cancer screening: not needed for non-smokers  Infection screenings . HIV, Gonorrhea/Chlamydia: screening as needed . Hepatitis C: recommended for anyone born 471945-1964 . TB: certain at-risk populations, or depending on work requirements and/or travel history Other . Bone Density Test: recommended for women at age 52, or after 4250 is smoker or history of fracture                Please note: Preventive care issues were addressed today as part of your annual wellness physical, and this care should be covered under your insurance. However there were other medical issues which were also addressed today, and insurance may bill you separately for a "problem-based visit" for this care: cholesterol and high blood pressure. Any questions or concerns about charges which may appear on your billing statement should be directed to your insurance company or to [facility] billing department, and they will contact our office if there are further concerns.          Visit summary with medication list and pertinent instructions was printed for patient to review. All questions at time of visit were answered - patient instructed to contact office with any additional concerns or updates. ER/RTC precautions were reviewed with the patient.    Please note: voice recognition software was used to produce this document, and typos may escape review. Please contact Dr. Lyn HollingsheadAlexander for any needed clarifications.     Follow-up plan: Return for RECHECK PENDING RESULTS / BLOOD PRESSURE .

## 2019-04-13 ENCOUNTER — Encounter: Payer: Self-pay | Admitting: Osteopathic Medicine

## 2019-04-13 LAB — LIPID PANEL
Cholesterol: 212 mg/dL — ABNORMAL HIGH (ref <200)
HDL: 42 mg/dL — ABNORMAL LOW (ref >=50)
LDL Cholesterol (Calc): 140 mg/dL (calc) — ABNORMAL HIGH
Non-HDL Cholesterol (Calc): 170 mg/dL (calc) — ABNORMAL HIGH (ref <130)
Total CHOL/HDL Ratio: 5 (calc) — ABNORMAL HIGH (ref <5.0)
Triglycerides: 160 mg/dL — ABNORMAL HIGH (ref <150)

## 2019-04-13 LAB — CYTOLOGY - PAP
Diagnosis: NEGATIVE
HPV: NOT DETECTED

## 2019-04-13 LAB — CBC
HCT: 34.7 % — ABNORMAL LOW (ref 35.0–45.0)
Hemoglobin: 11.6 g/dL — ABNORMAL LOW (ref 11.7–15.5)
MCH: 29.9 pg (ref 27.0–33.0)
MCHC: 33.4 g/dL (ref 32.0–36.0)
MCV: 89.4 fL (ref 80.0–100.0)
MPV: 9.9 fL (ref 7.5–12.5)
Platelets: 319 10*3/uL (ref 140–400)
RBC: 3.88 10*6/uL (ref 3.80–5.10)
RDW: 12.7 % (ref 11.0–15.0)
WBC: 5.6 10*3/uL (ref 3.8–10.8)

## 2019-04-13 LAB — COMPLETE METABOLIC PANEL WITH GFR
AG Ratio: 1.5 (calc) (ref 1.0–2.5)
ALT: 14 U/L (ref 6–29)
AST: 15 U/L (ref 10–35)
Albumin: 3.8 g/dL (ref 3.6–5.1)
Alkaline phosphatase (APISO): 68 U/L (ref 37–153)
BUN: 11 mg/dL (ref 7–25)
CO2: 27 mmol/L (ref 20–32)
Calcium: 8.4 mg/dL — ABNORMAL LOW (ref 8.6–10.4)
Chloride: 106 mmol/L (ref 98–110)
Creat: 0.92 mg/dL (ref 0.50–1.05)
GFR, Est African American: 83 mL/min/{1.73_m2} (ref >=60)
GFR, Est Non African American: 72 mL/min/{1.73_m2} (ref >=60)
Globulin: 2.5 g/dL (calc) (ref 1.9–3.7)
Glucose, Bld: 86 mg/dL (ref 65–139)
Potassium: 3.9 mmol/L (ref 3.5–5.3)
Sodium: 139 mmol/L (ref 135–146)
Total Bilirubin: 0.3 mg/dL (ref 0.2–1.2)
Total Protein: 6.3 g/dL (ref 6.1–8.1)

## 2019-04-13 LAB — TSH: TSH: 1.06 mIU/L

## 2019-04-13 LAB — HEMOGLOBIN A1C
Hgb A1c MFr Bld: 5.6 % of total Hgb (ref <5.7)
Mean Plasma Glucose: 114 (calc)
eAG (mmol/L): 6.3 (calc)

## 2019-04-13 LAB — VITAMIN D 25 HYDROXY (VIT D DEFICIENCY, FRACTURES): Vit D, 25-Hydroxy: 30 ng/mL (ref 30–100)

## 2019-04-13 MED ORDER — ICOSAPENT ETHYL 1 G PO CAPS: 1.0000 | ORAL_CAPSULE | Freq: Two times a day (BID) | ORAL | 3 refills | Status: DC

## 2019-04-13 MED FILL — VASCEPA 1 GM CAPSULE: 1 | 90 days supply | Qty: 180 | Fill #0

## 2019-04-13 NOTE — Addendum Note (Signed)
Addended by: Maryla Morrow on: 04/13/2019 09:44 AM   Modules accepted: Orders

## 2019-05-14 ENCOUNTER — Other Ambulatory Visit: Payer: Self-pay | Admitting: Osteopathic Medicine

## 2019-05-14 MED FILL — HYDROCHLOROTHIAZIDE 12.5 MG: 12.5 | 30 days supply | Qty: 30 | Fill #0

## 2019-05-14 MED FILL — CITALOPRAM HBR 20 MG TABLET: 20 | 90 days supply | Qty: 90 | Fill #1

## 2019-05-23 ENCOUNTER — Other Ambulatory Visit: Payer: Self-pay

## 2019-05-23 ENCOUNTER — Ambulatory Visit (INDEPENDENT_AMBULATORY_CARE_PROVIDER_SITE_OTHER): Payer: 59

## 2019-05-23 DIAGNOSIS — Z1239 Encounter for other screening for malignant neoplasm of breast: Secondary | ICD-10-CM

## 2019-05-23 DIAGNOSIS — Z1231 Encounter for screening mammogram for malignant neoplasm of breast: Secondary | ICD-10-CM | POA: Diagnosis not present

## 2019-05-23 DIAGNOSIS — Z0000 Encounter for general adult medical examination without abnormal findings: Secondary | ICD-10-CM

## 2019-05-29 DIAGNOSIS — H524 Presbyopia: Secondary | ICD-10-CM | POA: Diagnosis not present

## 2019-07-02 ENCOUNTER — Other Ambulatory Visit: Payer: Self-pay | Admitting: Osteopathic Medicine

## 2019-07-02 MED FILL — GABAPENTIN 300 MG CAPSULE: 300 | 20 days supply | Qty: 60 | Fill #0

## 2019-07-02 MED FILL — HYDROCHLOROTHIAZIDE 12.5 MG: 12.5 | 30 days supply | Qty: 30 | Fill #0

## 2019-08-13 ENCOUNTER — Encounter: Payer: Self-pay | Admitting: Osteopathic Medicine

## 2019-08-13 ENCOUNTER — Other Ambulatory Visit: Payer: Self-pay | Admitting: Osteopathic Medicine

## 2019-08-13 MED ORDER — GABAPENTIN 300 MG PO CAPS: 300.0000 mg | ORAL_CAPSULE | Freq: Two times a day (BID) | ORAL | 5 refills | Status: DC | PRN

## 2019-08-13 MED ORDER — HYDROCHLOROTHIAZIDE 12.5 MG PO TABS: 12.5000 mg | ORAL_TABLET | Freq: Every day | ORAL | 1 refills | Status: DC

## 2019-08-13 MED FILL — CITALOPRAM HBR 20 MG TABLET: 20 | 90 days supply | Qty: 90 | Fill #0

## 2019-08-13 MED FILL — GABAPENTIN 300 MG CAPSULE: 300 | 90 days supply | Qty: 180 | Fill #0

## 2019-08-13 MED FILL — HYDROCHLOROTHIAZIDE 12.5 MG: 12.5 | 30 days supply | Qty: 30 | Fill #0

## 2019-08-13 MED FILL — VASCEPA 1 GM CAPSULE: 1 | 90 days supply | Qty: 180 | Fill #1

## 2019-08-14 ENCOUNTER — Other Ambulatory Visit: Payer: Self-pay | Admitting: Osteopathic Medicine

## 2019-09-06 MED FILL — HYDROCHLOROTHIAZIDE 12.5 MG: 12.5 | 90 days supply | Qty: 90 | Fill #0

## 2019-11-20 MED FILL — CITALOPRAM HBR 20 MG TABLET: 20 | 90 days supply | Qty: 90 | Fill #1

## 2019-11-20 MED FILL — GABAPENTIN 300 MG CAPSULE: 300 | 90 days supply | Qty: 180 | Fill #1

## 2019-11-20 MED FILL — VASCEPA 1 GM CAPSULE: 1 | 90 days supply | Qty: 180 | Fill #2

## 2019-11-22 ENCOUNTER — Encounter: Payer: Self-pay | Admitting: Osteopathic Medicine

## 2019-12-05 ENCOUNTER — Ambulatory Visit: Payer: 59 | Admitting: Osteopathic Medicine

## 2019-12-14 ENCOUNTER — Encounter: Payer: Self-pay | Admitting: Osteopathic Medicine

## 2019-12-17 NOTE — Telephone Encounter (Signed)
Forwarding to provider. Pt was informed she had to reschedule her appointment since she was late. Medical assistant was not made aware that pt had called prior to coming for their appointment.

## 2020-01-11 MED FILL — HYDROCHLOROTHIAZIDE 12.5 MG: 12.5 | 90 days supply | Qty: 90 | Fill #1

## 2020-02-04 IMAGING — US US ABDOMEN COMPLETE
1 series · 14 of 25 positions shown · non-contrast
Comparison: None.

CLINICAL DATA: Epigastric pain, diabetes, hypertension

EXAM:
ABDOMEN ULTRASOUND COMPLETE

[Series 1: us abdomen complete · 0.18mm/px · 14 of 66 slices shown]
[im 1/66]
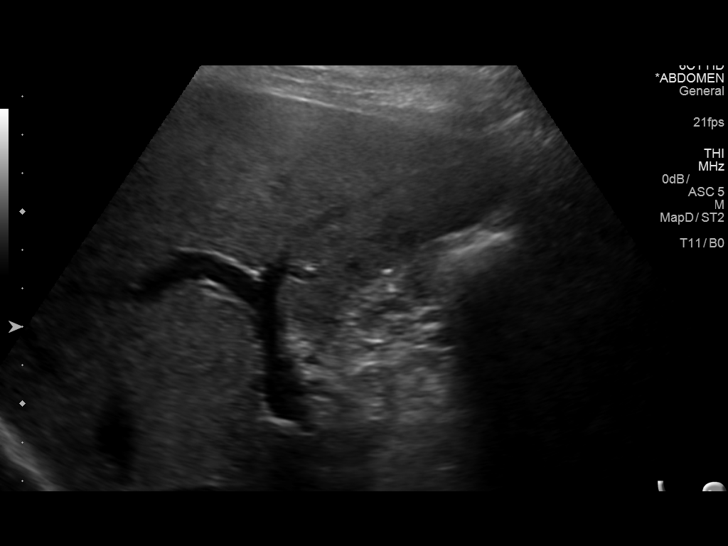
[im 6/66]
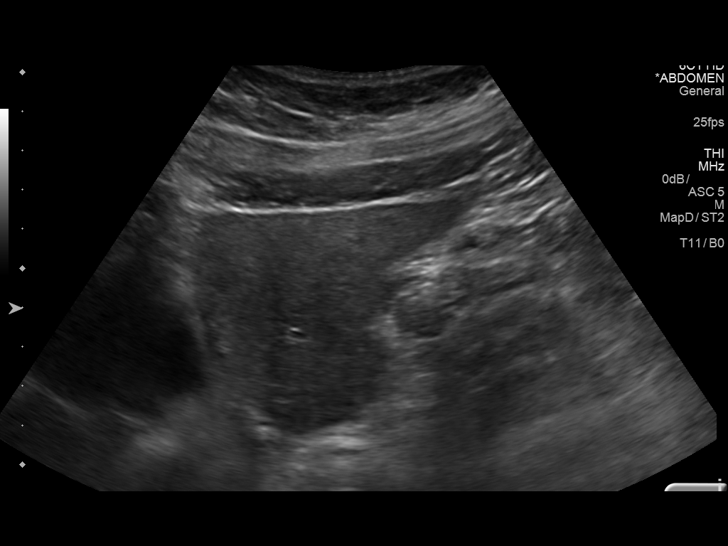
[im 11/66]
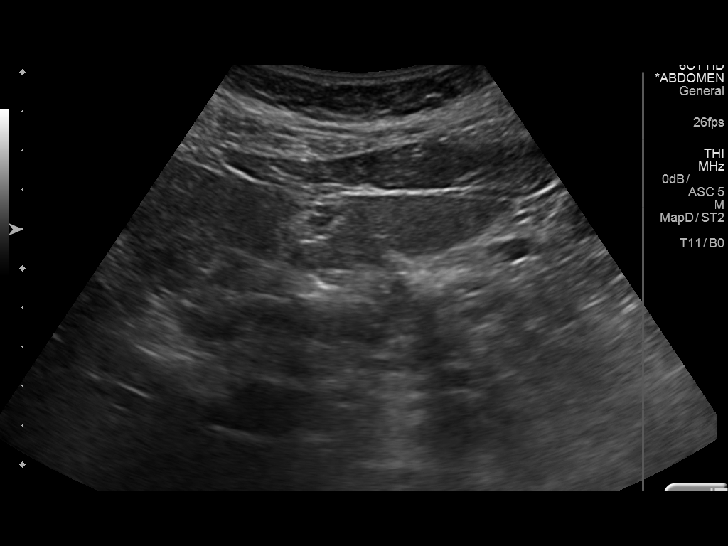
[im 17/66]
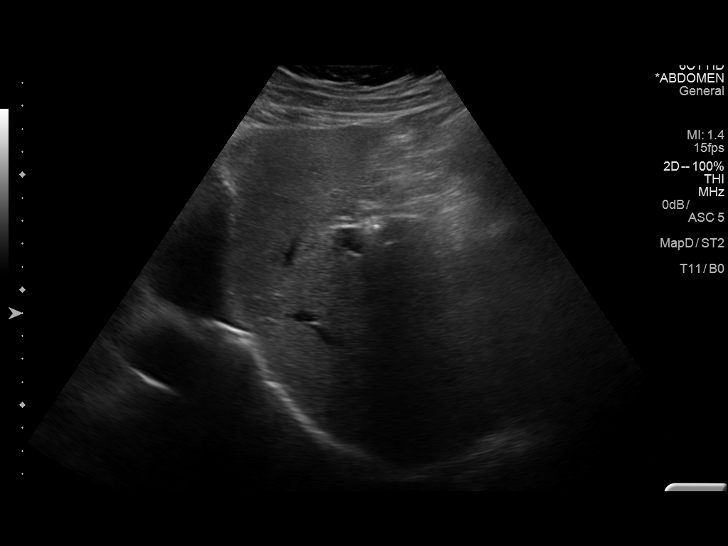
[im 22/66]
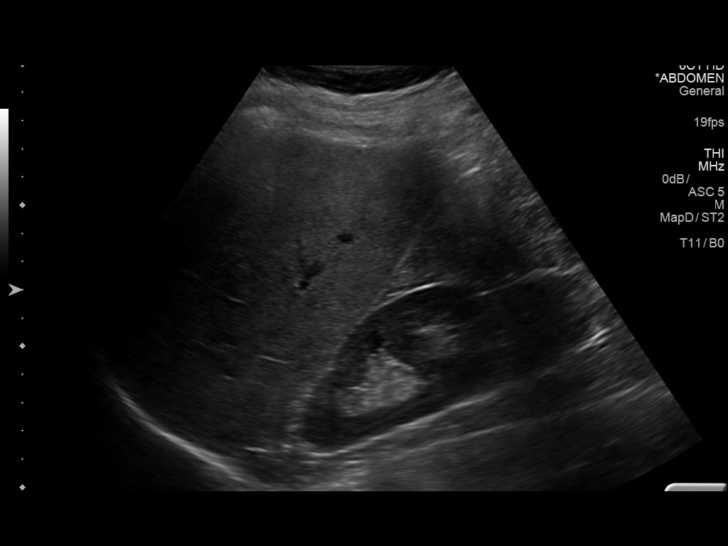
[im 25/66]
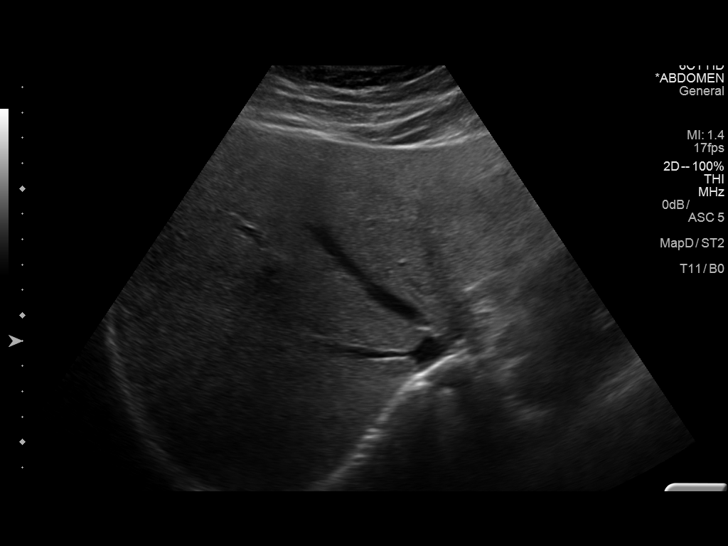
[im 30/66]
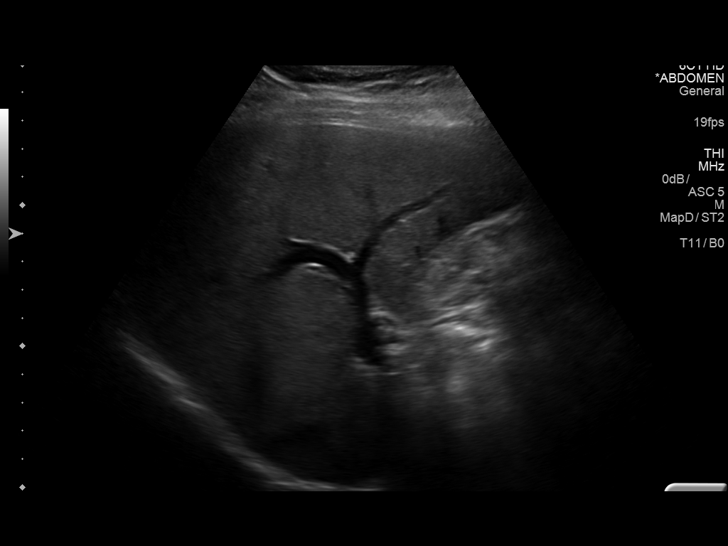
[im 36/66]
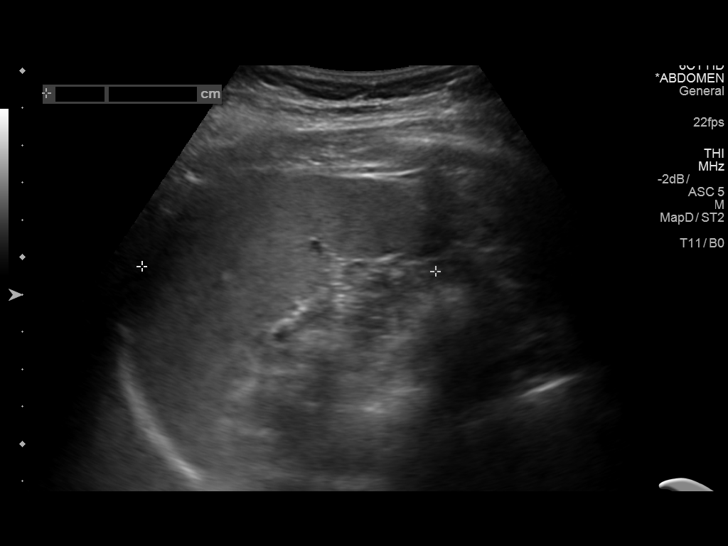
[im 41/66]
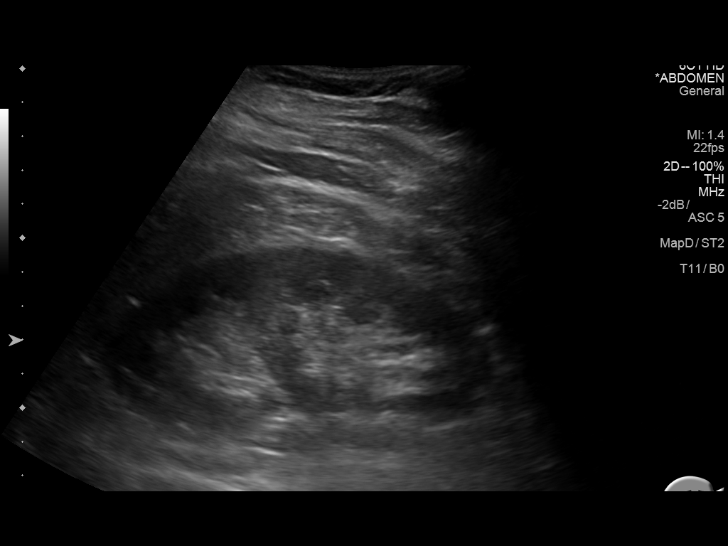
[im 44/66]
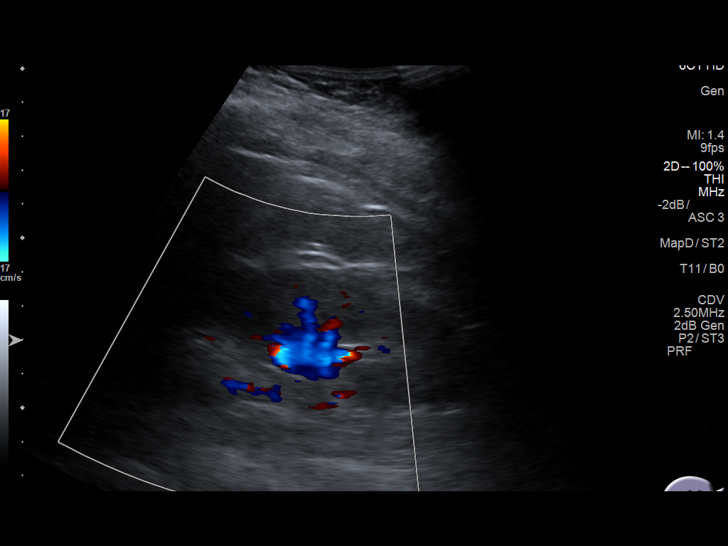
[im 49/66]
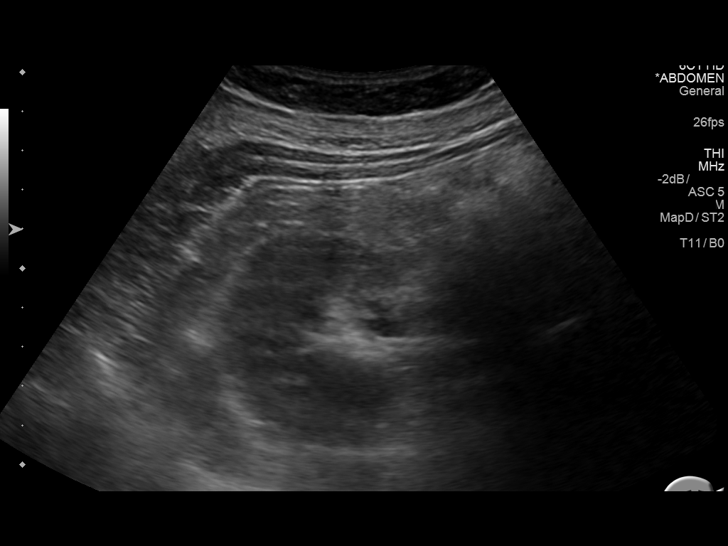
[im 55/66]
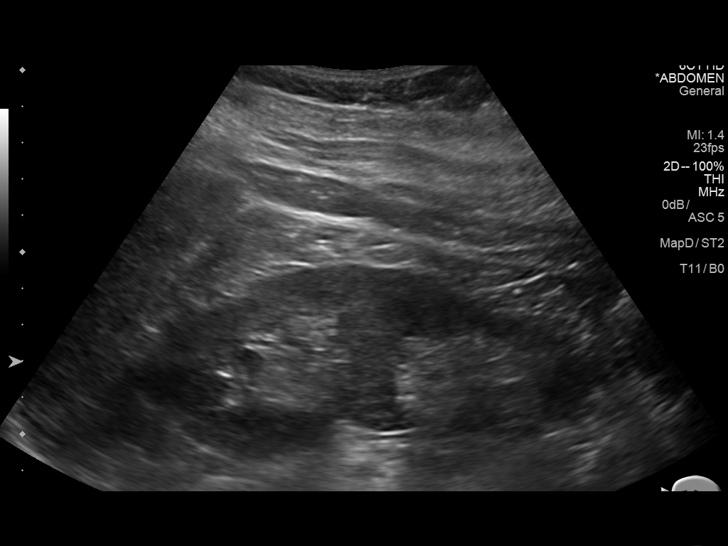
[im 60/66]
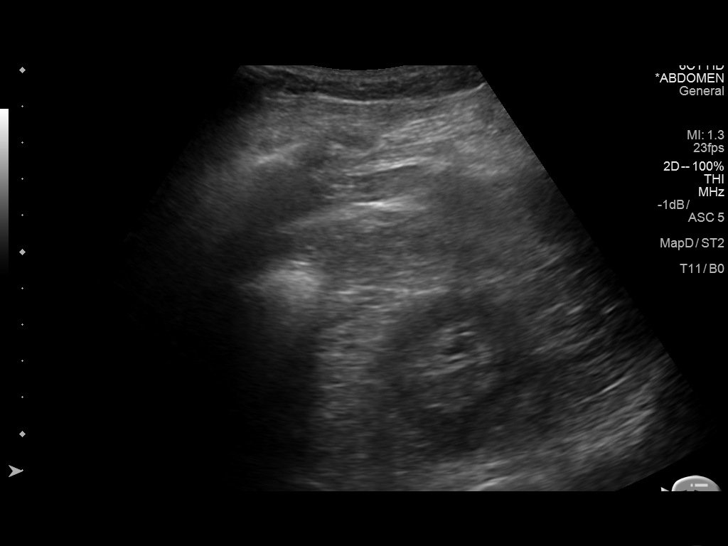
[im 66/66]
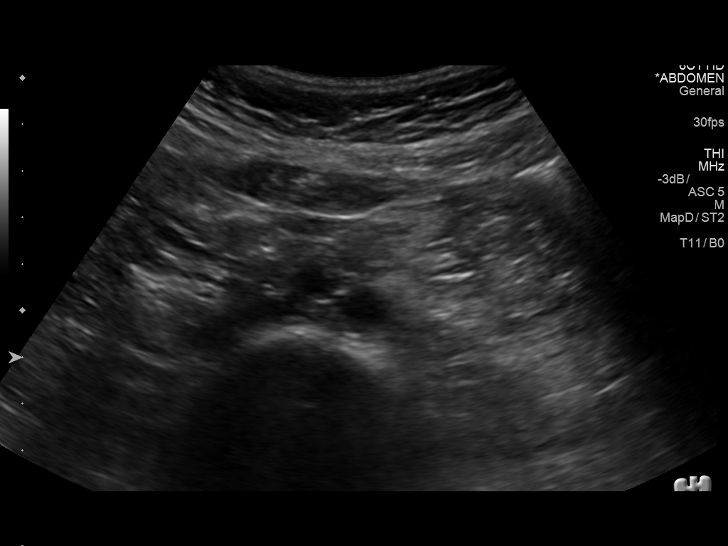

[14 of 25 positions shown; findings below may reference images not displayed]

FINDINGS: Gallbladder: The gallbladder has previously been resected.

Common bile duct: Diameter: The common bile duct measures 2.4 mm.

Liver: The liver somewhat echogenic consistent with diffuse fatty
infiltration. No focal hepatic abnormality is noted. Portal vein is
patent on color Doppler imaging with normal direction of blood flow
towards the liver.

IVC: No abnormality visualized.

Pancreas: The mid body the pancreas appears normal. Portions of both
the head and tail of the pancreas are obscured by bowel gas.

Spleen: 7.9 cm.

Right Kidney: Length: 11.7 cm..  No hydronephrosis is seen.

Left Kidney: Length: 12.5 cm..  No hydronephrosis is noted.

Abdominal aorta: The abdominal aorta is not well seen due to bowel
gas.

Other findings: Some of the anatomy is obscured by overlying bowel
gas.
IMPRESSION: 1. Somewhat echogenic liver parenchyma consistent with fatty
infiltration. No focal hepatic abnormality.
2. The tail of the pancreas is obscured by bowel gas.

## 2020-03-04 ENCOUNTER — Other Ambulatory Visit: Payer: Self-pay

## 2020-03-04 ENCOUNTER — Emergency Department (HOSPITAL_BASED_OUTPATIENT_CLINIC_OR_DEPARTMENT_OTHER)
Admission: EM | Admit: 2020-03-04 | Discharge: 2020-03-04 | Disposition: A | Discharge status: Home / Self Care | Payer: 59 | Attending: Emergency Medicine | Admitting: Emergency Medicine

## 2020-03-04 ENCOUNTER — Telehealth: Payer: Self-pay

## 2020-03-04 ENCOUNTER — Emergency Department (HOSPITAL_BASED_OUTPATIENT_CLINIC_OR_DEPARTMENT_OTHER): Payer: 59

## 2020-03-04 ENCOUNTER — Encounter (HOSPITAL_BASED_OUTPATIENT_CLINIC_OR_DEPARTMENT_OTHER): Payer: Self-pay | Admitting: *Deleted

## 2020-03-04 DIAGNOSIS — R0602 Shortness of breath: Secondary | ICD-10-CM | POA: Diagnosis not present

## 2020-03-04 DIAGNOSIS — R072 Precordial pain: Secondary | ICD-10-CM | POA: Diagnosis not present

## 2020-03-04 DIAGNOSIS — I10 Essential (primary) hypertension: Secondary | ICD-10-CM | POA: Diagnosis not present

## 2020-03-04 DIAGNOSIS — R079 Chest pain, unspecified: Secondary | ICD-10-CM | POA: Diagnosis not present

## 2020-03-04 DIAGNOSIS — R42 Dizziness and giddiness: Secondary | ICD-10-CM | POA: Diagnosis not present

## 2020-03-04 LAB — CBC WITH DIFFERENTIAL/PLATELET
Abs Immature Granulocytes: 0.03 10*3/uL (ref 0.00–0.07)
Basophils Absolute: 0 10*3/uL (ref 0.0–0.1)
Basophils Relative: 0 %
Eosinophils Absolute: 0.2 10*3/uL (ref 0.0–0.5)
Eosinophils Relative: 3 %
HCT: 35.7 % — ABNORMAL LOW (ref 36.0–46.0)
Hemoglobin: 11.7 g/dL — ABNORMAL LOW (ref 12.0–15.0)
Immature Granulocytes: 1 %
Lymphocytes Relative: 42 %
Lymphs Abs: 2.6 10*3/uL (ref 0.7–4.0)
MCH: 29.5 pg (ref 26.0–34.0)
MCHC: 32.8 g/dL (ref 30.0–36.0)
MCV: 90.2 fL (ref 80.0–100.0)
Monocytes Absolute: 0.6 10*3/uL (ref 0.1–1.0)
Monocytes Relative: 9 %
Neutro Abs: 2.8 10*3/uL (ref 1.7–7.7)
Neutrophils Relative %: 45 %
Platelets: 279 10*3/uL (ref 150–400)
RBC: 3.96 MIL/uL (ref 3.87–5.11)
RDW: 13.7 % (ref 11.5–15.5)
WBC: 6.1 10*3/uL (ref 4.0–10.5)
nRBC: 0 % (ref 0.0–0.2)

## 2020-03-04 LAB — COMPREHENSIVE METABOLIC PANEL
ALT: 22 U/L (ref 0–44)
AST: 23 U/L (ref 15–41)
Albumin: 4.1 g/dL (ref 3.5–5.0)
Alkaline Phosphatase: 54 U/L (ref 38–126)
Anion gap: 10 (ref 5–15)
BUN: 12 mg/dL (ref 6–20)
CO2: 28 mmol/L (ref 22–32)
Calcium: 9.3 mg/dL (ref 8.9–10.3)
Chloride: 103 mmol/L (ref 98–111)
Creatinine, Ser: 1.04 mg/dL — ABNORMAL HIGH (ref 0.44–1.00)
GFR calc Af Amer: >60 mL/min (ref >60)
GFR calc non Af Amer: >60 mL/min (ref >60)
Glucose, Bld: 93 mg/dL (ref 70–99)
Potassium: 3.4 mmol/L — ABNORMAL LOW (ref 3.5–5.1)
Sodium: 141 mmol/L (ref 135–145)
Total Bilirubin: 0.4 mg/dL (ref 0.3–1.2)
Total Protein: 7.3 g/dL (ref 6.5–8.1)

## 2020-03-04 LAB — TROPONIN I (HIGH SENSITIVITY)
Troponin I (High Sensitivity): 5 ng/L (ref <18)
Troponin I (High Sensitivity): 6 ng/L (ref <18)

## 2020-03-04 LAB — BRAIN NATRIURETIC PEPTIDE: B Natriuretic Peptide: 33.4 pg/mL (ref 0.0–100.0)

## 2020-03-04 LAB — D-DIMER, QUANTITATIVE: D-Dimer, Quant: <0.27 ug/mL-FEU (ref 0.00–0.50)

## 2020-03-04 IMAGING — DX DG SI JOINTS 3+V
3 series · 3 of 3 positions shown · non-contrast
Comparison: None.

CLINICAL DATA: Chronic SI joint pain for several months. Initial
encounter.

EXAM:
BILATERAL SACROILIAC JOINTS - 3+ VIEW

[si joints ap]
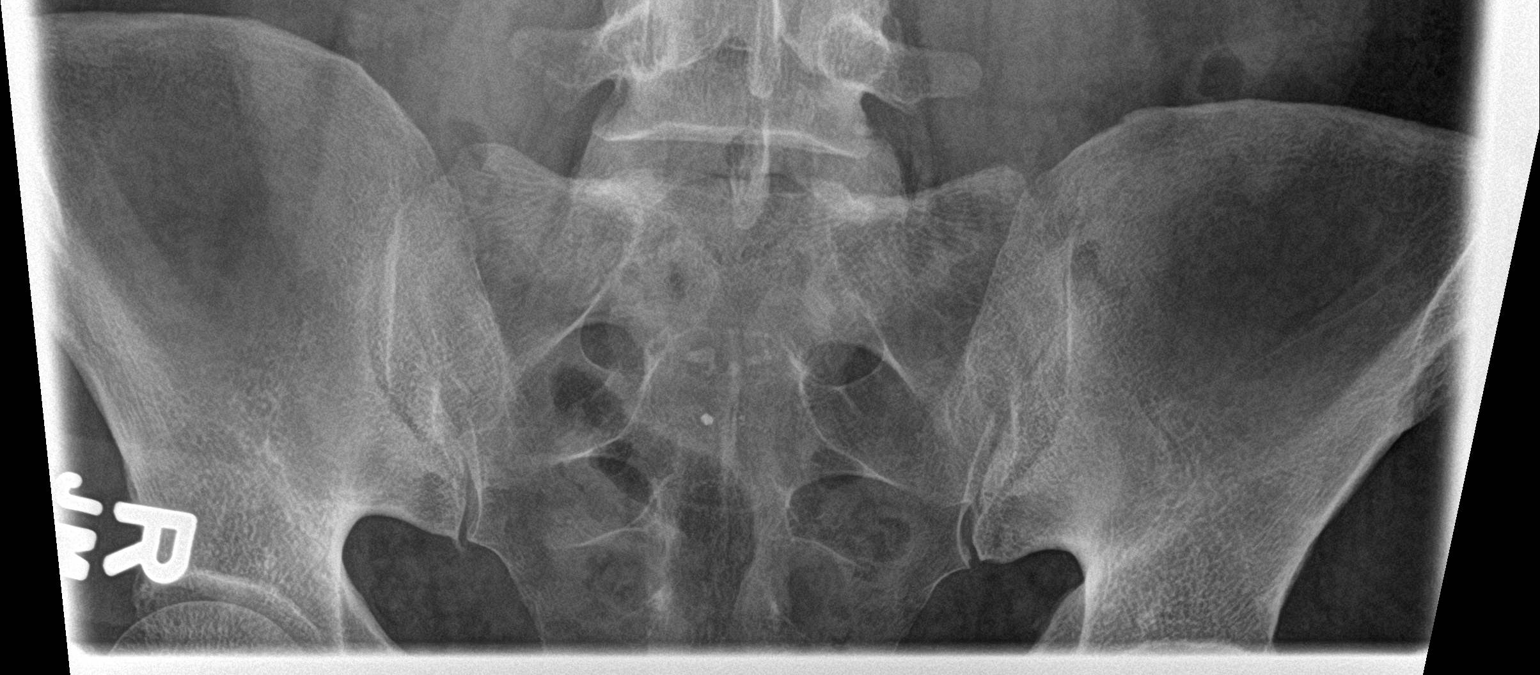

[si joints obl (1 of 2)]
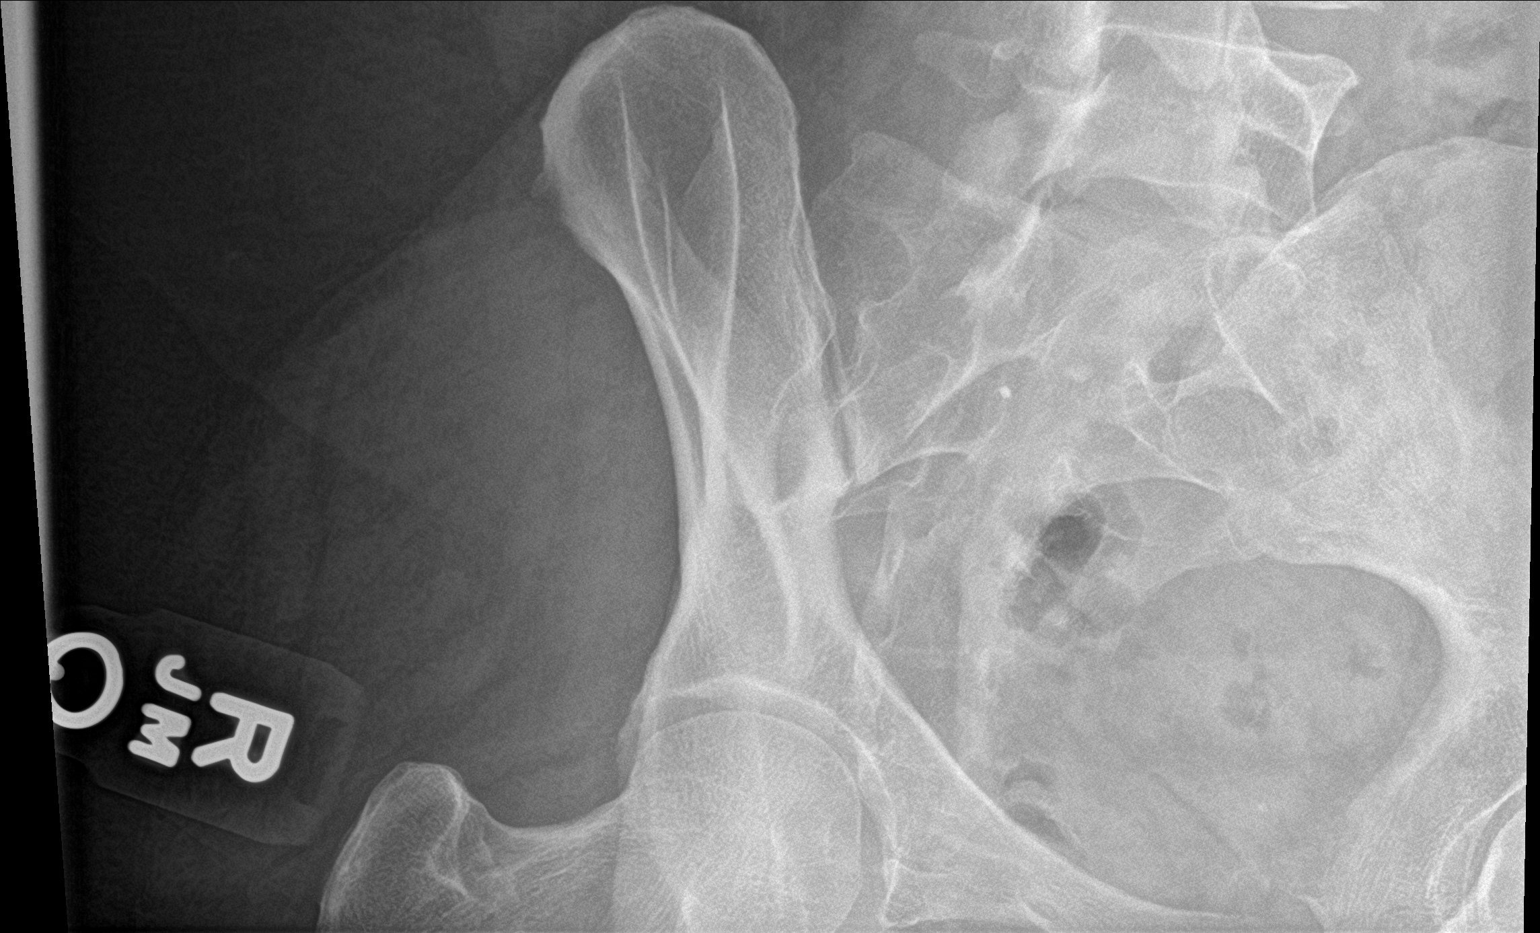

[si joints obl (2 of 2)]
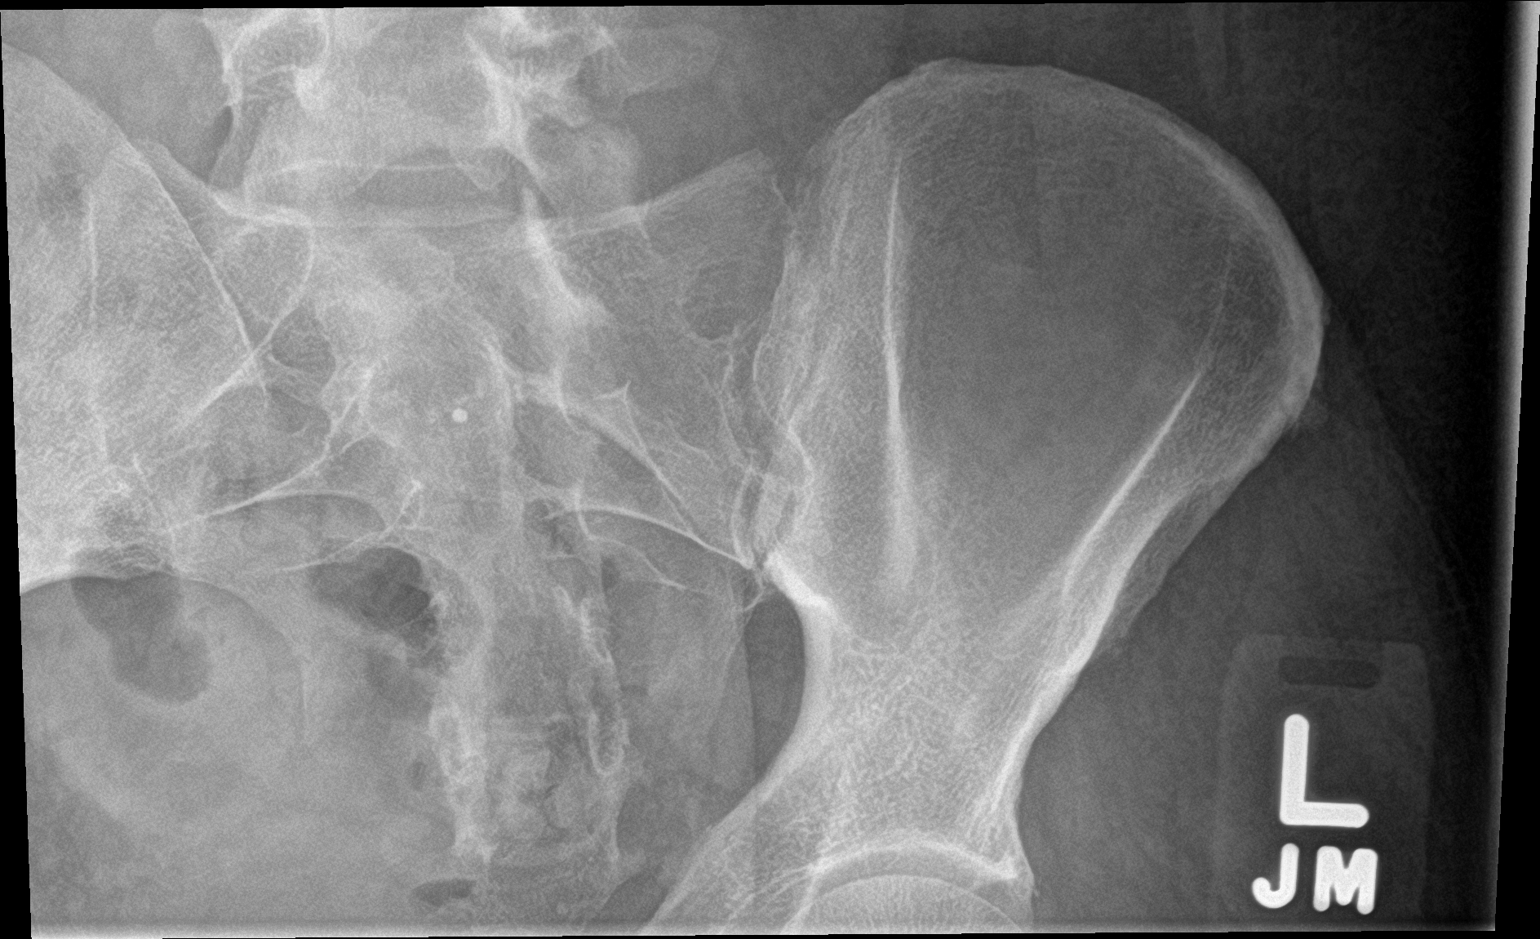

[3 of 3 positions shown; findings below may reference images not displayed]

FINDINGS: The sacroiliac joint spaces are maintained and there is no evidence
of arthropathy. No other bone abnormalities are seen.
IMPRESSION: Negative.

## 2020-03-04 MED ORDER — ALUM & MAG HYDROXIDE-SIMETH 400-400-40 MG/5ML PO SUSP: 10.0000 mL | Freq: Four times a day (QID) | ORAL | 0 refills | Status: DC | PRN

## 2020-03-04 MED ORDER — PANTOPRAZOLE SODIUM 40 MG PO TBEC
40.0000 mg | DELAYED_RELEASE_TABLET | Freq: Every day | ORAL | 0 refills | Status: DC
Start: 2020-03-04 — End: 2021-01-23

## 2020-03-04 NOTE — ED Provider Notes (Signed)
Emergency Department Provider Note   I have reviewed the triage vital signs and the nursing notes.   HISTORY  Chief Complaint Chest Pain   HPI Kristine Stevens is a 54 y.o. female with past medical history reviewed below presents to the emergency department with dizziness with shortness of breath and chest heaviness over the past 5 days.  Her shortness of breath symptoms are mainly with exertion.  She feels winded in situations where she typically would not such as walking up steps or around her house.  She feels intermittently lightheaded including today when getting out of the shower.  She checked her blood pressure shortly after and found it to be systolic of 401 but then quickly returned to systolics in the 027O which is more her normal.  She has intermittent pressure in her chest both with exertion and at rest but no clear reproducible factors.  The pain is left of center and radiates to the shoulder blades.  She denies nausea or diaphoresis.  No syncope.  No leg swelling or unilateral pain.  No recent surgeries, medication changes, [place] trips.  No history of PE or ACS.  She called her primary care doctor to discuss the symptoms and was redirected to the emergency department by the triage nurse on the phone.  She did have COVID-19 earlier this year and after full recovery was subsequently vaccinated.    Past Medical History:  Diagnosis Date  . GERD (gastroesophageal reflux disease)   . Heartburn   . Hyperlipidemia   . Hypertension   . IBS (irritable bowel syndrome)   . PONV (postoperative nausea and vomiting)    severe  . Prediabetes   . Sphincter of Oddi dysfunction     Patient Active Problem List   Diagnosis Date Noted  . Statin intolerance 04/12/2019  . Sicca syndrome, unspecified ([place]) 02/05/2019  . Elevated transaminase measurement 11/02/2017  . Right foot pain 01/27/2017  . History of high cholesterol 05/27/2016  . History of gestational diabetes 05/27/2016  . History  of gestational hypertension 05/27/2016  . History of acute pancreatitis 05/27/2016  . Acid reflux 04/17/2015  . Prediabetes 04/17/2015  . 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 Hyperlipemia 01/02/2015  . Dysfunction of sphincter of Oddi 08/16/2013    Past Surgical History:  Procedure Laterality Date  . ABDOMINOPLASTY  05/25/2012  . [place]   x2  . CHOLECYSTECTOMY  1995  . IRRIGATION AND DEBRIDEMENT ABSCESS Right 05/22/2013   Procedure: MINOR INCISION AND DRAINAGE OF ABSCESS;  Surgeon: Wynonia Sours, MD;  Location: [facility];  Service: Orthopedics;  Laterality: Right;  . KNEE ARTHROSCOPY Left    x3  . LAPAROSCOPIC GASTRIC SLEEVE RESECTION N/A 03/25/2015   Procedure: LAPAROSCOPIC GASTRIC SLEEVE RESECTION UPPER ENDOSCOPY;  Surgeon: Johnathan Hausen, MD;  Location: [facility];  Service: General;  Laterality: N/A;  . [place], 2001   x2    Allergies Patient has no known allergies.  Family History  Problem Relation Age of Onset  . Hypertension Mother   . Hyperlipidemia Mother   . Fibromyalgia Mother   . Hypertension Father   . Breast cancer Maternal Grandmother 21  . Breast cancer Cousin 52  . Colon cancer Neg Hx   . Esophageal cancer Neg Hx   . Rectal cancer Neg Hx   . Stomach cancer Neg Hx     Social History Social History   Tobacco Use  .  Smoking status: Never Smoker  . Smokeless tobacco: Never Used  Substance Use Topics  . Alcohol use: Yes    Comment: rare  . Drug use: No    Review of Systems  Constitutional: No fever/chills. Positive intermittent lightheadedness.  Eyes: No visual changes. ENT: No sore throat. Cardiovascular: Positive chest pain. Respiratory: Positive shortness of breath. Gastrointestinal: No abdominal pain.  No nausea, no vomiting.  No diarrhea.  No constipation. Genitourinary: Negative for dysuria. Musculoskeletal: Negative for back pain. Skin:  Negative for rash. Neurological: Negative for headaches, focal weakness or numbness.  10-point ROS otherwise negative.  ____________________________________________   PHYSICAL EXAM:  VITAL SIGNS: ED Triage Vitals  Enc Vitals Group     BP 03/04/20 1541 (!) 144/84     Pulse Rate 03/04/20 1541 81     Resp 03/04/20 1541 18     Temp 03/04/20 1541 98.2 F (36.8 C)     Temp Source 03/04/20 1541 Oral     SpO2 03/04/20 1541 99 %     Weight 03/04/20 1541 170 lb (77.1 kg)   Constitutional: Alert and oriented. Well appearing and in no acute distress. Eyes: Conjunctivae are normal.  Head: Atraumatic. Nose: No congestion/rhinnorhea. Mouth/Throat: Mucous membranes are moist.  Neck: No stridor. Cardiovascular: Normal rate, regular rhythm. Good peripheral circulation. Grossly normal heart sounds.   Respiratory: Normal respiratory effort.  No retractions. Lungs CTAB. Gastrointestinal: Soft and nontender. No distention.  Musculoskeletal: No lower extremity tenderness nor edema. No gross deformities of extremities. Neurologic:  Normal speech and language.  Skin:  Skin is warm, dry and intact. No rash noted.  ____________________________________________   LABS (all labs ordered are listed, but only abnormal results are displayed)  Labs Reviewed  COMPREHENSIVE METABOLIC PANEL - Abnormal; Notable for the following components:      Result Value   Potassium 3.4 (*)    Creatinine, Ser 1.04 (*)    All other components within normal limits  CBC WITH DIFFERENTIAL/PLATELET - Abnormal; Notable for the following components:   Hemoglobin 11.7 (*)    HCT 35.7 (*)    All other components within normal limits  D-DIMER, QUANTITATIVE (NOT AT [facility])  BRAIN NATRIURETIC PEPTIDE  TROPONIN I (HIGH SENSITIVITY)  TROPONIN I (HIGH SENSITIVITY)   ____________________________________________  EKG   EKG Interpretation  Date/Time:  Tuesday March 04 2020 15:38:24 EDT Ventricular Rate:  74 PR  Interval:  162 QRS Duration: 86 QT Interval:  408 QTC Calculation: 452 R Axis:   51 Text Interpretation: Normal sinus rhythm Normal ECG No STEMI Confirmed by Alona Bene [id number]) on 03/04/2020 3:56:32 PM       ____________________________________________  RADIOLOGY  DG Chest Portable 1 View  Result Date: 03/04/2020 CLINICAL DATA:  Left-sided chest pain and shortness of breath, dizziness for 5 days EXAM: PORTABLE CHEST 1 VIEW COMPARISON:  12/25/2018 FINDINGS: The heart size and mediastinal contours are within normal limits. Both lungs are clear. The visualized skeletal structures are unremarkable. IMPRESSION: No active disease. Electronically Signed   By: Sharlet Salina M.D.   On: 03/04/2020 16:36    ____________________________________________   PROCEDURES  Procedure(s) performed:   Procedures  None  ____________________________________________   INITIAL IMPRESSION / ASSESSMENT AND PLAN / ED COURSE  Pertinent labs & imaging results that were available during my care of the patient were reviewed by me and considered in my medical decision making (see chart for details).   Patient presents to the emergency department with chest heaviness intermittently over the past 5 days  with exertional shortness of breath and occasional lightheadedness.  She is afebrile here with otherwise normal vitals.  Her EKG is unremarkable with no acute or remote ischemic change.  She does have several risk factors for ACS.  No significant risk for PE.  Unable to apply PERC rule in this setting given the patient's age.  I have added a D-dimer given constellation of symptoms. HEART score 3.   Delta troponin negative. Negative d-dimer. CXR interpreted with no acute findings. Normal BNP. History has some concerning features. EKG interpreted above without acute ischemic change. Will refer to Cardiology for follow up and further risk stratification. Discussed strict return precautions is symptoms worsen/return.  Will start on Protonix in case of a GI source but made it clear that I advised further outpatient cardiology follow up. Patient verbalizes understanding.  ____________________________________________  FINAL CLINICAL IMPRESSION(S) / ED DIAGNOSES  Final diagnoses:  Precordial chest pain     NEW OUTPATIENT MEDICATIONS STARTED DURING THIS VISIT:  Discharge Medication List as of 03/04/2020  7:23 PM    START taking these medications   Details  alum & mag hydroxide-simeth (MAALOX MAX) 400-400-40 MG/5ML suspension Take 10 mLs by mouth every 6 (six) hours as needed for indigestion., Starting Tue 03/04/2020, Normal    pantoprazole (PROTONIX) 40 MG tablet Take 1 tablet (40 mg total) by mouth daily., Starting Tue 03/04/2020, Until Thu 04/03/2020, Normal        Note:  This document was prepared using Dragon voice recognition software and may include unintentional dictation errors.  Alona Bene, MD, [facility] Emergency Medicine    Gilman Olazabal, Arlyss Repress, MD 03/05/20 [phone]

## 2020-03-04 NOTE — Telephone Encounter (Signed)
Haddie states she has been having chest pain, dizziness and increased heart rate for a couple of days. Advised to go to ED ASAP. I advised to call EMS. I also offered to call EMS. She states she would go to the ED.

## 2020-03-04 NOTE — ED Triage Notes (Signed)
Pt c/o left sided chest pain/ SOb and dizziness  x 5 days

## 2020-03-04 NOTE — Discharge Instructions (Signed)
You were seen in the emergency department today with chest pain.  Your labs and imaging here do not show evidence of heart attack or blood clot in the lungs.  Given your history, however, I would like for you to follow closely with the cardiology team for further evaluation and possible stress test.  I have placed a referral in our system and listed their contact information here on this paperwork.  Please call tomorrow to confirm your appointment.  I have called in 2 prescriptions for esophageal reflux type symptoms.  Please return to the emergency department immediately if you develop sudden worsening pain, shortness of breath, other severe symptoms.

## 2020-03-05 ENCOUNTER — Other Ambulatory Visit: Payer: Self-pay | Admitting: Osteopathic Medicine

## 2020-03-06 ENCOUNTER — Other Ambulatory Visit: Payer: Self-pay | Admitting: Osteopathic Medicine

## 2020-03-07 NOTE — Telephone Encounter (Signed)
Kristine Stevens has been scheduled for follow up appointment.

## 2020-03-14 MED FILL — VASCEPA 1 GM CAPSULE: 1 | 90 days supply | Qty: 180 | Fill #3

## 2020-03-14 MED FILL — CITALOPRAM HBR 20 MG TABLET: 20 | 30 days supply | Qty: 30 | Fill #0

## 2020-03-15 NOTE — Progress Notes (Signed)
Referring-Kristine Alexander DO Reason for referral-Chest pain  HPI: 54 yo female for evaluation of chest pain at request of [place]. Seen in ER 6/21 with CP; troponin and chest xray normal; hgb 11.7; LFTs normal. Ddimer normal; BNP normal.  Patient states the past she has had occasional flutters but more recently she feels her heart pounding.  This can occur at any time and typically lasts 10 minutes and resolves.  She states her blood pressure will be low and then increases and her heart rate improves.  She also describes occasional chest pain that is in the left breast area radiating to her scapula.  No associated symptoms.  Not exertional or pleuritic.  Last several minutes and resolves.  She also notes dyspnea on exertion and orthopnea.  Because of the above we were asked to evaluate.  No syncope.  Current Outpatient Medications  Medication Sig Dispense Refill  . alum & mag hydroxide-simeth (MAALOX MAX) 400-400-40 MG/5ML suspension Take 10 mLs by mouth every 6 (six) hours as needed for indigestion. 355 mL 0  . aspirin 81 MG tablet Take 81 mg by mouth daily.    . citalopram (CELEXA) 20 MG tablet TAKE 1 TABLET (20 MG TOTAL) BY MOUTH DAILY. 30 tablet 0  . gabapentin (NEURONTIN) 300 MG capsule Take 1 capsule (300 mg total) by mouth 2 (two) times daily as needed. 60 capsule 5  . hydrochlorothiazide (HYDRODIURIL) 12.5 MG tablet Take 1 tablet (12.5 mg total) by mouth daily. 90 tablet 1  . Icosapent Ethyl 1 g CAPS Take 1 capsule (1 g total) by mouth 2 (two) times daily. 180 capsule 3  . Multiple Vitamins-Minerals (MULTIVITAMIN ADULT PO) Take by mouth daily.    . Omega-3 Krill Oil 500 MG CAPS Take 1,200 mg by mouth daily.    . pantoprazole (PROTONIX) 40 MG tablet Take 1 tablet (40 mg total) by mouth daily. 30 tablet 0   No current facility-administered medications for this visit.    No Known Allergies  Past Medical History:  Diagnosis Date  . GERD (gastroesophageal reflux  disease)   . Heartburn   . Hyperlipidemia   . Hypertension   . IBS (irritable bowel syndrome)   . PONV (postoperative nausea and vomiting)    severe  . Prediabetes   . Sphincter of Oddi dysfunction     Past Surgical History:  Procedure Laterality Date  . ABDOMINOPLASTY  05/25/2012  . [place]   x2  . CHOLECYSTECTOMY  1995  . IRRIGATION AND DEBRIDEMENT ABSCESS Right 05/22/2013   Procedure: MINOR INCISION AND DRAINAGE OF ABSCESS;  Surgeon: Wynonia Sours, MD;  Location: [facility];  Service: Orthopedics;  Laterality: Right;  . KNEE ARTHROSCOPY Left    x3  . LAPAROSCOPIC GASTRIC SLEEVE RESECTION N/A 03/25/2015   Procedure: LAPAROSCOPIC GASTRIC SLEEVE RESECTION UPPER ENDOSCOPY;  Surgeon: Johnathan Hausen, MD;  Location: [facility];  Service: General;  Laterality: N/A;  . [place], 2001   x2    Social History   Socioeconomic History  . Marital status: Married    Spouse name: Not on file  . Number of children: Not on file  . Years of education: Not on file  . Highest education level: Not on file  Occupational History  . Not on file  Tobacco Use  . Smoking status: Never Smoker  . Smokeless tobacco: Never Used  Substance and Sexual Activity  . Alcohol use: Yes  Comment: rare  . Drug use: No  . Sexual activity: Not on file  Other Topics Concern  . Not on file  Social History Narrative  . Not on file   Social Determinants of Health   Financial Resource Strain:   . Difficulty of Paying Living Expenses:   Food Insecurity:   . Worried About Programme researcher, broadcasting/film/video in the Last Year:   . Barista in the Last Year:   Transportation Needs:   . Freight forwarder (Medical):   Marland Kitchen Lack of Transportation (Non-Medical):   Physical Activity:   . Days of Exercise per Week:   . Minutes of Exercise per Session:   Stress:   . Feeling of Stress :   Social Connections:   . Frequency of Communication with Friends  and Family:   . Frequency of Social Gatherings with Friends and Family:   . Attends Religious Services:   . Active Member of Clubs or Organizations:   . Attends Banker Meetings:   Marland Kitchen Marital Status:   Intimate Partner Violence:   . Fear of Current or Ex-Partner:   . Emotionally Abused:   Marland Kitchen Physically Abused:   . Sexually Abused:     Family History  Problem Relation Age of Onset  . Hypertension Mother   . Hyperlipidemia Mother   . Fibromyalgia Mother   . Hypertension Father   . Breast cancer Maternal Grandmother 24  . Breast cancer Cousin 45  . Colon cancer Neg Hx   . Esophageal cancer Neg Hx   . Rectal cancer Neg Hx   . Stomach cancer Neg Hx     ROS: no fevers or chills, productive cough, hemoptysis, dysphasia, odynophagia, melena, hematochezia, dysuria, hematuria, rash, seizure activity, orthopnea, PND, pedal edema, claudication. Remaining systems are negative.  Physical Exam:   There were no vitals taken for this visit.  General:  Well developed/well nourished in NAD Skin warm/dry Patient not depressed No peripheral clubbing Back-normal HEENT-normal/normal eyelids Neck supple/normal carotid upstroke bilaterally; no bruits; no JVD; no thyromegaly chest - CTA/ normal expansion CV - RRR/normal S1 and S2; no murmurs, rubs or gallops;  PMI nondisplaced Abdomen -NT/ND, no HSM, no mass, + bowel sounds, no bruit 2+ femoral pulses, no bruits Ext-no edema, chords, 2+ DP Neuro-grossly nonfocal  ECG - 03/04/20-NSR; no ST changes; personally reviewed  A/P  1 CP-symptoms are atypical.  We will arrange a cardiac CTA to more fully evaluate.  2 hypertension-BP controlled; continue present meds and follow.  3 Hyperlipidemia-Per primary care.  4 palpitations-we will arrange a 3-day monitor to further assess.  5 dyspnea-we will arrange an echocardiogram to assess LV function.  Olga Millers, MD

## 2020-03-19 ENCOUNTER — Other Ambulatory Visit: Payer: Self-pay

## 2020-03-19 ENCOUNTER — Ambulatory Visit (INDEPENDENT_AMBULATORY_CARE_PROVIDER_SITE_OTHER): Payer: 59 | Admitting: Cardiology

## 2020-03-19 ENCOUNTER — Encounter: Payer: Self-pay | Admitting: Cardiology

## 2020-03-19 ENCOUNTER — Telehealth: Payer: Self-pay | Admitting: Radiology

## 2020-03-19 VITALS — BP 120/82 | HR 72 | Ht 60.0 in | Wt 184.0 lb

## 2020-03-19 DIAGNOSIS — R072 Precordial pain: Secondary | ICD-10-CM

## 2020-03-19 DIAGNOSIS — R002 Palpitations: Secondary | ICD-10-CM | POA: Diagnosis not present

## 2020-03-19 DIAGNOSIS — I10 Essential (primary) hypertension: Secondary | ICD-10-CM

## 2020-03-19 DIAGNOSIS — R079 Chest pain, unspecified: Secondary | ICD-10-CM | POA: Diagnosis not present

## 2020-03-19 MED ORDER — METOPROLOL TARTRATE 100 MG PO TABS
ORAL_TABLET | ORAL | 0 refills | Status: DC
Start: 2020-03-19 — End: 2020-05-19

## 2020-03-19 MED FILL — METOPROLOL TARTRATE 100 MG: 100 | 1 days supply | Qty: 1 | Fill #0

## 2020-03-19 NOTE — Patient Instructions (Addendum)
Medication Instructions:  NO CHANGE *If you need a refill on your cardiac medications before your next appointment, please call your pharmacy*   Lab Work: If you have labs (blood work) drawn today and your tests are completely normal, you will receive your results only by: . MyChart Message (if you have MyChart) OR . A paper copy in the mail If you have any lab test that is abnormal or we need to change your treatment, we will call you to review the results.   Testing/Procedures:  Your physician has requested that you have an echocardiogram. Echocardiography is a painless test that uses sound waves to create images of your heart. It provides your doctor with information about the size and shape of your heart and how well your heart's chambers and valves are working. This procedure takes approximately one hour. There are no restrictions for this procedure.[facility]  Your cardiac CT will be scheduled at one of the below locations:   [facility] [address] [place], [place] [address] [phone]  If scheduled at [facility], please arrive at the [facility] main entrance of [facility] 30 minutes prior to test start time. Proceed to the [facility] Department (first floor) to check-in and test prep.  Please follow these instructions carefully (unless otherwise directed):  On the Night Before the Test: . Be sure to Drink plenty of water. . Do not consume any caffeinated/decaffeinated beverages or chocolate 12 hours prior to your test. . Do not take any antihistamines 12 hours prior to your test.  On the Day of the Test: . Drink plenty of water. Do not drink any water within one hour of the test. . Do not eat any food 4 hours prior to the test. . You may take your regular medications prior to the test.  . Take metoprolol (Lopressor) 100 MG two hours prior to test. . HOLD Furosemide/Hydrochlorothiazide morning of the test. . FEMALES-  please wear underwire-free bra if available       After the Test: . Drink plenty of water. . After receiving IV contrast, you may experience a mild flushed feeling. This is normal. . On occasion, you may experience a mild rash up to 24 hours after the test. This is not dangerous. If this occurs, you can take Benadryl 25 mg and increase your fluid intake. . If you experience trouble breathing, this can be serious. If it is severe call 911 IMMEDIATELY. If it is mild, please call our office. . If you take any of these medications: Glipizide/Metformin, Avandament, Glucavance, please do not take 48 hours after completing test unless otherwise instructed.   Once we have confirmed authorization from your insurance company, we will call you to set up a date and time for your test.   For non-scheduling related questions, please contact the cardiac imaging nurse navigator should you have any questions/concerns: Sara Wallace, Cardiac Imaging Nurse Navigator Merle Tai, Interim Cardiac Imaging Nurse Navigator [facility] Direct Office Dial: [phone]   For scheduling needs, including cancellations and rescheduling, please call [phone].   ZIO XT- Long Term Monitor Instructions   Your physician has requested you wear your ZIO patch monitor____3___days.   This is a single patch monitor.  [employer] supplies one patch monitor per enrollment.  Additional stickers are not available.   Please do not apply patch if you will be having a Nuclear Stress Test, Echocardiogram, Cardiac CT, MRI, or Chest Xray during   the time frame you would be wearing the monitor. The patch cannot be worn during these tests.  You cannot remove and re-apply the ZIO XT patch monitor.   Your ZIO patch monitor will be sent USPS Priority mail from [employer] directly to your home address. The monitor may also be mailed to a PO BOX if home delivery is not available.   It may take 3-5 days to  receive your monitor after you have been enrolled.   Once you have received you monitor, please review enclosed instructions.  Your monitor has already been registered assigning a specific monitor serial # to you.   Applying the monitor   Shave hair from upper left chest.   Hold abrader disc by orange tab.  Rub abrader in 40 strokes over left upper chest as indicated in your monitor instructions.   Clean area with 4 enclosed alcohol pads .  Use all pads to assure are is cleaned thoroughly.  Let dry.   Apply patch as indicated in monitor instructions.  Patch will be place under collarbone on left side of chest with arrow pointing upward.   Rub patch adhesive wings for 2 minutes.Remove white label marked "1".  Remove white label marked "2".  Rub patch adhesive wings for 2 additional minutes.   While looking in a mirror, press and release button in center of patch.  A small green light will flash 3-4 times .  This will be your only indicator the monitor has been turned on.     Do not shower for the first 24 hours.  You may shower after the first 24 hours.   Press button if you feel a symptom. You will hear a small click.  Record Date, Time and Symptom in the Patient Log Book.   When you are ready to remove patch, follow instructions on last 2 pages of Patient Log Book.  Stick patch monitor onto last page of Patient Log Book.   Place Patient Log Book in Blue box.  Use locking tab on box and tape box closed securely.  The Orange and White box has prepaid postage on it.  Please place in mailbox as soon as possible.  Your physician should have your test results approximately 7 days after the monitor has been mailed back to [employer].   Call [employer] Customer Care at [phone] if you have questions regarding your ZIO XT patch monitor.  Call them immediately if you see an orange light blinking on your monitor.   If your monitor falls off in less than 4 days contact our Monitor  department at [phone].  If your monitor becomes loose or falls off after 4 days call [employer] at [phone] for suggestions on securing your monitor.   Follow-Up: At [facility], you and your health needs are our priority.  As part of our continuing mission to provide you with exceptional heart care, we have created designated Provider Care Teams.  These Care Teams include your primary Cardiologist (physician) and Advanced Practice Providers (APPs -  Physician Assistants and Nurse Practitioners) who all work together to provide you with the care you need, when you need it.  We recommend signing up for the patient portal called "MyChart".  Sign up information is provided on this After Visit Summary.  MyChart is used to connect with patients for Virtual Visits (Telemedicine).  Patients are able to view lab/test results, encounter notes, upcoming appointments, etc.  Non-urgent messages can be sent to your provider as well.     To learn more about what you can do with MyChart, go to [email or web address].    Your next appointment:   3 month(s)  The format for your next appointment:   In Person  Provider:   Brian Crenshaw, MD    

## 2020-03-19 NOTE — Telephone Encounter (Signed)
Enrolled patient for a 3 day Zio monitor to be mailed to patients home.  

## 2020-03-21 ENCOUNTER — Ambulatory Visit (INDEPENDENT_AMBULATORY_CARE_PROVIDER_SITE_OTHER): Payer: 59 | Admitting: Nurse Practitioner

## 2020-03-21 ENCOUNTER — Encounter: Payer: Self-pay | Admitting: Nurse Practitioner

## 2020-03-21 VITALS — BP 123/81 | HR 77 | Temp 98.0°F | Ht 60.0 in | Wt 185.1 lb

## 2020-03-21 DIAGNOSIS — H60502 Unspecified acute noninfective otitis externa, left ear: Secondary | ICD-10-CM | POA: Diagnosis not present

## 2020-03-21 DIAGNOSIS — H6690 Otitis media, unspecified, unspecified ear: Secondary | ICD-10-CM

## 2020-03-21 DIAGNOSIS — Z0189 Encounter for other specified special examinations: Secondary | ICD-10-CM | POA: Diagnosis not present

## 2020-03-21 MED ORDER — AMOXICILLIN-POT CLAVULANATE 875-125 MG PO TABS: 1.0000 | ORAL_TABLET | Freq: Two times a day (BID) | ORAL | 0 refills | Status: AC

## 2020-03-21 MED ORDER — CIPROFLOXACIN-DEXAMETHASONE 0.3-0.1 % OT SUSP: 4.0000 [drp] | Freq: Two times a day (BID) | OTIC | 0 refills | Status: AC

## 2020-03-21 MED FILL — AMOX-CLAV 875-125 MG TABLET: 875-125 | 5 days supply | Qty: 10 | Fill #0

## 2020-03-21 MED FILL — CIPROFLOXACIN-DEXAMETHASONE: 0.3-0.1 | 18 days supply | Qty: 8 | Fill #0
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
## 2020-03-21 NOTE — Patient Instructions (Signed)
Otitis Externa  Otitis externa is an infection of the outer ear canal. The outer ear canal is the area between the outside of the ear and the eardrum. Otitis externa is sometimes called swimmer's ear. What are the causes? Common causes of this condition include:  Swimming in dirty water.  Moisture in the ear.  An injury to the inside of the ear.  An object stuck in the ear.  A cut or scrape on the outside of the ear. What increases the risk? You are more likely to develop this condition if you go swimming often. What are the signs or symptoms? The first symptom of this condition is often itching in the ear. Later symptoms of the condition include:  Swelling of the ear.  Redness in the ear.  Ear pain. The pain may get worse when you pull on your ear.  Pus coming from the ear. How is this diagnosed? This condition may be diagnosed by examining the ear and testing fluid from the ear for bacteria and funguses. How is this treated? This condition may be treated with:  Antibiotic ear drops. These are often given for 10-14 days.  Medicines to reduce itching and swelling. Follow these instructions at home:  If you were prescribed antibiotic ear drops, use them as told by your health care provider. Do not stop using the antibiotic even if your condition improves.  Take over-the-counter and prescription medicines only as told by your health care provider.  Avoid getting water in your ears as told by your health care provider. This may include avoiding swimming or water sports for a few days.  Keep all follow-up visits as told by your health care provider. This is important. How is this prevented?  Keep your ears dry. Use the corner of a towel to dry your ears after you swim or bathe.  Avoid scratching or putting things in your ear. Doing these things can damage the ear canal or remove the protective wax that lines it, which makes it easier for bacteria and funguses to  grow.  Avoid swimming in lakes, polluted water, or pools that may not have enough chlorine. Contact a health care provider if:  You have a fever.  Your ear is still red, swollen, painful, or draining pus after 3 days.  Your redness, swelling, or pain gets worse.  You have a severe headache.  You have redness, swelling, pain, or tenderness in the area behind your ear. Summary  Otitis externa is an infection of the outer ear canal.  Common causes include swimming in dirty water, moisture in the ear, or a cut or scrape in the ear.  Symptoms include pain, redness, and swelling of the ear.  If you were prescribed antibiotic ear drops, use them as told by your health care provider. Do not stop using the antibiotic even if your condition improves. This information is not intended to replace advice given to you by your health care provider. Make sure you discuss any questions you have with your health care provider. Document Revised: 02/17/2018 Document Reviewed: 02/17/2018 Elsevier Patient Education  2020 [employer]  

## 2020-03-25 DIAGNOSIS — Z0189 Encounter for other specified special examinations: Secondary | ICD-10-CM | POA: Diagnosis not present

## 2020-03-26 ENCOUNTER — Ambulatory Visit (INDEPENDENT_AMBULATORY_CARE_PROVIDER_SITE_OTHER): Payer: 59 | Admitting: Osteopathic Medicine

## 2020-03-26 ENCOUNTER — Other Ambulatory Visit: Payer: Self-pay | Admitting: Osteopathic Medicine

## 2020-03-26 ENCOUNTER — Other Ambulatory Visit: Payer: Self-pay

## 2020-03-26 VITALS — BP 142/89 | HR 73 | Wt 184.0 lb

## 2020-03-26 DIAGNOSIS — Z0000 Encounter for general adult medical examination without abnormal findings: Secondary | ICD-10-CM | POA: Diagnosis not present

## 2020-03-26 DIAGNOSIS — E782 Mixed hyperlipidemia: Secondary | ICD-10-CM

## 2020-03-26 DIAGNOSIS — H6122 Impacted cerumen, left ear: Secondary | ICD-10-CM

## 2020-03-26 DIAGNOSIS — R7303 Prediabetes: Secondary | ICD-10-CM | POA: Diagnosis not present

## 2020-03-26 LAB — CBC WITH DIFFERENTIAL/PLATELET
Absolute Monocytes: 419 cells/uL (ref 200–950)
Basophils Absolute: 18 cells/uL (ref 0–200)
Basophils Relative: 0.4 %
Eosinophils Absolute: 131 cells/uL (ref 15–500)
Eosinophils Relative: 2.9 %
HCT: 34.5 % — ABNORMAL LOW (ref 35.0–45.0)
Hemoglobin: 11.6 g/dL — ABNORMAL LOW (ref 11.7–15.5)
Lymphs Abs: 2120 cells/uL (ref 850–3900)
MCH: 28.6 pg (ref 27.0–33.0)
MCHC: 33.6 g/dL (ref 32.0–36.0)
MCV: 85.2 fL (ref 80.0–100.0)
MPV: 9.3 fL (ref 7.5–12.5)
Monocytes Relative: 9.3 %
Neutro Abs: 1814 cells/uL (ref 1500–7800)
Neutrophils Relative %: 40.3 %
Platelets: 312 10*3/uL (ref 140–400)
RBC: 4.05 10*6/uL (ref 3.80–5.10)
RDW: 12.8 % (ref 11.0–15.0)
Total Lymphocyte: 47.1 %
WBC: 4.5 10*3/uL (ref 3.8–10.8)

## 2020-03-26 LAB — COMPLETE METABOLIC PANEL WITH GFR
AG Ratio: 1.7 (calc) (ref 1.0–2.5)
ALT: 14 U/L (ref 6–29)
AST: 14 U/L (ref 10–35)
Albumin: 4.3 g/dL (ref 3.6–5.1)
Alkaline phosphatase (APISO): 56 U/L (ref 37–153)
BUN: 12 mg/dL (ref 7–25)
CO2: 31 mmol/L (ref 20–32)
Calcium: 9.3 mg/dL (ref 8.6–10.4)
Chloride: 102 mmol/L (ref 98–110)
Creat: 0.72 mg/dL (ref 0.50–1.05)
GFR, Est African American: 111 mL/min/{1.73_m2} (ref >=60)
GFR, Est Non African American: 96 mL/min/{1.73_m2} (ref >=60)
Globulin: 2.5 g/dL (calc) (ref 1.9–3.7)
Glucose, Bld: 100 mg/dL — ABNORMAL HIGH (ref 65–99)
Potassium: 3.7 mmol/L (ref 3.5–5.3)
Sodium: 139 mmol/L (ref 135–146)
Total Bilirubin: 0.4 mg/dL (ref 0.2–1.2)
Total Protein: 6.8 g/dL (ref 6.1–8.1)

## 2020-03-26 LAB — LIPID PANEL
Cholesterol: 246 mg/dL — ABNORMAL HIGH (ref <200)
HDL: 53 mg/dL (ref >=50)
LDL Cholesterol (Calc): 162 mg/dL (calc) — ABNORMAL HIGH
Non-HDL Cholesterol (Calc): 193 mg/dL (calc) — ABNORMAL HIGH (ref <130)
Total CHOL/HDL Ratio: 4.6 (calc) (ref <5.0)
Triglycerides: 163 mg/dL — ABNORMAL HIGH (ref <150)

## 2020-03-26 LAB — HEMOGLOBIN A1C
Hgb A1c MFr Bld: 5.7 % of total Hgb — ABNORMAL HIGH (ref <5.7)
Mean Plasma Glucose: 117 (calc)
eAG (mmol/L): 6.5 (calc)

## 2020-03-26 LAB — TSH: TSH: 1.29 mIU/L

## 2020-03-26 MED ORDER — CITALOPRAM HYDROBROMIDE 20 MG PO TABS: 20.0000 mg | ORAL_TABLET | Freq: Every day | ORAL | 3 refills | Status: DC

## 2020-03-26 MED ORDER — ICOSAPENT ETHYL 1 G PO CAPS: 2.0000 g | ORAL_CAPSULE | Freq: Two times a day (BID) | ORAL | 3 refills | Status: DC

## 2020-03-26 MED ORDER — METFORMIN HCL ER 500 MG PO TB24: 500.0000 mg | ORAL_TABLET | Freq: Every day | ORAL | 3 refills | Status: DC

## 2020-03-26 MED FILL — METFORMIN HCL ER 500 MG TB2: 500 | 90 days supply | Qty: 90 | Fill #0

## 2020-03-26 NOTE — Progress Notes (Signed)
Hemoglobin A1c was slightly elevated and indicates pre-diabetes. Please work on decreasing your carbohydrate and sugar intake and regular exercise to help get this down. Walking at least 30 minutes a day at a brisk pace is a great way to start.   Kidney function, liver function, and electrolytes all good.   Slightly anemic for the past year. No real change here. Recommend over the counter iron supplement that may help with this.   Cholesterol levels were elevated. LDL (bad cholesterol) is best less than 100. Triglycerides are elevated, too. HDL (good cholesterol) is ok. I suggest working on diet and exercise to help reduce cholesterol levels and prevent cardiovascular disease. I would like to recheck these in 6 months to see if there is improvement. If not, we can consider starting a medication to help reduce the numbers and prevent risks.   TSH looks great.

## 2020-03-26 NOTE — Patient Instructions (Addendum)
General Preventive Care  Most recent routine screening labs: done!    Prediabetic range sugars - plan to monitor every 6 months  Cholesterol - increase Vascepa   Blood pressure goal 130/80 or less.   Tobacco: don't!  Alcohol: responsible moderation is ok for most adults - if you have concerns about your alcohol intake, please talk to me!   Exercise: as tolerated to reduce risk of cardiovascular disease and diabetes. Strength training will also prevent osteoporosis.   Mental health: if need for mental health care (medicines, counseling, other), or concerns about moods, please let me know!   Sexual / Reproductive health: if need for STD testing, or if concerns with libido/pain problems, please let me know!   Advanced Directive: Living Will and/or Healthcare Power of Attorney recommended for all adults, regardless of age or health.  Vaccines  Flu vaccine: for almost everyone, every fall.   Shingles vaccine: after age 55.   Pneumonia vaccines: after age 40,  Tetanus booster: every 10 years. Due 2028  COVID vaccine: THANKS for getting your vaccine! :)  Cancer screenings   Colon cancer screening: for everyone age 14-75. Colonoscopy due 2028  Breast cancer screening: mammogram due 04/2020  Cervical cancer screening: Pap due 2025  Lung cancer screening: not needed for non-smokers Infection screenings  . HIV: recommended screening at least once age 51-65 . Gonorrhea/Chlamydia: screening as needed, . Hepatitis C: recommended once for everyone age 65-75 . TB: certain at-risk populations Other . Bone Density Test: recommended for women at age 9

## 2020-03-26 NOTE — Progress Notes (Signed)
Kristine Stevens is a 54 y.o. female who presents to  [facility] & [facility] Medicine at [facility]  today, 03/26/20, seeking care for the following:  . Annual . Cerumen impaction L ear few weeks unable to hear      ASSESSMENT & PLAN with other pertinent findings:  The primary encounter diagnosis was Annual physical exam. Diagnoses of Prediabetes, Mixed hyperlipidemia, and Impacted cerumen of left ear were also pertinent to this visit.   No results found for this or any previous visit (from the past 24 hour(s)).   The 10-year ASCVD risk score Denman George DC Montez Hageman., et al., 2013) is: 6.8%   Values used to calculate the score:     Age: 65 years     Sex: Female     Is Non-Hispanic African American: No     Diabetic: Yes     Tobacco smoker: No     Systolic Blood Pressure: 142 mmHg     Is BP treated: Yes     HDL Cholesterol: 53 mg/dL     Total Cholesterol: 246 mg/dL   Patient Instructions  General Preventive Care  Most recent routine screening labs: done!    Prediabetic range sugars - plan to monitor every 6 months  Cholesterol - increase Vascepa   Blood pressure goal 130/80 or less.   Tobacco: don't!  Alcohol: responsible moderation is ok for most adults - if you have concerns about your alcohol intake, please talk to me!   Exercise: as tolerated to reduce risk of cardiovascular disease and diabetes. Strength training will also prevent osteoporosis.   Mental health: if need for mental health care (medicines, counseling, other), or concerns about moods, please let me know!   Sexual / Reproductive health: if need for STD testing, or if concerns with libido/pain problems, please let me know!   Advanced Directive: Living Will and/or Healthcare Power of Attorney recommended for all adults, regardless of age or health.  Vaccines  Flu vaccine: for almost everyone, every fall.   Shingles vaccine: after age 61.   Pneumonia vaccines: after age 48,  Tetanus  booster: every 10 years. Due 2028  COVID vaccine: THANKS for getting your vaccine! :)  Cancer screenings   Colon cancer screening: for everyone age 92-75. Colonoscopy due 2028  Breast cancer screening: mammogram due 04/2020  Cervical cancer screening: Pap due 2025  Lung cancer screening: not needed for non-smokers Infection screenings  . HIV: recommended screening at least once age 46-65 . Gonorrhea/Chlamydia: screening as needed, . Hepatitis C: recommended once for everyone age 34-75 . TB: certain at-risk populations Other . Bone Density Test: recommended for women at age 48   No orders of the defined types were placed in this encounter.   Meds ordered this encounter  Medications  . citalopram (CELEXA) 20 MG tablet    Sig: Take 1 tablet (20 mg total) by mouth daily.    Dispense:  90 tablet    Refill:  3  . icosapent Ethyl (VASCEPA) 1 g capsule    Sig: Take 2 capsules (2 g total) by mouth 2 (two) times daily.    Dispense:  360 capsule    Refill:  3  . metFORMIN (GLUCOPHAGE XR) 500 MG 24 hr tablet    Sig: Take 1 tablet (500 mg total) by mouth daily with breakfast.    Dispense:  90 tablet    Refill:  3       Follow-up instructions: Return in about 6 months (around  09/25/2020) for labs: recheck A1C and lipids.                                         BP (!) 142/89 (BP Location: Left Arm, Patient Position: Sitting)   Pulse 73   Wt 184 lb (83.5 kg)   SpO2 100%   BMI 35.94 kg/m   Current Meds  Medication Sig  . amoxicillin-clavulanate (AUGMENTIN) 875-125 MG tablet Take 1 tablet by mouth 2 (two) times daily for 5 days.  Marland Kitchen aspirin 81 MG tablet Take 81 mg by mouth daily.  . ciprofloxacin-dexamethasone (CIPRODEX) OTIC suspension Place 4 drops into the left ear 2 (two) times daily for 7 days.  . citalopram (CELEXA) 20 MG tablet Take 1 tablet (20 mg total) by mouth daily.  Marland Kitchen gabapentin (NEURONTIN) 300 MG capsule Take 1 capsule (300  mg total) by mouth 2 (two) times daily as needed.  . hydrochlorothiazide (HYDRODIURIL) 12.5 MG tablet Take 1 tablet (12.5 mg total) by mouth daily.  . metoprolol tartrate (LOPRESSOR) 100 MG tablet TAKE 2 HOURS PRIOR TO CT SCAN  . Multiple Vitamins-Minerals (MULTIVITAMIN ADULT PO) Take by mouth daily.  . pantoprazole (PROTONIX) 40 MG tablet Take 1 tablet (40 mg total) by mouth daily.  . [DISCONTINUED] Icosapent Ethyl 1 g CAPS Take 1 capsule (1 g total) by mouth 2 (two) times daily.    Results for orders placed or performed in visit on 03/21/20 (from the past 72 hour(s))  COMPLETE METABOLIC PANEL WITH GFR     Status: Abnormal   Collection Time: 03/25/20 10:02 AM  Result Value Ref Range   Glucose, Bld 100 (H) 65 - 99 mg/dL    Comment: .            Fasting reference interval . For someone without known diabetes, a glucose value between 100 and 125 mg/dL is consistent with prediabetes and should be confirmed with a follow-up test. .    BUN 12 7 - 25 mg/dL   Creat 6.16 0.73 - 7.10 mg/dL    Comment: For patients >69 years of age, the reference limit for Creatinine is approximately 13% higher for people identified as African-American. .    GFR, Est Non African American 96 > OR = 60 mL/min/1.64m2   GFR, Est African American 111 > OR = 60 mL/min/1.55m2   BUN/Creatinine Ratio NOT APPLICABLE 6 - 22 (calc)   Sodium 139 135 - 146 mmol/L   Potassium 3.7 3.5 - 5.3 mmol/L   Chloride 102 98 - 110 mmol/L   CO2 31 20 - 32 mmol/L   Calcium 9.3 8.6 - 10.4 mg/dL   Total Protein 6.8 6.1 - 8.1 g/dL   Albumin 4.3 3.6 - 5.1 g/dL   Globulin 2.5 1.9 - 3.7 g/dL (calc)   AG Ratio 1.7 1.0 - 2.5 (calc)   Total Bilirubin 0.4 0.2 - 1.2 mg/dL   Alkaline phosphatase (APISO) 56 37 - 153 U/L   AST 14 10 - 35 U/L   ALT 14 6 - 29 U/L  CBC with Differential     Status: Abnormal   Collection Time: 03/25/20 10:02 AM  Result Value Ref Range   WBC 4.5 3.8 - 10.8 Thousand/uL   RBC 4.05 3.80 - 5.10 Million/uL    Hemoglobin 11.6 (L) 11.7 - 15.5 g/dL   HCT 62.6 (L) 35 - 45 %   MCV 85.2 80.0 -  100.0 fL   MCH 28.6 27.0 - 33.0 pg   MCHC 33.6 32.0 - 36.0 g/dL   RDW 50.3 88.8 - 28.0 %   Platelets 312 140 - 400 Thousand/uL   MPV 9.3 7.5 - 12.5 fL   Neutro Abs 1,814 1,500 - 7,800 cells/uL   Lymphs Abs 2,120 850 - 3,900 cells/uL   Absolute Monocytes 419 200 - 950 cells/uL   Eosinophils Absolute 131 15 - 500 cells/uL   Basophils Absolute 18 0 - 200 cells/uL   Neutrophils Relative % 40.3 %   Total Lymphocyte 47.1 %   Monocytes Relative 9.3 %   Eosinophils Relative 2.9 %   Basophils Relative 0.4 %  Lipid panel     Status: Abnormal   Collection Time: 03/25/20 10:02 AM  Result Value Ref Range   Cholesterol 246 (H) <200 mg/dL   HDL 53 > OR = 50 mg/dL   Triglycerides 034 (H) <150 mg/dL   LDL Cholesterol (Calc) 162 (H) mg/dL (calc)    Comment: Reference range: <100 . Desirable range <100 mg/dL for primary prevention;   <70 mg/dL for patients with CHD or diabetic patients  with > or = 2 CHD risk factors. Marland Kitchen LDL-C is now calculated using the Martin-Hopkins  calculation, which is a validated novel method providing  better accuracy than the Friedewald equation in the  estimation of LDL-C.  Horald Pollen et al. Lenox Ahr. 9179;150(56): 2061-2068  (http://education.[employer].com/faq/FAQ164)    Total CHOL/HDL Ratio 4.6 <5.0 (calc)   Non-HDL Cholesterol (Calc) 193 (H) <130 mg/dL (calc)    Comment: For patients with diabetes plus 1 major ASCVD risk  factor, treating to a non-HDL-C goal of <100 mg/dL  (LDL-C of <97 mg/dL) is considered a therapeutic  option.   HgB A1c     Status: Abnormal   Collection Time: 03/25/20 10:02 AM  Result Value Ref Range   Hgb A1c MFr Bld 5.7 (H) <5.7 % of total Hgb    Comment: For someone without known diabetes, a hemoglobin  A1c value between 5.7% and 6.4% is consistent with prediabetes and should be confirmed with a  follow-up test. . For someone with known diabetes, a  value <7% indicates that their diabetes is well controlled. A1c targets should be individualized based on duration of diabetes, age, comorbid conditions, and other considerations. . This assay result is consistent with an increased risk of diabetes. . Currently, no consensus exists regarding use of hemoglobin A1c for diagnosis of diabetes for children. .    Mean Plasma Glucose 117 (calc)   eAG (mmol/L) 6.5 (calc)  TSH     Status: None   Collection Time: 03/25/20 10:02 AM  Result Value Ref Range   TSH 1.29 mIU/L    Comment:           Reference Range .           > or = 20 Years  0.40-4.50 .                Pregnancy Ranges           First trimester    0.26-2.66           Second trimester   0.55-2.73           Third trimester    0.43-2.91     No results found.     All questions at time of visit were answered - patient instructed to contact office with any additional concerns or updates.  ER/RTC precautions were  reviewed with the patient as applicable.   Please note: voice recognition software was used to produce this document, and typos may escape review. Please contact Dr. Lyn HollingsheadAlexander for any needed clarifications.

## 2020-04-15 ENCOUNTER — Ambulatory Visit (HOSPITAL_BASED_OUTPATIENT_CLINIC_OR_DEPARTMENT_OTHER): Payer: 59

## 2020-04-15 MED FILL — CITALOPRAM HBR 20 MG TABLET: 20 | 90 days supply | Qty: 90 | Fill #0

## 2020-04-21 ENCOUNTER — Other Ambulatory Visit: Payer: Self-pay | Admitting: Osteopathic Medicine

## 2020-04-21 MED FILL — HYDROCHLOROTHIAZIDE 12.5 MG: 12.5 | 90 days supply | Qty: 90 | Fill #0

## 2020-04-21 MED FILL — VASCEPA 1 GM CAPSULE: 1 | 90 days supply | Qty: 360 | Fill #0

## 2020-04-28 ENCOUNTER — Other Ambulatory Visit (HOSPITAL_BASED_OUTPATIENT_CLINIC_OR_DEPARTMENT_OTHER): Payer: 59

## 2020-05-13 ENCOUNTER — Other Ambulatory Visit: Payer: Self-pay | Admitting: Osteopathic Medicine

## 2020-05-13 ENCOUNTER — Encounter: Payer: Self-pay | Admitting: Osteopathic Medicine

## 2020-05-13 MED FILL — GABAPENTIN 300 MG CAPSULE: 300 | 90 days supply | Qty: 180 | Fill #0

## 2020-05-19 ENCOUNTER — Encounter: Payer: Self-pay | Admitting: Osteopathic Medicine

## 2020-05-19 ENCOUNTER — Ambulatory Visit (INDEPENDENT_AMBULATORY_CARE_PROVIDER_SITE_OTHER): Payer: 59 | Admitting: Osteopathic Medicine

## 2020-05-19 ENCOUNTER — Other Ambulatory Visit: Payer: Self-pay

## 2020-05-19 ENCOUNTER — Other Ambulatory Visit: Payer: Self-pay | Admitting: Osteopathic Medicine

## 2020-05-19 VITALS — BP 134/84 | HR 76 | Wt 172.0 lb

## 2020-05-19 DIAGNOSIS — Z1231 Encounter for screening mammogram for malignant neoplasm of breast: Secondary | ICD-10-CM

## 2020-05-19 DIAGNOSIS — F4321 Adjustment disorder with depressed mood: Secondary | ICD-10-CM

## 2020-05-19 MED ORDER — CITALOPRAM HYDROBROMIDE 20 MG PO TABS: 40.0000 mg | ORAL_TABLET | Freq: Every day | ORAL | Status: DC

## 2020-05-19 NOTE — Progress Notes (Signed)
Kristine Stevens is a 54 y.o. female who presents to  [facility] Primary Care & [facility] Medicine at [facility]  today, 05/19/20, seeking care for the following:  . Depression & Grief - see MyChart message, recent death of son, this is 3 sons passed away over 10 years. Currently on Celexa 20 mg and has been on this for a long time.      ASSESSMENT & PLAN with other pertinent findings:  The encounter diagnosis was Grieving and depression .Will increase Celexa. Discussion of option for benzodiazepine limited Rx if panic symptoms recur.     Meds ordered this encounter  Medications  . citalopram (CELEXA) 20 MG tablet    Sig: Take 2 tablets (40 mg total) by mouth daily.       Follow-up instructions: Return in about 2 weeks (around 06/02/2020) for VIRTUAL VISIT OR IN-OFFICE VISIT FOLLOW UP MENTAL HEALTH .                                         BP 134/84 (BP Location: Right Arm, Patient Position: Sitting)   Pulse 76   Wt 172 lb (78 kg)   SpO2 100%   BMI 33.59 kg/m   No outpatient medications have been marked as taking for the 05/19/20 encounter (Office Visit) with Sunnie Nielsen, DO.    No results found for this or any previous visit (from the past 72 hour(s)).  No results found.     All questions at time of visit were answered - patient instructed to contact office with any additional concerns or updates.  ER/RTC precautions were reviewed with the patient as applicable.   Please note: voice recognition software was used to produce this document, and typos may escape review. Please contact Dr. Lyn Hollingshead for any needed clarifications.   Total encounter time: 30 minutes.

## 2020-06-04 ENCOUNTER — Encounter: Payer: Self-pay | Admitting: Osteopathic Medicine

## 2020-06-04 ENCOUNTER — Ambulatory Visit (INDEPENDENT_AMBULATORY_CARE_PROVIDER_SITE_OTHER): Payer: 59

## 2020-06-04 ENCOUNTER — Ambulatory Visit (INDEPENDENT_AMBULATORY_CARE_PROVIDER_SITE_OTHER): Payer: 59 | Admitting: Osteopathic Medicine

## 2020-06-04 ENCOUNTER — Other Ambulatory Visit: Payer: Self-pay

## 2020-06-04 VITALS — BP 125/83 | HR 79 | Wt 172.0 lb

## 2020-06-04 DIAGNOSIS — Z1231 Encounter for screening mammogram for malignant neoplasm of breast: Secondary | ICD-10-CM

## 2020-06-04 DIAGNOSIS — F4321 Adjustment disorder with depressed mood: Secondary | ICD-10-CM | POA: Diagnosis not present

## 2020-06-04 DIAGNOSIS — Z23 Encounter for immunization: Secondary | ICD-10-CM | POA: Diagnosis not present

## 2020-06-04 MED ORDER — CITALOPRAM HYDROBROMIDE 20 MG PO TABS: 30.0000 mg | ORAL_TABLET | Freq: Every day | ORAL | Status: DC

## 2020-06-04 MED ORDER — TRAZODONE HCL 50 MG PO TABS: 25.0000 mg | ORAL_TABLET | Freq: Every evening | ORAL | 1 refills | Status: DC | PRN

## 2020-06-04 MED FILL — traZODone HCL 50 MG TABS: 50 | 45 days supply | Qty: 90 | Fill #0

## 2020-06-04 NOTE — Progress Notes (Signed)
Kristine Stevens is a 54 y.o. female who presents to  [facility] & [facility] Medicine at [facility]  today, 06/04/20, seeking care for the following:  . Follow-up mental health.      ASSESSMENT & PLAN with other pertinent findings:  The primary encounter diagnosis was Grieving and depression . Diagnoses of Flu vaccine need and Need for shingles vaccine were also pertinent to this visit.  Patient feeling more numb on the higher dose of Celexa.  Still having difficulty sleeping.  See previous notes for details on her situation.  Patient Instructions  Plan:  Reduce Celexa from 40 mg (2 tablets) to 30 mg (1.5 tablets) daily.  Add Trazodone 25-100 mg (0.5-2 tablets) to help with sleep/anxiety.   Flu shot today  Shingles shot today and 2nd dose in 2-6 months      Orders Placed This Encounter  Procedures  . Flu Vaccine QUAD 6+ mos PF IM (Fluarix Quad PF)  . Varicella-zoster vaccine IM (Shingrix)    Meds ordered this encounter  Medications  . traZODone (DESYREL) 50 MG tablet    Sig: Take 0.5-2 tablets (25-100 mg total) by mouth at bedtime as needed for sleep.    Dispense:  90 tablet    Refill:  1  . citalopram (CELEXA) 20 MG tablet    Sig: Take 1.5 tablets (30 mg total) by mouth daily.       Follow-up instructions: Return in about 4 weeks (around 07/02/2020) for VIRTUAL VISIT OR IN-OFFICE VISIT RECHECK MENTAL HEALTH AND SLEEP .                                         BP 125/83 (BP Location: Left Arm, Patient Position: Sitting)   Pulse 79   Wt 172 lb (78 kg)   BMI 33.59 kg/m   Current Meds  Medication Sig  . aspirin 81 MG tablet Take 81 mg by mouth daily.  . citalopram (CELEXA) 20 MG tablet Take 1.5 tablets (30 mg total) by mouth daily.  Marland Kitchen gabapentin (NEURONTIN) 300 MG capsule TAKE 2 CAPSULES BY MOUTH AT BEDTIME AS NEEDED  . hydrochlorothiazide (HYDRODIURIL) 12.5 MG tablet TAKE 1 TABLET (12.5 MG TOTAL) BY  MOUTH DAILY.  Marland Kitchen icosapent Ethyl (VASCEPA) 1 g capsule Take 2 capsules (2 g total) by mouth 2 (two) times daily.  . metFORMIN (GLUCOPHAGE XR) 500 MG 24 hr tablet Take 1 tablet (500 mg total) by mouth daily with breakfast.  . Multiple Vitamins-Minerals (MULTIVITAMIN ADULT PO) Take by mouth daily.  . [DISCONTINUED] citalopram (CELEXA) 20 MG tablet Take 2 tablets (40 mg total) by mouth daily.    No results found for this or any previous visit (from the past 72 hour(s)).  MM 3D SCREEN BREAST BILATERAL  Result Date: 06/05/2020 CLINICAL DATA:  Screening. EXAM: DIGITAL SCREENING BILATERAL MAMMOGRAM WITH TOMO AND CAD COMPARISON:  Previous exam(s). ACR Breast Density Category b: There are scattered areas of fibroglandular density. FINDINGS: There are no findings suspicious for malignancy. Images were processed with CAD. IMPRESSION: No mammographic evidence of malignancy. A result letter of this screening mammogram will be mailed directly to the patient. RECOMMENDATION: Screening mammogram in one year. (Code:SM-B-01Y) BI-RADS CATEGORY  1: Negative. Electronically Signed   By: Hulan Saas M.D.   On: 06/05/2020 15:47       All questions at time of visit were answered - patient instructed  to contact office with any additional concerns or updates.  ER/RTC precautions were reviewed with the patient as applicable.   Please note: voice recognition software was used to produce this document, and typos may escape review. Please contact Dr. Lyn Hollingshead for any needed clarifications.

## 2020-06-04 NOTE — Patient Instructions (Signed)
Plan:  Reduce Celexa from 40 mg (2 tablets) to 30 mg (1.5 tablets) daily.  Add Trazodone 25-100 mg (0.5-2 tablets) to help with sleep/anxiety.   Flu shot today  Shingles shot today and 2nd dose in 2-6 months

## 2020-06-16 NOTE — Progress Notes (Deleted)
HPI: FU CP. Seen in ER 6/21 with CP; troponin and chest xray normal; hgb 11.7; LFTs normal. Ddimer normal; BNP normal. CTA, monitor and echocardiogram ordered at last office visit but not performed.  Since last seen   Current Outpatient Medications  Medication Sig Dispense Refill  . aspirin 81 MG tablet Take 81 mg by mouth daily.    . citalopram (CELEXA) 20 MG tablet Take 1.5 tablets (30 mg total) by mouth daily.    Marland Kitchen gabapentin (NEURONTIN) 300 MG capsule TAKE 2 CAPSULES BY MOUTH AT BEDTIME AS NEEDED 180 capsule 1  . hydrochlorothiazide (HYDRODIURIL) 12.5 MG tablet TAKE 1 TABLET (12.5 MG TOTAL) BY MOUTH DAILY. 90 tablet 1  . icosapent Ethyl (VASCEPA) 1 g capsule Take 2 capsules (2 g total) by mouth 2 (two) times daily. 360 capsule 3  . metFORMIN (GLUCOPHAGE XR) 500 MG 24 hr tablet Take 1 tablet (500 mg total) by mouth daily with breakfast. 90 tablet 3  . Multiple Vitamins-Minerals (MULTIVITAMIN ADULT PO) Take by mouth daily.    . pantoprazole (PROTONIX) 40 MG tablet Take 1 tablet (40 mg total) by mouth daily. 30 tablet 0  . traZODone (DESYREL) 50 MG tablet Take 0.5-2 tablets (25-100 mg total) by mouth at bedtime as needed for sleep. 90 tablet 1   No current facility-administered medications for this visit.     Past Medical History:  Diagnosis Date  . GERD (gastroesophageal reflux disease)   . Heartburn   . Hyperlipidemia   . Hypertension   . IBS (irritable bowel syndrome)   . PONV (postoperative nausea and vomiting)    severe  . Prediabetes   . Sphincter of Oddi dysfunction     Past Surgical History:  Procedure Laterality Date  . ABDOMINOPLASTY  05/25/2012  . CESAREAN SECTION  1991, 1998   x2  . CHOLECYSTECTOMY  1995  . IRRIGATION AND DEBRIDEMENT ABSCESS Right 05/22/2013   Procedure: MINOR INCISION AND DRAINAGE OF ABSCESS;  Surgeon: Nicki Reaper, MD;  Location: [facility];  Service: Orthopedics;  Laterality: Right;  . KNEE ARTHROSCOPY Left    x3  .  LAPAROSCOPIC GASTRIC SLEEVE RESECTION N/A 03/25/2015   Procedure: LAPAROSCOPIC GASTRIC SLEEVE RESECTION UPPER ENDOSCOPY;  Surgeon: Luretha Murphy, MD;  Location: [facility];  Service: General;  Laterality: N/A;  . VAGINAL BIRTH AFTER CESAREAN SECTION  1993, 2001   x2    Social History   Socioeconomic History  . Marital status: Married    Spouse name: Not on file  . Number of children: Not on file  . Years of education: Not on file  . Highest education level: Not on file  Occupational History  . Not on file  Tobacco Use  . Smoking status: Never Smoker  . Smokeless tobacco: Never Used  Substance and Sexual Activity  . Alcohol use: Yes    Comment: rare  . Drug use: No  . Sexual activity: Not on file  Other Topics Concern  . Not on file  Social History Narrative  . Not on file   Social Determinants of Health   Financial Resource Strain:   . Difficulty of Paying Living Expenses: Not on file  Food Insecurity:   . Worried About Programme researcher, broadcasting/film/video in the Last Year: Not on file  . Ran Out of Food in the Last Year: Not on file  Transportation Needs:   . Lack of Transportation (Medical): Not on file  . Lack of Transportation (Non-Medical): Not  on file  Physical Activity:   . Days of Exercise per Week: Not on file  . Minutes of Exercise per Session: Not on file  Stress:   . Feeling of Stress : Not on file  Social Connections:   . Frequency of Communication with Friends and Family: Not on file  . Frequency of Social Gatherings with Friends and Family: Not on file  . Attends Religious Services: Not on file  . Active Member of Clubs or Organizations: Not on file  . Attends Banker Meetings: Not on file  . Marital Status: Not on file  Intimate Partner Violence:   . Fear of Current or Ex-Partner: Not on file  . Emotionally Abused: Not on file  . Physically Abused: Not on file  . Sexually Abused: Not on file    Family History  Problem Relation Age of Onset  .  Hypertension Mother   . Hyperlipidemia Mother   . Fibromyalgia Mother   . Hypertension Father   . Breast cancer Maternal Grandmother 68  . Breast cancer Cousin 45  . Colon cancer Neg Hx   . Esophageal cancer Neg Hx   . Rectal cancer Neg Hx   . Stomach cancer Neg Hx     ROS: no fevers or chills, productive cough, hemoptysis, dysphasia, odynophagia, melena, hematochezia, dysuria, hematuria, rash, seizure activity, orthopnea, PND, pedal edema, claudication. Remaining systems are negative.  Physical Exam: Well-developed well-nourished in no acute distress.  Skin is warm and dry.  HEENT is normal.  Neck is supple.  Chest is clear to auscultation with normal expansion.  Cardiovascular exam is regular rate and rhythm.  Abdominal exam nontender or distended. No masses palpated. Extremities show no edema. neuro grossly intact  ECG- personally reviewed  A/P  1 chest pain-  2 hypertension-patient's blood pressure is controlled.  Continue present medical regimen.  3 hyperlipidemia-managed by primary care.  4 palpitations-  5 dyspnea-  Olga Millers, MD

## 2020-06-18 MED FILL — XIIDRA 5 % SOLN: 5 | 90 days supply | Qty: 180 | Fill #0

## 2020-06-20 ENCOUNTER — Other Ambulatory Visit: Payer: Self-pay | Admitting: Osteopathic Medicine

## 2020-06-20 MED ORDER — CITALOPRAM HYDROBROMIDE 20 MG PO TABS: 30.0000 mg | ORAL_TABLET | Freq: Every day | ORAL | 0 refills | Status: DC

## 2020-06-24 MED FILL — CITALOPRAM HBR 20 MG TABLET: 20 | 90 days supply | Qty: 135 | Fill #0

## 2020-06-25 ENCOUNTER — Ambulatory Visit: Payer: 59 | Admitting: Cardiology

## 2020-07-02 ENCOUNTER — Other Ambulatory Visit: Payer: Self-pay

## 2020-07-02 ENCOUNTER — Emergency Department (INDEPENDENT_AMBULATORY_CARE_PROVIDER_SITE_OTHER)
Admission: RE | Admit: 2020-07-02 | Discharge: 2020-07-02 | Disposition: A | Discharge status: Home / Self Care | Payer: 59 | Source: Ambulatory Visit

## 2020-07-02 ENCOUNTER — Encounter: Payer: Self-pay | Admitting: Osteopathic Medicine

## 2020-07-02 ENCOUNTER — Ambulatory Visit (INDEPENDENT_AMBULATORY_CARE_PROVIDER_SITE_OTHER): Payer: 59 | Admitting: Osteopathic Medicine

## 2020-07-02 VITALS — BP 129/79 | HR 77 | Temp 98.2°F | Resp 18

## 2020-07-02 DIAGNOSIS — Z20822 Contact with and (suspected) exposure to covid-19: Secondary | ICD-10-CM | POA: Diagnosis not present

## 2020-07-02 DIAGNOSIS — Z5329 Procedure and treatment not carried out because of patient's decision for other reasons: Secondary | ICD-10-CM

## 2020-07-02 DIAGNOSIS — J029 Acute pharyngitis, unspecified: Secondary | ICD-10-CM

## 2020-07-02 LAB — POCT RAPID STREP A (OFFICE): Rapid Strep A Screen: NEGATIVE

## 2020-07-02 MED ORDER — AMOXICILLIN-POT CLAVULANATE 875-125 MG PO TABS
1.0000 | ORAL_TABLET | Freq: Two times a day (BID) | ORAL | 0 refills | Status: DC
Start: 2020-07-02 — End: 2020-10-16

## 2020-07-02 MED ORDER — AMOXICILLIN-POT CLAVULANATE 875-125 MG PO TABS
1.0000 | ORAL_TABLET | Freq: Two times a day (BID) | ORAL | 0 refills | Status: DC
Start: 2020-07-02 — End: 2020-07-02

## 2020-07-02 NOTE — ED Triage Notes (Signed)
Patient presents to Urgent Care with complaints of sore throat since yesterday. Patient reports she spent a lot of yesterday crying and thought that's why she had the sore throat. Pt's daughter just found out today that she is covid positive. Pt does not live w/ her daughter, but they did have lunch together a few days ago. States she can see white patches on the back of her throat, also has body aches.

## 2020-07-02 NOTE — Discharge Instructions (Signed)
Rapid strep negative. Treating you based on exam findings are consistent with an acute bacterial throat infection.

## 2020-07-02 NOTE — ED Provider Notes (Signed)
[facility]    CSN: [id number] Arrival date & time: 07/02/20  1511      History   Chief Complaint Chief Complaint  Patient presents with  . Appointment    3:00  . Sore Throat    HPI Kristine Stevens is a 54 y.o. female.   HPI  Patient presents today with complaint of a sore throat. Patient works in healthcare and has had a recent exposure to her daughter who tested positive for COVID-19 on yesterday. Patient has had no fever and no other symptoms. She endorses that her throat is painful with swallowing. She has had mild nausea without vomitus. Mild headache and generalized body aches. Past Medical History:  Diagnosis Date  . GERD (gastroesophageal reflux disease)   . Heartburn   . Hyperlipidemia   . Hypertension   . IBS (irritable bowel syndrome)   . PONV (postoperative nausea and vomiting)    severe  . Prediabetes   . Sphincter of Oddi dysfunction     Patient Active Problem List   Diagnosis Date Noted  . Statin intolerance 04/12/2019  . Sicca syndrome, unspecified 02/05/2019  . Elevated transaminase measurement 11/02/2017  . Right foot pain 01/27/2017  . History of high cholesterol 05/27/2016  . History of gestational diabetes 05/27/2016  . History of gestational hypertension 05/27/2016  . History of acute pancreatitis 05/27/2016  . Acid reflux 04/17/2015  . Prediabetes 04/17/2015  . 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 Hyperlipemia 01/02/2015  . Dysfunction of sphincter of Oddi 08/16/2013    Past Surgical History:  Procedure Laterality Date  . ABDOMINOPLASTY  05/25/2012  . CESAREAN SECTION  1991, 1998   x2  . CHOLECYSTECTOMY  1995  . IRRIGATION AND DEBRIDEMENT ABSCESS Right 05/22/2013   Procedure: MINOR INCISION AND DRAINAGE OF ABSCESS;  Surgeon: Nicki Reaper, MD;  Location: [facility];  Service: Orthopedics;  Laterality: Right;  . KNEE ARTHROSCOPY Left    x3  . LAPAROSCOPIC GASTRIC SLEEVE RESECTION  N/A 03/25/2015   Procedure: LAPAROSCOPIC GASTRIC SLEEVE RESECTION UPPER ENDOSCOPY;  Surgeon: Luretha Murphy, MD;  Location: [facility];  Service: General;  Laterality: N/A;  . VAGINAL BIRTH AFTER CESAREAN SECTION  1993, 2001   x2    OB History   No obstetric history on file.      Home Medications    Prior to Admission medications   Medication Sig Start Date End Date Taking? Authorizing Provider  aspirin 81 MG tablet Take 81 mg by mouth daily.    [provider]  citalopram (CELEXA) 20 MG tablet Take 1.5 tablets (30 mg total) by mouth daily. 06/20/20   Sunnie Nielsen, DO  gabapentin (NEURONTIN) 300 MG capsule TAKE 2 CAPSULES BY MOUTH AT BEDTIME AS NEEDED 05/13/20   Sunnie Nielsen, DO  hydrochlorothiazide (HYDRODIURIL) 12.5 MG tablet TAKE 1 TABLET (12.5 MG TOTAL) BY MOUTH DAILY. 04/21/20   Sunnie Nielsen, DO  icosapent Ethyl (VASCEPA) 1 g capsule Take 2 capsules (2 g total) by mouth 2 (two) times daily. 03/26/20 06/24/20  Sunnie Nielsen, DO  metFORMIN (GLUCOPHAGE XR) 500 MG 24 hr tablet Take 1 tablet (500 mg total) by mouth daily with breakfast. 03/26/20   Sunnie Nielsen, DO  Multiple Vitamins-Minerals (MULTIVITAMIN ADULT PO) Take by mouth daily.    [provider]  pantoprazole (PROTONIX) 40 MG tablet Take 1 tablet (40 mg total) by mouth daily. 03/04/20 04/03/20  Maia Plan, MD  traZODone (DESYREL) 50 MG tablet Take 0.5-2  tablets (25-100 mg total) by mouth at bedtime as needed for sleep. 06/04/20   Sunnie Nielsen, DO    Family History Family History  Problem Relation Age of Onset  . Hypertension Mother   . Hyperlipidemia Mother   . Fibromyalgia Mother   . Hypertension Father   . Breast cancer Maternal Grandmother 69  . Breast cancer Cousin 45  . Colon cancer Neg Hx   . Esophageal cancer Neg Hx   . Rectal cancer Neg Hx   . Stomach cancer Neg Hx     Social History Social History   Tobacco Use  . Smoking status: Never Smoker  . Smokeless  tobacco: Never Used  Substance Use Topics  . Alcohol use: Yes    Comment: rare  . Drug use: No     Allergies   Patient has no known allergies. Review of Systems Review of Systems Pertinent negatives listed in HPI Physical Exam Triage Vital Signs ED Triage Vitals  Enc Vitals Group     BP 07/02/20 1527 129/79     Pulse Rate 07/02/20 1527 77     Resp 07/02/20 1527 18     Temp 07/02/20 1527 98.2 F (36.8 C)     Temp Source 07/02/20 1527 Oral     SpO2 07/02/20 1527 99 %     Weight --      Height --      Head Circumference --      Peak Flow --      Pain Score 07/02/20 1525 1     Pain Loc --      Pain Edu? --      Excl. in GC? --    No data found.  Updated Vital Signs BP 129/79 (BP Location: Right Arm)   Pulse 77   Temp 98.2 F (36.8 C) (Oral)   Resp 18   SpO2 99%   Visual Acuity Right Eye Distance:   Left Eye Distance:   Bilateral Distance:    Right Eye Near:   Left Eye Near:    Bilateral Near:     Physical Exam HENT:     Head: Normocephalic.     Mouth/Throat:     Pharynx: Oropharyngeal exudate, posterior oropharyngeal erythema and uvula swelling present.  Cardiovascular:     Rate and Rhythm: Normal rate and regular rhythm.  Lymphadenopathy:     Cervical: Cervical adenopathy present.  Skin:    General: Skin is warm and dry.     Capillary Refill: Capillary refill takes less than 2 seconds.  Neurological:     General: No focal deficit present.     Mental Status: She is alert.      UC Treatments / Results  Labs (all labs ordered are listed, but only abnormal results are displayed) Labs Reviewed  NOVEL CORONAVIRUS, NAA  POCT RAPID STREP A (OFFICE)    EKG   Radiology No results found.  Procedures Procedures (including critical care time)  Medications Ordered in UC Medications - No data to display  Initial Impression / Assessment and Plan / UC Course  I have reviewed the triage vital signs and the nursing notes.  Pertinent labs &  imaging results that were available during my care of the patient were reviewed by me and considered in my medical decision making (see chart for details).    Treating based on exam findings throat appearance is consistent with that of a possible streptococcal infections.  However given our office is currently out of strep collection  swabs will treat empirically for strep with Augmentin twice daily x10 days.  Covid test is pending given recent exposure.  Patient is afebrile.  Patient is also fully vaccinated.  Encouraged to wear a mask per CDC guidelines and avoid high risk groups of people until results are known . Final Clinical Impressions(s) / UC Diagnoses   Final diagnoses:  Close exposure to COVID-19 virus  Pharyngitis, unspecified etiology     Discharge Instructions     Rapid strep negative. Treating you based on exam findings are consistent with an acute bacterial throat infection.    ED Prescriptions    Medication Sig Dispense Auth. Provider   amoxicillin-clavulanate (AUGMENTIN) 875-125 MG tablet  (Status: Discontinued) Take 1 tablet by mouth 2 (two) times daily. 20 tablet Bing Neighbors, FNP   amoxicillin-clavulanate (AUGMENTIN) 875-125 MG tablet Take 1 tablet by mouth 2 (two) times daily. 20 tablet Bing Neighbors, FNP     PDMP not reviewed this encounter.   Bing Neighbors, FNP 07/04/20 1004

## 2020-07-05 LAB — SARS-COV-2, NAA 2 DAY TAT

## 2020-07-05 LAB — NOVEL CORONAVIRUS, NAA: SARS-CoV-2, NAA: NOT DETECTED

## 2020-07-09 ENCOUNTER — Other Ambulatory Visit: Payer: Self-pay | Admitting: Osteopathic Medicine

## 2020-07-09 ENCOUNTER — Encounter: Payer: Self-pay | Admitting: Osteopathic Medicine

## 2020-07-09 ENCOUNTER — Ambulatory Visit (INDEPENDENT_AMBULATORY_CARE_PROVIDER_SITE_OTHER): Payer: 59 | Admitting: Osteopathic Medicine

## 2020-07-09 VITALS — BP 131/87 | HR 83 | Wt 163.0 lb

## 2020-07-09 DIAGNOSIS — F5101 Primary insomnia: Secondary | ICD-10-CM

## 2020-07-09 DIAGNOSIS — F4321 Adjustment disorder with depressed mood: Secondary | ICD-10-CM

## 2020-07-09 MED ORDER — AMITRIPTYLINE HCL 50 MG PO TABS: 25.0000 mg | ORAL_TABLET | Freq: Every evening | ORAL | 0 refills | Status: DC | PRN

## 2020-07-09 MED FILL — AMITRIPTYLINE HCL 50 MG TAB: 50 | 45 days supply | Qty: 90 | Fill #0

## 2020-07-09 NOTE — Patient Instructions (Addendum)
Plan: STOP Trazodone  START Elavil / amitriptyline  CONTINUE Celexa 30 mg  CONSIDER Genesight testing (genetic testing for medication "compatibility")

## 2020-07-09 NOTE — Progress Notes (Signed)
Kristine Stevens is a 54 y.o. female who presents to  [facility] at [facility]  today, 06/04/20, seeking care for the following:  . Follow-up mental health.      ASSESSMENT & PLAN with other pertinent findings:  The primary encounter diagnosis was Grieving and depression . A diagnosis of Primary insomnia was also pertinent to this visit.  At last visit 06/04/2020, Kristine Stevens was feeling more numb on the higher dose of Celexa.  Still having difficulty sleeping. --> reduced dose Celexa from 40 mg to 30 mg. Added Trazodone 25-10 mg qhs   Today 07/09/20: Reports feeling down, was not as involved in her grandsons recent birth as she would have liked to be, which is of course compounded by the recent death of her son/grandson's father.  Overall, patient feels mentally that she is holding together pretty well, sleep is still an issue but alternating shift work is definitely an issue.  She finds that the trazodone works pretty well, feels sleepy within half an hour but sleep maintenance is not good. -->  We will trial alternate TCA to help with sleep.  May consider Belsomra or similar, but alternating sleep schedule makes this difficult.   Patient Instructions  Plan: STOP Trazodone  START Elavil / amitriptyline  CONTINUE Celexa 30 mg  CONSIDER Genesight testing (genetic testing for medication "compatibility")       Depression screen Kristine Stevens 2/9 07/09/2020 05/19/2020 03/26/2020  Decreased Interest 1 1 0  Down, Depressed, Hopeless 2 1 1   PHQ - 2 Score 3 2 1   Altered sleeping 3 - 2  Tired, decreased energy 2 - 1  Change in appetite 1 - 0  Feeling bad or failure about yourself  1 - 0  Trouble concentrating 1 - 0  Moving slowly or fidgety/restless 0 - 0  Suicidal thoughts 0 - 0  PHQ-9 Score 11 - 4  Difficult doing work/chores Somewhat difficult - Not difficult at all   GAD 7 : Generalized Anxiety Score 07/09/2020 03/26/2020  Nervous, Anxious, on Edge 0  0  Control/stop worrying 1 1  Worry too much - different things 0 1  Trouble relaxing 1 1  Restless 0 0  Easily annoyed or irritable 1 0  Afraid - awful might happen 0 1  Total GAD 7 Score 3 4  Anxiety Difficulty Somewhat difficult Not difficult at all       No orders of the defined types were placed in this encounter.   Meds ordered this encounter  Medications  . amitriptyline (ELAVIL) 50 MG tablet    Sig: Take 0.5-2 tablets (25-100 mg total) by mouth at bedtime as needed for sleep.    Dispense:  90 tablet    Refill:  0       Follow-up instructions: Return in about 4 weeks (around 08/06/2020) for VIRTUAL VISIT, IN-OFFICE VISIT FOLLOW UP MENTAL HEALTH.                                         BP 131/87 (BP Location: Left Arm, Patient Position: Sitting)   Pulse 83   Wt 163 lb (73.9 kg)   SpO2 100%   BMI 31.83 kg/m   Current Meds  Medication Sig  . amoxicillin-clavulanate (AUGMENTIN) 875-125 MG tablet Take 1 tablet by mouth 2 (two) times daily.  03/28/2020 aspirin 81 MG tablet Take 81 mg  by mouth daily.  . citalopram (CELEXA) 20 MG tablet Take 1.5 tablets (30 mg total) by mouth daily.  Marland Kitchen gabapentin (NEURONTIN) 300 MG capsule TAKE 2 CAPSULES BY MOUTH AT BEDTIME AS NEEDED  . hydrochlorothiazide (HYDRODIURIL) 12.5 MG tablet TAKE 1 TABLET (12.5 MG TOTAL) BY MOUTH DAILY.  . metFORMIN (GLUCOPHAGE XR) 500 MG 24 hr tablet Take 1 tablet (500 mg total) by mouth daily with breakfast.  . Multiple Vitamins-Minerals (MULTIVITAMIN ADULT PO) Take by mouth daily.  . [DISCONTINUED] traZODone (DESYREL) 50 MG tablet Take 0.5-2 tablets (25-100 mg total) by mouth at bedtime as needed for sleep.    No results found for this or any previous visit (from the past 72 hour(s)).  No results found.     All questions at time of visit were answered - patient instructed to contact office with any additional concerns or updates.  ER/RTC precautions were reviewed with  the patient as applicable.   Please note: voice recognition software was used to produce this document, and typos may escape review. Please contact Kristine Stevens for any needed clarifications.

## 2020-07-10 MED FILL — METFORMIN HCL ER 500 MG TB2: 500 | 90 days supply | Qty: 90 | Fill #1

## 2020-08-20 MED FILL — VASCEPA 1 GM CAPSULE: 1 | 90 days supply | Qty: 360 | Fill #1

## 2020-08-20 MED FILL — HYDROCHLOROTHIAZIDE 12.5 MG: 12.5 | 90 days supply | Qty: 90 | Fill #1

## 2020-09-16 ENCOUNTER — Ambulatory Visit (INDEPENDENT_AMBULATORY_CARE_PROVIDER_SITE_OTHER): Payer: 59 | Admitting: Osteopathic Medicine

## 2020-09-16 ENCOUNTER — Other Ambulatory Visit: Payer: Self-pay | Admitting: Osteopathic Medicine

## 2020-09-16 ENCOUNTER — Encounter: Payer: Self-pay | Admitting: Osteopathic Medicine

## 2020-09-16 VITALS — BP 137/89 | HR 77 | Temp 97.7°F | Wt 148.1 lb

## 2020-09-16 DIAGNOSIS — E782 Mixed hyperlipidemia: Secondary | ICD-10-CM | POA: Diagnosis not present

## 2020-09-16 DIAGNOSIS — Z23 Encounter for immunization: Secondary | ICD-10-CM

## 2020-09-16 DIAGNOSIS — F4321 Adjustment disorder with depressed mood: Secondary | ICD-10-CM

## 2020-09-16 DIAGNOSIS — F5101 Primary insomnia: Secondary | ICD-10-CM

## 2020-09-16 DIAGNOSIS — R7303 Prediabetes: Secondary | ICD-10-CM

## 2020-09-16 MED ORDER — LOSARTAN POTASSIUM-HCTZ 50-12.5 MG PO TABS: 1.0000 | ORAL_TABLET | Freq: Every day | ORAL | 0 refills | Status: DC

## 2020-09-16 MED FILL — LOSARTAN-HCTZ 50-12.5 MG TA: 50-12.5 | 30 days supply | Qty: 30 | Fill #0

## 2020-09-16 NOTE — Progress Notes (Signed)
Kristine Stevens is a 54 y.o. female who presents to  [facility] at [facility]  today, 09/16/20, seeking care for the following:  . Follow-up mental health and prediabetes, hyperlipidemia     ASSESSMENT & PLAN with other pertinent findings:  The primary encounter diagnosis was Prediabetes. Diagnoses of Mixed hyperlipidemia, Grieving and depression , Primary insomnia, and Need for shingles vaccine were also pertinent to this visit.   1. Prediabetes Last A1C 02/2020 was 5.7, has been stable in prediabetic range for at least 5 years, Hx GDM.   2. Mixed hyperlipidemia Last LDL 02/2020 was 162, HDL was 53, TG also elevated at 163 Hx statin intolerance to atorvastatin 40 Currently on Vascepa started 02/2020  3. Grieving and depression  Pt reports stable, coping okay with death of her son. She is connecting with her daughter-in law, his widow, and her grandkids, his kids   4. Primary insomnia Doing better on elavil as opposed to trazodone   No results found for this or any previous visit (from the past 24 hour(s)).  Immunization History  Administered Date(s) Administered  . Influenza,inj,Quad PF,6+ Mos 05/27/2016, 06/27/2017, 06/04/2020  . Influenza-Unspecified 07/02/2013, 07/26/2014, 06/28/2019  . Janssen (J&J) SARS-COV-2 Vaccination 01/02/2020  . Tdap 03/21/2017  . Zoster Recombinat (Shingrix) 06/04/2020, 09/16/2020      There are no Patient Instructions on file for this visit.  Orders Placed This Encounter  Procedures  . Varicella-zoster vaccine IM (Shingrix)  . Hemoglobin A1c  . Lipid panel  . COMPLETE METABOLIC PANEL WITH GFR    Meds ordered this encounter  Medications  . losartan-hydrochlorothiazide (HYZAAR) 50-12.5 MG tablet    Sig: Take 1 tablet by mouth daily.    Dispense:  30 tablet    Refill:  0       Follow-up instructions: Return in about 4 weeks (around 10/14/2020) for LAB VISIT ONLY - ANNUAL 6-9 MONTHS  .                                         BP 137/89 (BP Location: Left Arm, Patient Position: Sitting, Cuff Size: Normal)   Pulse 77   Temp 97.7 F (36.5 C) (Oral)   Wt 148 lb 1 oz (67.2 kg)   BMI 28.92 kg/m   No outpatient medications have been marked as taking for the 09/16/20 encounter (Office Visit) with Sunnie Nielsen, DO.    No results found for this or any previous visit (from the past 72 hour(s)).  No results found.     All questions at time of visit were answered - patient instructed to contact office with any additional concerns or updates.  ER/RTC precautions were reviewed with the patient as applicable.   Please note: voice recognition software was used to produce this document, and typos may escape review. Please contact Dr. Lyn Hollingshead for any needed clarifications.

## 2020-10-16 ENCOUNTER — Other Ambulatory Visit: Payer: Self-pay | Admitting: Osteopathic Medicine

## 2020-10-16 MED ORDER — LOSARTAN POTASSIUM-HCTZ 100-12.5 MG PO TABS
1.0000 | ORAL_TABLET | Freq: Every day | ORAL | 3 refills | Status: DC
Start: 2020-10-16 — End: 2020-10-16

## 2020-10-16 MED FILL — LOSARTAN-HCTZ 100-12.5 MG T: 100-12.5 | 30 days supply | Qty: 30 | Fill #0

## 2020-10-17 ENCOUNTER — Other Ambulatory Visit: Payer: Self-pay | Admitting: Osteopathic Medicine

## 2020-10-17 MED FILL — METFORMIN HCL ER 500 MG TB2: 500 | 90 days supply | Qty: 90 | Fill #2

## 2020-10-17 MED FILL — CITALOPRAM HBR 20 MG TABLET: 20 | 90 days supply | Qty: 135 | Fill #0

## 2020-11-19 MED FILL — GABAPENTIN 300 MG CAPSULE: 300 | 90 days supply | Qty: 180 | Fill #0

## 2020-11-19 MED FILL — LOSARTAN-HCTZ 100-12.5 MG T: 100-12.5 | 30 days supply | Qty: 30 | Fill #1

## 2020-12-30 MED FILL — Losartan Potassium & Hydrochlorothiazide Tab 100-12.5 MG: ORAL | 30 days supply | Qty: 30 | Fill #0 | Status: AC

## 2020-12-30 MED FILL — Icosapent Ethyl Cap 1 GM: ORAL | 90 days supply | Qty: 360 | Fill #0 | Status: AC

## 2020-12-31 ENCOUNTER — Other Ambulatory Visit (HOSPITAL_COMMUNITY): Payer: Self-pay

## 2021-01-08 NOTE — Telephone Encounter (Signed)
Monitor was never returned and marked "lost" in the [facility] system

## 2021-01-22 ENCOUNTER — Encounter: Payer: Self-pay | Admitting: Osteopathic Medicine

## 2021-01-23 ENCOUNTER — Other Ambulatory Visit: Payer: Self-pay

## 2021-01-23 ENCOUNTER — Ambulatory Visit: Payer: 59 | Admitting: Family Medicine

## 2021-01-23 ENCOUNTER — Encounter: Payer: Self-pay | Admitting: Family Medicine

## 2021-01-23 DIAGNOSIS — S70261A Insect bite (nonvenomous), right hip, initial encounter: Secondary | ICD-10-CM

## 2021-01-23 DIAGNOSIS — W57XXXA Bitten or stung by nonvenomous insect and other nonvenomous arthropods, initial encounter: Secondary | ICD-10-CM

## 2021-01-23 MED ORDER — DOXYCYCLINE HYCLATE 100 MG PO TABS: 100.0000 mg | ORAL_TABLET | Freq: Two times a day (BID) | ORAL | 0 refills | Status: AC

## 2021-01-25 DIAGNOSIS — W57XXXA Bitten or stung by nonvenomous insect and other nonvenomous arthropods, initial encounter: Secondary | ICD-10-CM | POA: Insufficient documentation

## 2021-01-25 NOTE — Progress Notes (Signed)
Kristine Stevens - 55 y.o. female MRN [id number]  Date of birth: [birth year]  Subjective Chief Complaint  Patient presents with  . Tick Removal    HPI Kristine Stevens is a 55 y.o. female here today with complaint of tick bite.  She removed a tick last week.  Reports that tick was very engorged and looked like it had been present for a couple of days.  She has developed some redness and irritation around site of bite.  She has also had some headache and chills, unsure if this is related.  She denies rash, abdominal pain, nausea, fever.   ROS:  A comprehensive ROS was completed and negative except as noted per HPI  No Known Allergies  Past Medical History:  Diagnosis Date  . GERD (gastroesophageal reflux disease)   . Heartburn   . Hyperlipidemia   . Hypertension   . IBS (irritable bowel syndrome)   . PONV (postoperative nausea and vomiting)    severe  . Prediabetes   . Sphincter of Oddi dysfunction     Past Surgical History:  Procedure Laterality Date  . ABDOMINOPLASTY  05/25/2012  . CESAREAN SECTION  1991, 1998   x2  . CHOLECYSTECTOMY  1995  . IRRIGATION AND DEBRIDEMENT ABSCESS Right 05/22/2013   Procedure: MINOR INCISION AND DRAINAGE OF ABSCESS;  Surgeon: Nicki Reaper, MD;  Location: [facility];  Service: Orthopedics;  Laterality: Right;  . KNEE ARTHROSCOPY Left    x3  . LAPAROSCOPIC GASTRIC SLEEVE RESECTION N/A 03/25/2015   Procedure: LAPAROSCOPIC GASTRIC SLEEVE RESECTION UPPER ENDOSCOPY;  Surgeon: Luretha Murphy, MD;  Location: [facility];  Service: General;  Laterality: N/A;  . VAGINAL BIRTH AFTER CESAREAN SECTION  1993, 2001   x2    Social History   Socioeconomic History  . Marital status: Married    Spouse name: Not on file  . Number of children: Not on file  . Years of education: Not on file  . Highest education level: Not on file  Occupational History  . Not on file  Tobacco Use  . Smoking status: Never Smoker  . Smokeless tobacco: Never Used   Substance and Sexual Activity  . Alcohol use: Yes    Comment: rare  . Drug use: No  . Sexual activity: Not on file  Other Topics Concern  . Not on file  Social History Narrative  . Not on file   Social Determinants of Health   Financial Resource Strain: Not on file  Food Insecurity: Not on file  Transportation Needs: Not on file  Physical Activity: Not on file  Stress: Not on file  Social Connections: Not on file    Family History  Problem Relation Age of Onset  . Hypertension Mother   . Hyperlipidemia Mother   . Fibromyalgia Mother   . Hypertension Father   . Breast cancer Maternal Grandmother 11  . Breast cancer Cousin 45  . Colon cancer Neg Hx   . Esophageal cancer Neg Hx   . Rectal cancer Neg Hx   . Stomach cancer Neg Hx     Health Maintenance  Topic Date Due  . Hepatitis C Screening  Never done  . HIV Screening  Never done  . COVID-19 Vaccine (2 - Booster for [employer] series) 02/27/2020  . INFLUENZA VACCINE  04/27/2021  . MAMMOGRAM  06/04/2021  . PAP SMEAR-Modifier  04/11/2024  . TETANUS/TDAP  03/22/2027  . COLONOSCOPY (Pts 45-48yrs Insurance coverage will need to be confirmed)  05/26/2027  .  HPV VACCINES  Aged Out     ----------------------------------------------------------------------------------------------------------------------------------------------------------------------------------------------------------------- Physical Exam BP 113/77 (BP Location: Left Arm, Patient Position: Sitting, Cuff Size: Normal)   Pulse 75   Ht 5\' 1"  (1.549 m)   Wt 140 lb (63.5 kg)   SpO2 99%   BMI 26.45 kg/m   Physical Exam Constitutional:      Appearance: Normal appearance.  HENT:     Head: Normocephalic and atraumatic.  Eyes:     General: No scleral icterus. Cardiovascular:     Rate and Rhythm: Normal rate and regular rhythm.  Pulmonary:     Effort: Pulmonary effort is normal.     Breath sounds: Normal breath sounds.  Musculoskeletal:     Cervical  back: Neck supple.  Skin:    Comments: Mildly indurated, erythematous patch to R hip.  No bullseye appearance.    Neurological:     General: No focal deficit present.     Mental Status: She is alert.  Psychiatric:        Mood and Affect: Mood normal.        Behavior: Behavior normal.     ------------------------------------------------------------------------------------------------------------------------------------------------------------------------------------------------------------------- Assessment and Plan  Tick bite Tick bite to R hip area.  Tick fairly engorged at time of removal.  She is also having headache.  Will cover for possible tick borne illness with course of doxycycline.  Instructed to contact clinic if developing new or worsening symptoms.    Meds ordered this encounter  Medications  . doxycycline (VIBRA-TABS) 100 MG tablet    Sig: Take 1 tablet (100 mg total) by mouth 2 (two) times daily for 14 days.    Dispense:  28 tablet    Refill:  0    No follow-ups on file.    This visit occurred during the SARS-CoV-2 public health emergency.  Safety protocols were in place, including screening questions prior to the visit, additional usage of staff PPE, and extensive cleaning of exam room while observing appropriate contact time as indicated for disinfecting solutions.

## 2021-01-25 NOTE — Assessment & Plan Note (Signed)
Tick bite to R hip area.  Tick fairly engorged at time of removal.  She is also having headache.  Will cover for possible tick borne illness with course of doxycycline.  Instructed to contact clinic if developing new or worsening symptoms.

## 2021-01-29 ENCOUNTER — Other Ambulatory Visit (HOSPITAL_COMMUNITY): Payer: Self-pay

## 2021-01-29 MED FILL — Citalopram Hydrobromide Tab 20 MG (Base Equiv): ORAL | 90 days supply | Qty: 135 | Fill #0 | Status: AC

## 2021-01-29 MED FILL — Metformin HCl Tab ER 24HR 500 MG: ORAL | 90 days supply | Qty: 90 | Fill #0 | Status: AC

## 2021-01-29 MED FILL — Losartan Potassium & Hydrochlorothiazide Tab 100-12.5 MG: ORAL | 30 days supply | Qty: 30 | Fill #1 | Status: AC

## 2021-03-14 DIAGNOSIS — Z20822 Contact with and (suspected) exposure to covid-19: Secondary | ICD-10-CM | POA: Diagnosis not present

## 2021-03-15 DIAGNOSIS — Z20822 Contact with and (suspected) exposure to covid-19: Secondary | ICD-10-CM | POA: Diagnosis not present

## 2021-03-23 ENCOUNTER — Other Ambulatory Visit: Payer: Self-pay | Admitting: Osteopathic Medicine

## 2021-03-23 ENCOUNTER — Other Ambulatory Visit (HOSPITAL_COMMUNITY): Payer: Self-pay

## 2021-03-23 MED FILL — Gabapentin Cap 300 MG: ORAL | 90 days supply | Qty: 180 | Fill #0 | Status: AC

## 2021-03-23 MED FILL — Icosapent Ethyl Cap 1 GM: ORAL | 90 days supply | Qty: 360 | Fill #1 | Status: AC

## 2021-03-23 MED FILL — Losartan Potassium & Hydrochlorothiazide Tab 100-12.5 MG: ORAL | 30 days supply | Qty: 30 | Fill #2 | Status: AC

## 2021-03-24 ENCOUNTER — Other Ambulatory Visit (HOSPITAL_COMMUNITY): Payer: Self-pay

## 2021-03-24 ENCOUNTER — Other Ambulatory Visit: Payer: Self-pay | Admitting: Osteopathic Medicine

## 2021-03-24 MED ORDER — METFORMIN HCL ER 500 MG PO TB24
500.0000 mg | ORAL_TABLET | Freq: Every day | ORAL | 0 refills | Status: DC
  Filled 2021-03-24 – 2021-06-03 (×2): qty 90, 90d supply, fill #0

## 2021-05-04 MED FILL — Losartan Potassium & Hydrochlorothiazide Tab 100-12.5 MG: ORAL | 30 days supply | Qty: 30 | Fill #3 | Status: AC

## 2021-05-05 ENCOUNTER — Other Ambulatory Visit (HOSPITAL_COMMUNITY): Payer: Self-pay

## 2021-05-05 ENCOUNTER — Other Ambulatory Visit: Payer: Self-pay | Admitting: Osteopathic Medicine

## 2021-05-06 ENCOUNTER — Other Ambulatory Visit (HOSPITAL_COMMUNITY): Payer: Self-pay

## 2021-05-06 ENCOUNTER — Other Ambulatory Visit: Payer: Self-pay | Admitting: Osteopathic Medicine

## 2021-05-06 MED ORDER — CITALOPRAM HYDROBROMIDE 20 MG PO TABS
30.0000 mg | ORAL_TABLET | Freq: Every day | ORAL | 1 refills | Status: DC
  Filled 2021-05-06 – 2021-07-30 (×2): qty 135, 90d supply, fill #0
  Filled 2021-12-23: qty 135, 90d supply, fill #1

## 2021-05-14 ENCOUNTER — Other Ambulatory Visit (HOSPITAL_COMMUNITY): Payer: Self-pay

## 2021-06-03 ENCOUNTER — Other Ambulatory Visit (HOSPITAL_COMMUNITY): Payer: Self-pay

## 2021-06-03 ENCOUNTER — Other Ambulatory Visit: Payer: Self-pay | Admitting: Osteopathic Medicine

## 2021-06-03 MED ORDER — GABAPENTIN 300 MG PO CAPS
600.0000 mg | ORAL_CAPSULE | Freq: Every evening | ORAL | 0 refills | Status: DC | PRN
  Filled 2021-06-03 – 2021-07-08 (×2): qty 180, 90d supply, fill #0

## 2021-06-03 MED FILL — Losartan Potassium & Hydrochlorothiazide Tab 100-12.5 MG: ORAL | 30 days supply | Qty: 30 | Fill #4 | Status: AC

## 2021-07-08 ENCOUNTER — Other Ambulatory Visit (HOSPITAL_COMMUNITY): Payer: Self-pay

## 2021-07-14 ENCOUNTER — Other Ambulatory Visit: Payer: Self-pay | Admitting: Osteopathic Medicine

## 2021-07-14 DIAGNOSIS — Z1231 Encounter for screening mammogram for malignant neoplasm of breast: Secondary | ICD-10-CM

## 2021-07-15 ENCOUNTER — Ambulatory Visit (INDEPENDENT_AMBULATORY_CARE_PROVIDER_SITE_OTHER): Payer: 59

## 2021-07-15 ENCOUNTER — Other Ambulatory Visit: Payer: Self-pay

## 2021-07-15 DIAGNOSIS — Z1231 Encounter for screening mammogram for malignant neoplasm of breast: Secondary | ICD-10-CM

## 2021-07-16 ENCOUNTER — Other Ambulatory Visit (HOSPITAL_COMMUNITY): Payer: Self-pay

## 2021-07-16 MED FILL — Losartan Potassium & Hydrochlorothiazide Tab 100-12.5 MG: ORAL | 30 days supply | Qty: 30 | Fill #5 | Status: CN

## 2021-07-20 NOTE — Progress Notes (Signed)
Please call patient. Normal mammogram.  Repeat in 1 year.  

## 2021-07-24 ENCOUNTER — Other Ambulatory Visit (HOSPITAL_COMMUNITY): Payer: Self-pay

## 2021-07-30 ENCOUNTER — Other Ambulatory Visit: Payer: Self-pay | Admitting: Osteopathic Medicine

## 2021-07-30 ENCOUNTER — Other Ambulatory Visit (HOSPITAL_COMMUNITY): Payer: Self-pay

## 2021-07-30 MED ORDER — AMITRIPTYLINE HCL 50 MG PO TABS
50.0000 mg | ORAL_TABLET | Freq: Every day | ORAL | 0 refills | Status: DC
  Filled 2021-07-30: qty 90, 90d supply, fill #0

## 2021-07-30 MED ORDER — ICOSAPENT ETHYL 1 G PO CAPS
1.0000 g | ORAL_CAPSULE | Freq: Two times a day (BID) | ORAL | 0 refills | Status: DC
  Filled 2021-07-30: qty 180, 90d supply, fill #0

## 2021-07-30 MED FILL — Losartan Potassium & Hydrochlorothiazide Tab 100-12.5 MG: ORAL | 30 days supply | Qty: 30 | Fill #5 | Status: AC

## 2021-07-31 ENCOUNTER — Other Ambulatory Visit: Payer: Self-pay | Admitting: Family Medicine

## 2021-07-31 ENCOUNTER — Other Ambulatory Visit (HOSPITAL_COMMUNITY): Payer: Self-pay

## 2021-07-31 DIAGNOSIS — E785 Hyperlipidemia, unspecified: Secondary | ICD-10-CM

## 2021-07-31 MED ORDER — ICOSAPENT ETHYL 1 G PO CAPS
2.0000 g | ORAL_CAPSULE | Freq: Two times a day (BID) | ORAL | 1 refills | Status: DC
  Filled 2021-07-31 – 2021-09-03 (×2): qty 360, 90d supply, fill #0
  Filled 2021-12-23: qty 360, 90d supply, fill #1

## 2021-07-31 NOTE — Progress Notes (Signed)
Per pharmacist request, Vascepa dose increased to 2g BID daily  Micha Dosanjh B. Reola Calkins, DNP, FNP-C

## 2021-08-06 NOTE — Progress Notes (Signed)
Please call patient. Normal mammogram.  Repeat in 1 year.  

## 2021-08-10 ENCOUNTER — Other Ambulatory Visit (HOSPITAL_COMMUNITY): Payer: Self-pay

## 2021-08-27 ENCOUNTER — Ambulatory Visit: Payer: 59 | Admitting: Family Medicine

## 2021-08-31 ENCOUNTER — Other Ambulatory Visit: Payer: Self-pay | Admitting: Osteopathic Medicine

## 2021-08-31 MED FILL — Losartan Potassium & Hydrochlorothiazide Tab 100-12.5 MG: ORAL | 30 days supply | Qty: 30 | Fill #6 | Status: AC

## 2021-09-01 ENCOUNTER — Other Ambulatory Visit (HOSPITAL_COMMUNITY): Payer: Self-pay

## 2021-09-01 MED ORDER — METFORMIN HCL ER 500 MG PO TB24
500.0000 mg | ORAL_TABLET | Freq: Every day | ORAL | 0 refills | Status: DC
  Filled 2021-09-01: qty 30, 30d supply, fill #0

## 2021-09-02 ENCOUNTER — Other Ambulatory Visit (HOSPITAL_COMMUNITY): Payer: Self-pay

## 2021-09-02 MED ORDER — XIIDRA 5 % OP SOLN
1.0000 [drp] | Freq: Two times a day (BID) | OPHTHALMIC | 3 refills | Status: DC
  Filled 2021-09-02: qty 180, 90d supply, fill #0
  Filled 2022-06-21: qty 180, 90d supply, fill #1

## 2021-09-03 ENCOUNTER — Other Ambulatory Visit (HOSPITAL_COMMUNITY): Payer: Self-pay

## 2021-09-08 IMAGING — DX DG CHEST 1V PORT
1 series · 1 of 1 positions shown · non-contrast
Comparison: 12/25/2018

CLINICAL DATA: Left-sided chest pain and shortness of breath,
dizziness for 5 days

EXAM:
PORTABLE CHEST 1 VIEW

[chest ap]
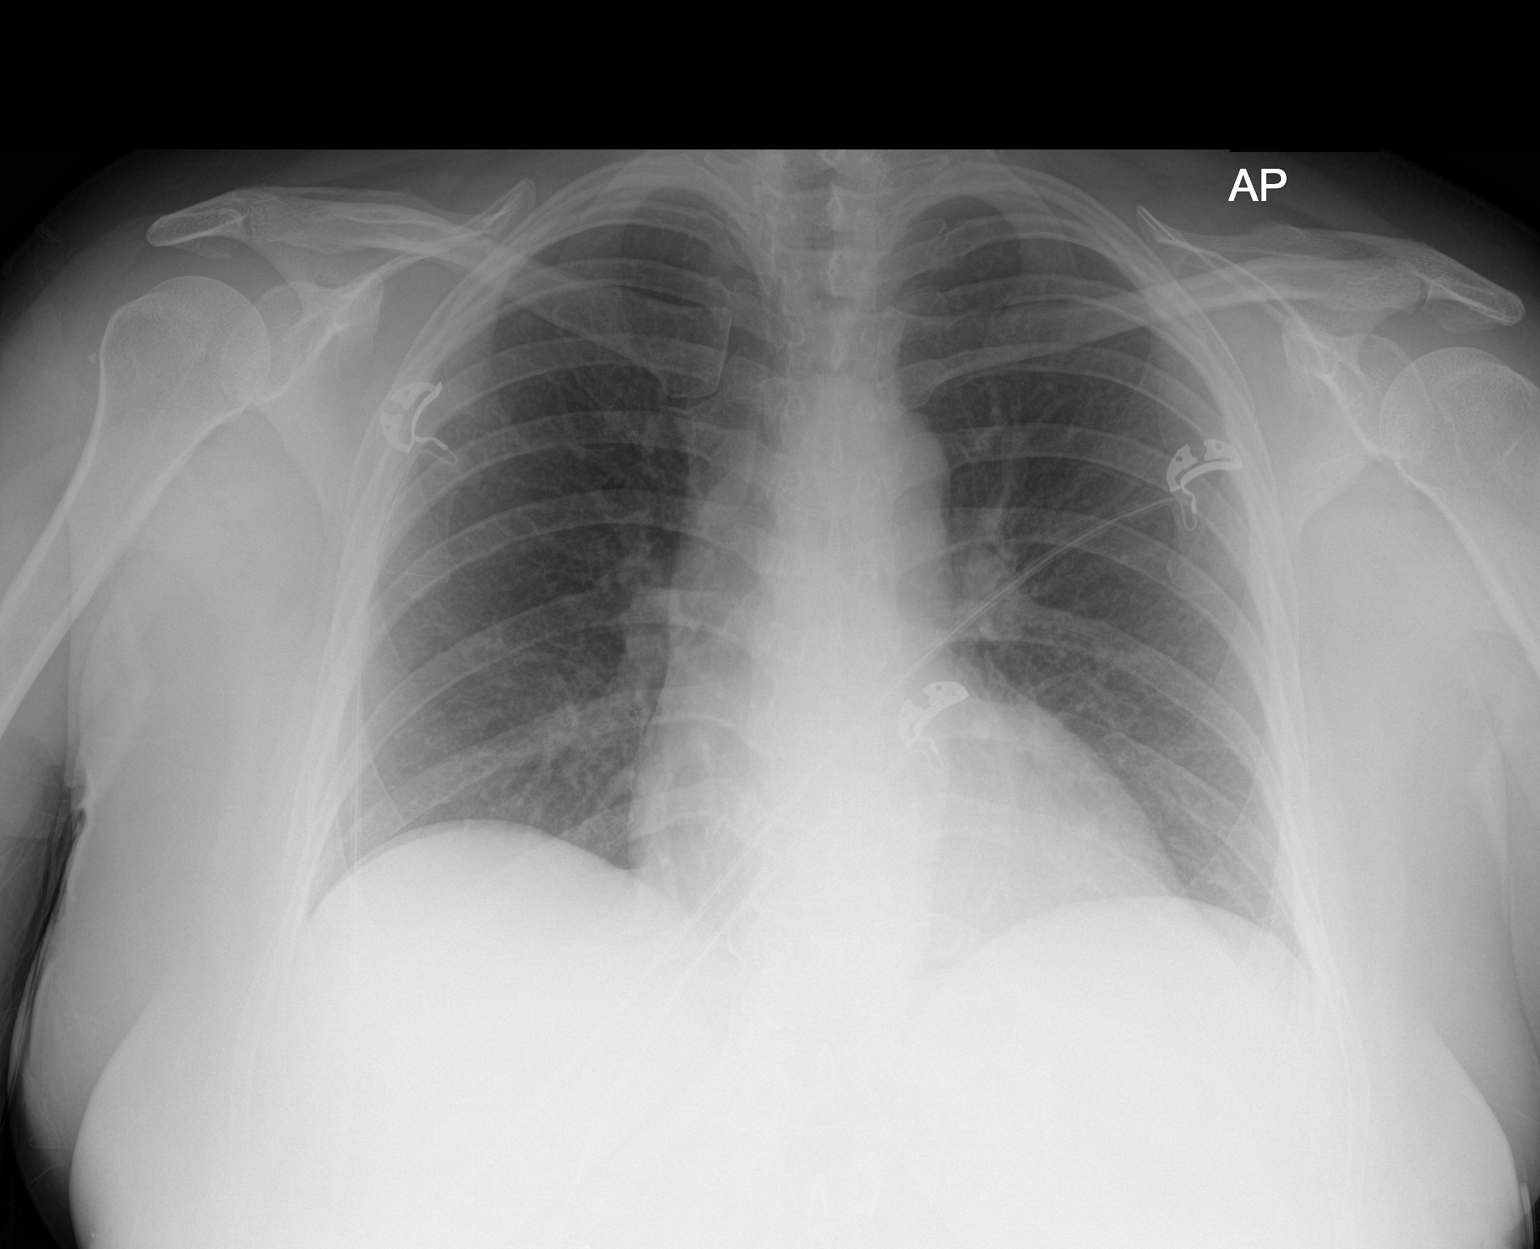

[1 of 1 positions shown; findings below may reference images not displayed]

FINDINGS: The heart size and mediastinal contours are within normal limits.
Both lungs are clear. The visualized skeletal structures are
unremarkable.
IMPRESSION: No active disease.

## 2021-09-22 ENCOUNTER — Encounter: Payer: Self-pay | Admitting: Family Medicine

## 2021-09-22 ENCOUNTER — Other Ambulatory Visit (HOSPITAL_COMMUNITY): Payer: Self-pay

## 2021-09-22 ENCOUNTER — Ambulatory Visit (INDEPENDENT_AMBULATORY_CARE_PROVIDER_SITE_OTHER): Payer: 59 | Admitting: Family Medicine

## 2021-09-22 ENCOUNTER — Other Ambulatory Visit: Payer: Self-pay

## 2021-09-22 VITALS — BP 138/83 | HR 107 | Ht 61.0 in | Wt 144.0 lb

## 2021-09-22 DIAGNOSIS — F418 Other specified anxiety disorders: Secondary | ICD-10-CM | POA: Diagnosis not present

## 2021-09-22 DIAGNOSIS — I10 Essential (primary) hypertension: Secondary | ICD-10-CM | POA: Diagnosis not present

## 2021-09-22 DIAGNOSIS — E785 Hyperlipidemia, unspecified: Secondary | ICD-10-CM

## 2021-09-22 DIAGNOSIS — Z8639 Personal history of other endocrine, nutritional and metabolic disease: Secondary | ICD-10-CM | POA: Diagnosis not present

## 2021-09-22 DIAGNOSIS — F5105 Insomnia due to other mental disorder: Secondary | ICD-10-CM | POA: Diagnosis not present

## 2021-09-22 DIAGNOSIS — R7303 Prediabetes: Secondary | ICD-10-CM

## 2021-09-22 DIAGNOSIS — Z0000 Encounter for general adult medical examination without abnormal findings: Secondary | ICD-10-CM

## 2021-09-22 MED ORDER — GABAPENTIN 300 MG PO CAPS
600.0000 mg | ORAL_CAPSULE | Freq: Every evening | ORAL | 3 refills | Status: DC | PRN
  Filled 2021-09-22 – 2022-03-08 (×2): qty 180, 90d supply, fill #0
  Filled 2022-09-15: qty 180, 90d supply, fill #1

## 2021-09-22 MED ORDER — METFORMIN HCL ER 500 MG PO TB24
500.0000 mg | ORAL_TABLET | Freq: Every day | ORAL | 1 refills | Status: DC
  Filled 2021-09-22 – 2021-10-14 (×2): qty 90, 90d supply, fill #0
  Filled 2022-01-21 – 2022-02-03 (×2): qty 90, 90d supply, fill #1

## 2021-09-22 MED ORDER — AMITRIPTYLINE HCL 50 MG PO TABS
50.0000 mg | ORAL_TABLET | Freq: Every day | ORAL | 3 refills | Status: DC
  Filled 2021-09-22 – 2022-03-08 (×2): qty 90, 90d supply, fill #0
  Filled 2022-08-12: qty 90, 90d supply, fill #1

## 2021-09-27 ENCOUNTER — Encounter: Payer: Self-pay | Admitting: Family Medicine

## 2021-09-27 DIAGNOSIS — I10 Essential (primary) hypertension: Secondary | ICD-10-CM | POA: Insufficient documentation

## 2021-09-27 DIAGNOSIS — F418 Other specified anxiety disorders: Secondary | ICD-10-CM | POA: Insufficient documentation

## 2021-09-27 NOTE — Assessment & Plan Note (Signed)
blood pressure remains well controlled with combination of losartan and hydrochlorothiazide.  Updating labs today.

## 2021-09-27 NOTE — Assessment & Plan Note (Signed)
History of statin intolerance.  Doing well with Vascepa at this time.  Updating lipid profile.

## 2021-09-27 NOTE — Assessment & Plan Note (Signed)
Continue Elavil for management of insomnia.  Continue citalopram for depressive and anxiety symptoms.

## 2021-09-27 NOTE — Assessment & Plan Note (Signed)
Tolerating metformin well.  Update A1c today. 

## 2021-09-27 NOTE — Progress Notes (Signed)
BLAKLEE SHORES - 56 y.o. female MRN [id number]  Date of birth: Aug 26, 1966  Subjective No chief complaint on file.   HPI Kristine Stevens is a 56 year old female here today for follow-up visit.  She is transferring care from Dr. Lyn Hollingshead.  She has history of hyperlipidemia, prediabetes, depression and anxiety.  She is status post sleeve gastrectomy and has remained on losartan with hydrochlorothiazide for hypertension and metformin for prediabetes.  She is continue to exercise fairly regularly.  She has been intolerant to statins for management of her hyperlipidemia.  She is doing well on Vascepa at this time.  History of multiple personal tragedies including loss of 3 of her sons.  Symptoms remain fairly well managed with citalopram.  She is using Elavil at night for insomnia.  ROS:  A comprehensive ROS was completed and negative except as noted per HPI  No Known Allergies  Past Medical History:  Diagnosis Date   GERD (gastroesophageal reflux disease)    Heartburn    Hyperlipidemia    Hypertension    IBS (irritable bowel syndrome)    PONV (postoperative nausea and vomiting)    severe   Prediabetes    Sphincter of Oddi dysfunction     Past Surgical History:  Procedure Laterality Date   ABDOMINOPLASTY  05/25/2012   CESAREAN SECTION  1991, 1998   x2   CHOLECYSTECTOMY  1995   IRRIGATION AND DEBRIDEMENT ABSCESS Right 05/22/2013   Procedure: MINOR INCISION AND DRAINAGE OF ABSCESS;  Surgeon: Nicki Reaper, MD;  Location: [facility];  Service: Orthopedics;  Laterality: Right;   KNEE ARTHROSCOPY Left    x3   LAPAROSCOPIC GASTRIC SLEEVE RESECTION N/A 03/25/2015   Procedure: LAPAROSCOPIC GASTRIC SLEEVE RESECTION UPPER ENDOSCOPY;  Surgeon: Luretha Murphy, MD;  Location: [facility];  Service: General;  Laterality: N/A;   VAGINAL BIRTH AFTER CESAREAN SECTION  1993, 2001   x2    Social History   Socioeconomic History   Marital status: Married    Spouse name: Not on file   Number of  children: Not on file   Years of education: Not on file   Highest education level: Not on file  Occupational History   Not on file  Tobacco Use   Smoking status: Never   Smokeless tobacco: Never  Substance and Sexual Activity   Alcohol use: Yes    Comment: rare   Drug use: No   Sexual activity: Not on file  Other Topics Concern   Not on file  Social History Narrative   Not on file   Social Determinants of Health   Financial Resource Strain: Not on file  Food Insecurity: Not on file  Transportation Needs: Not on file  Physical Activity: Not on file  Stress: Not on file  Social Connections: Not on file    Family History  Problem Relation Age of Onset   Hypertension Mother    Hyperlipidemia Mother    Fibromyalgia Mother    Hypertension Father    Breast cancer Maternal Grandmother 60   Breast cancer Cousin 20   Colon cancer Neg Hx    Esophageal cancer Neg Hx    Rectal cancer Neg Hx    Stomach cancer Neg Hx     Health Maintenance  Topic Date Due   HIV Screening  Never done   Hepatitis C Screening  Never done   COVID-19 Vaccine (2 - Booster for Janssen series) 02/27/2020   INFLUENZA VACCINE  04/27/2021   MAMMOGRAM  07/15/2022  PAP SMEAR-Modifier  04/11/2024   TETANUS/TDAP  03/22/2027   COLONOSCOPY (Pts 45-76yrs Insurance coverage will need to be confirmed)  05/26/2027   Zoster Vaccines- Shingrix  Completed   Pneumococcal Vaccine 48-27 Years old  Aged Out   HPV VACCINES  Aged Out     ----------------------------------------------------------------------------------------------------------------------------------------------------------------------------------------------------------------- Physical Exam BP 138/83    Pulse (!) 107    Ht 5\' 1"  (1.549 m)    Wt 144 lb (65.3 kg)    SpO2 98%    BMI 27.21 kg/m   Physical Exam Constitutional:      Appearance: Normal appearance.  HENT:     Head: Normocephalic and atraumatic.  Eyes:     General: No scleral  icterus. Cardiovascular:     Rate and Rhythm: Normal rate and regular rhythm.  Pulmonary:     Effort: Pulmonary effort is normal.     Breath sounds: Normal breath sounds.  Musculoskeletal:     Cervical back: Neck supple.  Neurological:     General: No focal deficit present.     Mental Status: She is alert.  Psychiatric:        Mood and Affect: Mood normal.        Behavior: Behavior normal.    ------------------------------------------------------------------------------------------------------------------------------------------------------------------------------------------------------------------- Assessment and Plan  Hyperlipemia History of statin intolerance.  Doing well with Vascepa at this time.  Updating lipid profile.  Prediabetes Tolerating metformin well.  Update A1c today.  Essential hypertension blood pressure remains well controlled with combination of losartan and hydrochlorothiazide.  Updating labs today.  Insomnia secondary to depression with anxiety Continue Elavil for management of insomnia.  Continue citalopram for depressive and anxiety symptoms.   Meds ordered this encounter  Medications   metFORMIN (GLUCOPHAGE-XR) 500 MG 24 hr tablet    Sig: Take 1 tablet (500 mg total) by mouth daily with breakfast.    Dispense:  90 tablet    Refill:  1   gabapentin (NEURONTIN) 300 MG capsule    Sig: Take 2 capsules (600 mg total) by mouth at bedtime as needed.    Dispense:  180 capsule    Refill:  3   amitriptyline (ELAVIL) 50 MG tablet    Sig: Take 1 tablet (50 mg total) by mouth at bedtime.    Dispense:  90 tablet    Refill:  3    Return in about 6 months (around 03/23/2022) for HLD.    This visit occurred during the SARS-CoV-2 public health emergency.  Safety protocols were in place, including screening questions prior to the visit, additional usage of staff PPE, and extensive cleaning of exam room while observing appropriate contact time as indicated for  disinfecting solutions.

## 2021-09-30 LAB — LIPID PANEL
Cholesterol: 246 — AB (ref 0–200)
HDL: 82 — AB (ref 35–70)
LDL Cholesterol: 154
LDl/HDL Ratio: 3
Triglycerides: 63 (ref 40–160)

## 2021-09-30 LAB — HEPATIC FUNCTION PANEL
AST: 22 (ref 13–35)
Alkaline Phosphatase: 74 (ref 25–125)

## 2021-09-30 LAB — CBC AND DIFFERENTIAL
HCT: 36 (ref 36–46)
Hemoglobin: 11.7 — AB (ref 12.0–16.0)
Platelets: 367 10*3/uL (ref 150–400)
WBC: 5

## 2021-09-30 LAB — TSH: TSH: 1.22 (ref 0.41–5.90)

## 2021-09-30 LAB — HEMOGLOBIN A1C: Hemoglobin A1C: 6.1

## 2021-09-30 LAB — COMPREHENSIVE METABOLIC PANEL
Albumin: 4.6 (ref 3.5–5.0)
Calcium: 9.9 (ref 8.7–10.7)
Globulin: 2.5
eGFR: 84

## 2021-09-30 LAB — BASIC METABOLIC PANEL
BUN: 10 (ref 4–21)
Chloride: 102 (ref 99–108)
Creatinine: 0.8 (ref 0.5–1.1)
Glucose: 93
Potassium: 4 mEq/L (ref 3.5–5.1)
Sodium: 141 (ref 137–147)

## 2021-09-30 LAB — CBC: RBC: 4.59 (ref 3.87–5.11)

## 2021-10-14 ENCOUNTER — Other Ambulatory Visit (HOSPITAL_COMMUNITY): Payer: Self-pay

## 2021-10-14 MED FILL — Losartan Potassium & Hydrochlorothiazide Tab 100-12.5 MG: ORAL | 30 days supply | Qty: 30 | Fill #7 | Status: AC

## 2021-11-19 ENCOUNTER — Telehealth: Payer: 59 | Admitting: Nurse Practitioner

## 2021-11-19 DIAGNOSIS — N3000 Acute cystitis without hematuria: Secondary | ICD-10-CM | POA: Diagnosis not present

## 2021-11-19 MED ORDER — CEPHALEXIN 500 MG PO CAPS: 500.0000 mg | ORAL_CAPSULE | Freq: Two times a day (BID) | ORAL | 0 refills | Status: DC

## 2021-11-19 MED ORDER — FLUCONAZOLE 150 MG PO TABS: 150.0000 mg | ORAL_TABLET | Freq: Once | ORAL | 0 refills | Status: AC

## 2021-11-19 MED ORDER — PHENAZOPYRIDINE HCL 100 MG PO TABS: 100.0000 mg | ORAL_TABLET | Freq: Three times a day (TID) | ORAL | 0 refills | Status: DC | PRN

## 2021-11-19 NOTE — Progress Notes (Signed)
Virtual Visit Consent   ROSAMARIA ZAKARIAN, you are scheduled for a virtual visit with Mary-Margaret Hassell Done, [place], a [facility] provider, today.     Just as with appointments in the office, your consent must be obtained to participate.  Your consent will be active for this visit and any virtual visit you may have with one of our providers in the next 365 days.     If you have a MyChart account, a copy of this consent can be sent to you electronically.  All virtual visits are billed to your insurance company just like a traditional visit in the office.    As this is a virtual visit, video technology does not allow for your provider to perform a traditional examination.  This may limit your provider's ability to fully assess your condition.  If your provider identifies any concerns that need to be evaluated in person or the need to arrange testing (such as labs, EKG, etc.), we will make arrangements to do so.     Although advances in technology are sophisticated, we cannot ensure that it will always work on either your end or our end.  If the connection with a video visit is poor, the visit may have to be switched to a telephone visit.  With either a video or telephone visit, we are not always able to ensure that we have a secure connection.     I need to obtain your verbal consent now.   Are you willing to proceed with your visit today? YES   LACYE WEICHMAN has provided verbal consent on 11/19/2021 for a virtual visit (video or telephone).   Mary-Margaret Hassell Done, FNP   Date: 11/19/2021 11:20 AM   Virtual Visit via Video Note   I, Mary-Margaret Hassell Done, connected with KANASHA DESSOURCES ([id number], [id number]) on 11/19/21 at 11:15 AM EST by a video-enabled telemedicine application and verified that I am speaking with the correct person using two identifiers.  Location: Patient: Virtual Visit Location Patient: Home Provider: Virtual Visit Location Provider: Mobile   I discussed the limitations of  evaluation and management by telemedicine and the availability of in person appointments. The patient expressed understanding and agreed to proceed.    History of Present Illness: NOEMY PELLERIN is a 56 y.o. who identifies as a female who was assigned female at birth, and is being seen today for uti .  HPI: Urinary Tract Infection  This is a new problem. The current episode started in the past 7 days (Tuesday). The problem occurs every urination. The problem has been gradually worsening. The quality of the pain is described as burning. The pain is at a severity of 3/10. The pain is mild. There has been no fever. She is Not sexually active. There is No history of pyelonephritis. Associated symptoms include frequency and urgency. Pertinent negatives include no discharge. Associated symptoms comments: No back pain. She has tried nothing for the symptoms. The treatment provided no relief.   Review of Systems  Genitourinary:  Positive for frequency and urgency.   Problems:  Patient Active Problem List   Diagnosis Date Noted   Essential hypertension 09/27/2021   Insomnia secondary to depression with anxiety 09/27/2021   Tick bite 01/25/2021   Statin intolerance 04/12/2019   Sicca syndrome, unspecified 02/05/2019   Elevated transaminase measurement 11/02/2017   History of high cholesterol 05/27/2016   History of gestational diabetes 05/27/2016   History of gestational hypertension 05/27/2016   History of acute pancreatitis 05/27/2016  Prediabetes 04/17/2015   Status post laparoscopic sleeve gastrectomy June 2016 03/25/2015   Hyperlipemia 01/02/2015   Dysfunction of sphincter of Oddi 08/16/2013    Allergies: No Known Allergies Medications:  Current Outpatient Medications:    amitriptyline (ELAVIL) 50 MG tablet, Take 1 tablet (50 mg total) by mouth at bedtime., Disp: 90 tablet, Rfl: 3   aspirin 81 MG tablet, Take 81 mg by mouth daily., Disp: , Rfl:    citalopram (CELEXA) 20 MG tablet, Take  1.5 tablets (30 mg total) by mouth daily., Disp: 135 tablet, Rfl: 1   gabapentin (NEURONTIN) 300 MG capsule, Take 2 capsules (600 mg total) by mouth at bedtime as needed., Disp: 180 capsule, Rfl: 3   icosapent Ethyl (VASCEPA) 1 g capsule, Take 2 capsules (2 g total) by mouth 2 (two) times daily., Disp: 360 capsule, Rfl: 1   losartan-hydrochlorothiazide (HYZAAR) 100-12.5 MG tablet, TAKE 1 TABLET BY MOUTH DAILY., Disp: 90 tablet, Rfl: 3   metFORMIN (GLUCOPHAGE-XR) 500 MG 24 hr tablet, Take 1 tablet (500 mg total) by mouth daily with breakfast., Disp: 90 tablet, Rfl: 1   Multiple Vitamins-Minerals (MULTIVITAMIN ADULT PO), Take by mouth daily., Disp: , Rfl:    XIIDRA 5 % SOLN, Instill 1 drop into both eyes twice a day, Disp: 180 each, Rfl: 3  Observations/Objective: Patient is well-developed, well-nourished in no acute distress.  Resting comfortably  at home.  Head is normocephalic, atraumatic.  No labored breathing.  Speech is clear and coherent with logical content.  Patient is alert and oriented at baseline.    Assessment and Plan:  ASMITA DEMARCHI in today with chief complaint of Urinary Tract Infection   1. Acute cystitis without hematuria Take medication as prescribe Cotton underwear Take shower not bath Cranberry juice, yogurt Force fluids AZO over the counter X2 days RTO prn  Meds ordered this encounter  Medications   cephALEXin (KEFLEX) 500 MG capsule    Sig: Take 1 capsule (500 mg total) by mouth 2 (two) times daily.    Dispense:  14 capsule    Refill:  0    Order Specific Question:   Supervising Provider    Answer:   Sabra Heck, BRIAN [[id number]]   phenazopyridine (PYRIDIUM) 100 MG tablet    Sig: Take 1 tablet (100 mg total) by mouth 3 (three) times daily as needed for pain.    Dispense:  10 tablet    Refill:  0    Order Specific Question:   Supervising Provider    Answer:   Sabra Heck, BRIAN [[id number]]   fluconazole (DIFLUCAN) 150 MG tablet    Sig: Take 1 tablet (150 mg total) by  mouth once for 1 dose.    Dispense:  1 tablet    Refill:  0    Order Specific Question:   Supervising Provider    Answer:   Noemi Chapel [[id number]]     Follow Up Instructions: I discussed the assessment and treatment plan with the patient. The patient was provided an opportunity to ask questions and all were answered. The patient agreed with the plan and demonstrated an understanding of the instructions.  A copy of instructions were sent to the patient via MyChart.  The patient was advised to call back or seek an in-person evaluation if the symptoms worsen or if the condition fails to improve as anticipated.  Time:  I spent 8 minutes with the patient via telehealth technology discussing the above problems/concerns.    Mary-Margaret Hassell Done, FNP

## 2021-11-19 NOTE — Patient Instructions (Signed)

## 2021-12-04 ENCOUNTER — Other Ambulatory Visit: Payer: Self-pay | Admitting: Family Medicine

## 2021-12-04 ENCOUNTER — Other Ambulatory Visit (HOSPITAL_COMMUNITY): Payer: Self-pay

## 2021-12-04 MED ORDER — LOSARTAN POTASSIUM-HCTZ 100-12.5 MG PO TABS
1.0000 | ORAL_TABLET | Freq: Every day | ORAL | 3 refills | Status: DC
Start: 2021-12-04 — End: 2022-12-10
  Filled 2021-12-04: qty 90, 90d supply, fill #0
  Filled 2022-03-08: qty 90, 90d supply, fill #1
  Filled 2022-06-03: qty 90, 90d supply, fill #2
  Filled 2022-08-30 – 2022-09-15 (×2): qty 90, 90d supply, fill #3

## 2021-12-08 ENCOUNTER — Other Ambulatory Visit (HOSPITAL_COMMUNITY): Payer: Self-pay

## 2021-12-24 ENCOUNTER — Other Ambulatory Visit (HOSPITAL_COMMUNITY): Payer: Self-pay

## 2022-01-22 ENCOUNTER — Other Ambulatory Visit (HOSPITAL_COMMUNITY): Payer: Self-pay

## 2022-01-29 ENCOUNTER — Ambulatory Visit: Payer: 59 | Admitting: Family Medicine

## 2022-02-02 ENCOUNTER — Other Ambulatory Visit (HOSPITAL_COMMUNITY): Payer: Self-pay

## 2022-02-02 ENCOUNTER — Encounter: Payer: Self-pay | Admitting: Family Medicine

## 2022-02-03 ENCOUNTER — Other Ambulatory Visit (HOSPITAL_COMMUNITY): Payer: Self-pay

## 2022-02-04 ENCOUNTER — Other Ambulatory Visit (HOSPITAL_COMMUNITY): Payer: Self-pay

## 2022-02-04 ENCOUNTER — Encounter: Payer: Self-pay | Admitting: Family Medicine

## 2022-02-04 ENCOUNTER — Ambulatory Visit: Payer: 59 | Admitting: Family Medicine

## 2022-02-04 VITALS — BP 144/102 | HR 75 | Ht 61.0 in | Wt 130.0 lb

## 2022-02-04 DIAGNOSIS — R7303 Prediabetes: Secondary | ICD-10-CM | POA: Diagnosis not present

## 2022-02-04 DIAGNOSIS — I10 Essential (primary) hypertension: Secondary | ICD-10-CM

## 2022-02-04 DIAGNOSIS — Z0000 Encounter for general adult medical examination without abnormal findings: Secondary | ICD-10-CM | POA: Diagnosis not present

## 2022-02-04 DIAGNOSIS — L719 Rosacea, unspecified: Secondary | ICD-10-CM | POA: Diagnosis not present

## 2022-02-04 DIAGNOSIS — Z719 Counseling, unspecified: Secondary | ICD-10-CM | POA: Insufficient documentation

## 2022-02-04 MED ORDER — METFORMIN HCL ER 500 MG PO TB24
1000.0000 mg | ORAL_TABLET | Freq: Every day | ORAL | 3 refills | Status: DC
  Filled 2022-02-04 – 2022-03-17 (×4): qty 180, 90d supply, fill #0
  Filled 2022-06-21: qty 180, 90d supply, fill #1
  Filled 2022-09-15: qty 180, 90d supply, fill #2
  Filled 2023-01-19: qty 180, 90d supply, fill #3

## 2022-02-04 MED ORDER — AZELAIC ACID 15 % EX FOAM
CUTANEOUS | 1 refills | Status: DC
  Filled 2022-02-04: qty 50, 30d supply, fill #0
  Filled 2022-03-08: qty 50, 30d supply, fill #1

## 2022-02-04 MED ORDER — DOXYCYCLINE HYCLATE 100 MG PO TABS
100.0000 mg | ORAL_TABLET | Freq: Two times a day (BID) | ORAL | 0 refills | Status: AC
  Filled 2022-02-04: qty 20, 10d supply, fill #0

## 2022-02-04 NOTE — Addendum Note (Signed)
Addended by: Perlie Mayo on: 02/04/2022 02:54 PM ? ? Modules accepted: Level of Service ? ?

## 2022-02-04 NOTE — Assessment & Plan Note (Signed)
A1c is increased.  Increasing metformin to 1000mg  daily.  ?

## 2022-02-04 NOTE — Assessment & Plan Note (Signed)
BP elevated in clinic today.  Recommend continuation of current dose of medication and check at home as well. Has noted some orthostasis when squatting, discussed precautions with this . ? ?.   ?

## 2022-02-04 NOTE — Patient Instructions (Signed)
Start doxycycline bid x 10 days.  ?Start azelex foam twice daily. Let me know if no improvement after 10-12 weeks.  ?Increase metformin to 1000mg  daily.  ?

## 2022-02-04 NOTE — Assessment & Plan Note (Addendum)
Erythematous patches and papules consistent with rosacea.  Has tried metronidazole.  Discussed adding azelaic acid topical foam.  Additionally we'll add po doxycycline x10 days to help clear up current flare up.    ?

## 2022-02-04 NOTE — Progress Notes (Addendum)
?Kristine Stevens - 56 y.o. female MRN [id number]  Date of birth: [birth year] ? ?Subjective ?No chief complaint on file. ? ? ?HPI ?Kristine Stevens is a 56 y.o. female here today for annual exam and with complaint of facial rash. She has noted erythematous patches and papules.  She has had this for for quite some time.  She has had some cracking and bleeding at times. She has noted improvement of this when she has taken antibiotics for other ailments.  No changes to skin care products or make up.  She has tried metronidazole gel without significant improvement.   ? ?She does bring in a copy of her recent labs as well.  Her a1c is up some ot 6.1%.  She continues on metformin 500mg  daily.   ? ?She does continue to stay active and watch her diet.  ? ? ?ROS:  A comprehensive ROS was completed and negative except as noted per HPI ? ?No Known Allergies ? ?Past Medical History:  ?Diagnosis Date  ? GERD (gastroesophageal reflux disease)   ? Heartburn   ? Hyperlipidemia   ? Hypertension   ? IBS (irritable bowel syndrome)   ? PONV (postoperative nausea and vomiting)   ? severe  ? Prediabetes   ? Sphincter of Oddi dysfunction   ? ? ?Past Surgical History:  ?Procedure Laterality Date  ? ABDOMINOPLASTY  05/25/2012  ? CESAREAN SECTION  1991, 1998  ? x2  ? CHOLECYSTECTOMY  1995  ? IRRIGATION AND DEBRIDEMENT ABSCESS Right 05/22/2013  ? Procedure: MINOR INCISION AND DRAINAGE OF ABSCESS;  Surgeon: 05/24/2013, MD;  Location: [facility];  Service: Orthopedics;  Laterality: Right;  ? KNEE ARTHROSCOPY Left   ? x3  ? LAPAROSCOPIC GASTRIC SLEEVE RESECTION N/A 03/25/2015  ? Procedure: LAPAROSCOPIC GASTRIC SLEEVE RESECTION UPPER ENDOSCOPY;  Surgeon: 03/27/2015, MD;  Location: [facility];  Service: General;  Laterality: N/A;  ? VAGINAL BIRTH AFTER CESAREAN SECTION  1993, 2001  ? x2  ? ? ?Social History  ? ?Socioeconomic History  ? Marital status: Married  ?  Spouse name: Not on file  ? Number of children: Not on file  ? Years of  education: Not on file  ? Highest education level: Not on file  ?Occupational History  ? Not on file  ?Tobacco Use  ? Smoking status: Never  ? Smokeless tobacco: Never  ?Substance and Sexual Activity  ? Alcohol use: Yes  ?  Comment: rare  ? Drug use: No  ? Sexual activity: Not on file  ?Other Topics Concern  ? Not on file  ?Social History Narrative  ? Not on file  ? ?Social Determinants of Health  ? ?Financial Resource Strain: Not on file  ?Food Insecurity: Not on file  ?Transportation Needs: Not on file  ?Physical Activity: Not on file  ?Stress: Not on file  ?Social Connections: Not on file  ? ? ?Family History  ?Problem Relation Age of Onset  ? Hypertension Mother   ? Hyperlipidemia Mother   ? Fibromyalgia Mother   ? Hypertension Father   ? Breast cancer Maternal Grandmother 40  ? Breast cancer Cousin 45  ? Colon cancer Neg Hx   ? Esophageal cancer Neg Hx   ? Rectal cancer Neg Hx   ? Stomach cancer Neg Hx   ? ? ?Health Maintenance  ?Topic Date Due  ? HIV Screening  Never done  ? Hepatitis C Screening  Never done  ? COVID-19 Vaccine (2 - Booster  for Janssen series) 02/27/2020  ? INFLUENZA VACCINE  04/27/2022  ? MAMMOGRAM  07/15/2022  ? PAP SMEAR-Modifier  04/11/2024  ? TETANUS/TDAP  03/22/2027  ? COLONOSCOPY (Pts 45-78yrs Insurance coverage will need to be confirmed)  05/26/2027  ? Zoster Vaccines- Shingrix  Completed  ? HPV VACCINES  Aged Out  ? ? ? ?----------------------------------------------------------------------------------------------------------------------------------------------------------------------------------------------------------------- ?Physical Exam ?BP (!) 144/102 (BP Location: Left Arm, Patient Position: Sitting, Cuff Size: Normal)   Pulse 75   Ht 5\' 1"  (1.549 m)   Wt 130 lb (59 kg)   SpO2 99%   BMI 24.56 kg/m?  ? ?Physical Exam ?Constitutional:   ?   Appearance: Normal appearance.  ?Eyes:  ?   General: No scleral icterus. ?Musculoskeletal:  ?   Cervical back: Neck supple.   ?Neurological:  ?   General: No focal deficit present.  ?   Mental Status: She is alert.  ?Psychiatric:     ?   Mood and Affect: Mood normal.     ?   Behavior: Behavior normal.  ? ? ?------------------------------------------------------------------------------------------------------------------------------------------------------------------------------------------------------------------- ?Assessment and Plan ? ?Rosacea ?Erythematous patches and papules consistent with rosacea.  Has tried metronidazole.  Discussed adding azelaic acid topical foam.  Additionally we'll add po doxycycline x10 days to help clear up current flare up.    ? ?Essential hypertension ?BP elevated in clinic today.  Recommend continuation of current dose of medication and check at home as well. Has noted some orthostasis when squatting, discussed precautions with this . ? ?.   ? ?Prediabetes ?A1c is increased.  Increasing metformin to 1000mg  daily.  ? ?Well adult exam ?Well adult ?Recent labs reviewed.  ?Immunizations: UTD ?Screenings: UTD ?Anticipatory guidance/Risk factor reduction:  Continue current healthy lifestyle practices.  ? ? ?No orders of the defined types were placed in this encounter. ? ? ?No follow-ups on file. ? ? ? ?This visit occurred during the SARS-CoV-2 public health emergency.  Safety protocols were in place, including screening questions prior to the visit, additional usage of staff PPE, and extensive cleaning of exam room while observing appropriate contact time as indicated for disinfecting solutions.  ? ?

## 2022-02-04 NOTE — Assessment & Plan Note (Signed)
Well adult ?Recent labs reviewed.  ?Immunizations: UTD ?Screenings: UTD ?Anticipatory guidance/Risk factor reduction:  Continue current healthy lifestyle practices.  ?

## 2022-02-08 ENCOUNTER — Encounter: Payer: Self-pay | Admitting: Family Medicine

## 2022-02-10 LAB — OTHER BODY FLUID CHEMISTRY: LDH: 191

## 2022-02-10 LAB — URIC ACID: Uric Acid: 3.6

## 2022-02-10 LAB — T4, FREE: T4,Free (Direct): 5.6

## 2022-02-10 LAB — PHOSPHORUS: Phosphorus: 4.8

## 2022-02-10 LAB — PROTEIN, TOTAL: Total Protein: 7.1 (ref 6.4–8.2)

## 2022-02-10 LAB — IRON: Iron: 62

## 2022-02-10 LAB — GAMMA GT: GGT: 8

## 2022-03-08 ENCOUNTER — Other Ambulatory Visit: Payer: Self-pay | Admitting: Osteopathic Medicine

## 2022-03-08 DIAGNOSIS — F4321 Adjustment disorder with depressed mood: Secondary | ICD-10-CM

## 2022-03-09 ENCOUNTER — Other Ambulatory Visit (HOSPITAL_COMMUNITY): Payer: Self-pay

## 2022-03-09 MED ORDER — CITALOPRAM HYDROBROMIDE 20 MG PO TABS
30.0000 mg | ORAL_TABLET | Freq: Every day | ORAL | 1 refills | Status: DC
  Filled 2022-03-09 – 2022-04-15 (×2): qty 135, 90d supply, fill #0
  Filled 2022-08-12: qty 135, 90d supply, fill #1

## 2022-03-10 ENCOUNTER — Other Ambulatory Visit: Payer: Self-pay | Admitting: Family Medicine

## 2022-03-10 ENCOUNTER — Other Ambulatory Visit (HOSPITAL_COMMUNITY): Payer: Self-pay

## 2022-03-10 DIAGNOSIS — E785 Hyperlipidemia, unspecified: Secondary | ICD-10-CM

## 2022-03-10 MED ORDER — ICOSAPENT ETHYL 1 G PO CAPS
2.0000 g | ORAL_CAPSULE | Freq: Two times a day (BID) | ORAL | 1 refills | Status: DC
  Filled 2022-03-10: qty 360, 90d supply, fill #0
  Filled 2022-06-21: qty 360, 90d supply, fill #1

## 2022-03-12 ENCOUNTER — Other Ambulatory Visit (HOSPITAL_COMMUNITY): Payer: Self-pay

## 2022-03-17 ENCOUNTER — Other Ambulatory Visit (HOSPITAL_COMMUNITY): Payer: Self-pay

## 2022-03-31 ENCOUNTER — Other Ambulatory Visit (HOSPITAL_COMMUNITY): Payer: Self-pay

## 2022-03-31 ENCOUNTER — Encounter (INDEPENDENT_AMBULATORY_CARE_PROVIDER_SITE_OTHER): Payer: 59 | Admitting: Family Medicine

## 2022-03-31 ENCOUNTER — Encounter: Payer: Self-pay | Admitting: Emergency Medicine

## 2022-03-31 ENCOUNTER — Emergency Department (INDEPENDENT_AMBULATORY_CARE_PROVIDER_SITE_OTHER): Payer: 59

## 2022-03-31 ENCOUNTER — Emergency Department (INDEPENDENT_AMBULATORY_CARE_PROVIDER_SITE_OTHER)
Admission: EM | Admit: 2022-03-31 | Discharge: 2022-03-31 | Disposition: A | Discharge status: Home / Self Care | Payer: 59 | Source: Home / Self Care

## 2022-03-31 ENCOUNTER — Telehealth: Payer: Self-pay

## 2022-03-31 DIAGNOSIS — R509 Fever, unspecified: Secondary | ICD-10-CM

## 2022-03-31 DIAGNOSIS — M2550 Pain in unspecified joint: Secondary | ICD-10-CM | POA: Diagnosis not present

## 2022-03-31 DIAGNOSIS — M533 Sacrococcygeal disorders, not elsewhere classified: Secondary | ICD-10-CM

## 2022-03-31 LAB — POC SARS CORONAVIRUS 2 AG -  ED: SARS Coronavirus 2 Ag: NEGATIVE

## 2022-03-31 LAB — CBC WITH DIFFERENTIAL/PLATELET
Absolute Monocytes: 326 cells/uL (ref 200–950)
Basophils Absolute: 10 cells/uL (ref 0–200)
Basophils Relative: 0.3 %
Eosinophils Absolute: 0 cells/uL — ABNORMAL LOW (ref 15–500)
Eosinophils Relative: 0 %
HCT: 33.8 % — ABNORMAL LOW (ref 35.0–45.0)
Hemoglobin: 11.2 g/dL — ABNORMAL LOW (ref 11.7–15.5)
Lymphs Abs: 525 cells/uL — ABNORMAL LOW (ref 850–3900)
MCH: 26.9 pg — ABNORMAL LOW (ref 27.0–33.0)
MCHC: 33.1 g/dL (ref 32.0–36.0)
MCV: 81.3 fL (ref 80.0–100.0)
MPV: 8.8 fL (ref 7.5–12.5)
Monocytes Relative: 10.2 %
Neutro Abs: 2339 cells/uL (ref 1500–7800)
Neutrophils Relative %: 73.1 %
Platelets: 216 10*3/uL (ref 140–400)
RBC: 4.16 10*6/uL (ref 3.80–5.10)
RDW: 14.9 % (ref 11.0–15.0)
Total Lymphocyte: 16.4 %
WBC: 3.2 10*3/uL — ABNORMAL LOW (ref 3.8–10.8)

## 2022-03-31 LAB — POCT URINALYSIS DIP (MANUAL ENTRY)
Bilirubin, UA: NEGATIVE
Blood, UA: NEGATIVE
Glucose, UA: NEGATIVE mg/dL
Ketones, POC UA: NEGATIVE mg/dL
Leukocytes, UA: NEGATIVE
Nitrite, UA: NEGATIVE
Protein Ur, POC: NEGATIVE mg/dL
Spec Grav, UA: 1.015 (ref 1.010–1.025)
Urobilinogen, UA: 0.2 E.U./dL
pH, UA: 7.5 (ref 5.0–8.0)

## 2022-03-31 LAB — POC INFLUENZA A AND B ANTIGEN (URGENT CARE ONLY)
Influenza A Ag: NEGATIVE
Influenza B Ag: NEGATIVE

## 2022-03-31 MED ORDER — HYDROCODONE-ACETAMINOPHEN 5-325 MG PO TABS
1.0000 | ORAL_TABLET | Freq: Two times a day (BID) | ORAL | 0 refills | Status: AC | PRN
  Filled 2022-03-31: qty 30, 15d supply, fill #0

## 2022-03-31 MED ORDER — ACETAMINOPHEN 500 MG PO TABS
1000.0000 mg | ORAL_TABLET | Freq: Once | ORAL | Status: AC
  Administered 2022-03-31: 1000 mg via ORAL

## 2022-03-31 NOTE — Telephone Encounter (Signed)
TCT pt notifying her of recent lab results. Pt identifiers verified and pt verified understanding.

## 2022-03-31 NOTE — ED Provider Notes (Signed)
[name]    CSN: [id number] Arrival date & time: 03/31/22  1156      History   Chief Complaint Chief Complaint  Patient presents with   Fever    HPI Kristine Stevens is a 56 y.o. female.   HPI 56 year old female presents with fever and sacrum pain for 1-2 days.  Patient is accompanied by her daughter this afternoon.  PMH significant for HTN, sicca syndrome, history of acute pancreatitis, and IBS. Upon entry into exam room patient is laying on back reporting pain of sacrum/coccyx for the past 24 hours as well.  Past Medical History:  Diagnosis Date   GERD (gastroesophageal reflux disease)    Heartburn    Hyperlipidemia    Hypertension    IBS (irritable bowel syndrome)    PONV (postoperative nausea and vomiting)    severe   Prediabetes    Sphincter of Oddi dysfunction     Patient Active Problem List   Diagnosis Date Noted   Rosacea 02/04/2022   Well adult exam 02/04/2022   Essential hypertension 09/27/2021   Insomnia secondary to depression with anxiety 09/27/2021   Tick bite 01/25/2021   Statin intolerance 04/12/2019   Sicca syndrome, unspecified 02/05/2019   Elevated transaminase measurement 11/02/2017   History of high cholesterol 05/27/2016   History of gestational diabetes 05/27/2016   History of gestational hypertension 05/27/2016   History of acute pancreatitis 05/27/2016   Prediabetes 04/17/2015   Status post laparoscopic sleeve gastrectomy June 2016 03/25/2015   Hyperlipemia 01/02/2015   Dysfunction of sphincter of Oddi 08/16/2013    Past Surgical History:  Procedure Laterality Date   ABDOMINOPLASTY  05/25/2012   CESAREAN SECTION  1991, 1998   x2   [place] Right 05/22/2013   Procedure: MINOR INCISION AND DRAINAGE OF ABSCESS;  Surgeon: Wynonia Sours, MD;  Location: [place];  Service: Orthopedics;  Laterality: Right;   KNEE ARTHROSCOPY Left    x3   LAPAROSCOPIC GASTRIC  SLEEVE RESECTION N/A 03/25/2015   Procedure: LAPAROSCOPIC GASTRIC SLEEVE RESECTION UPPER ENDOSCOPY;  Surgeon: Johnathan Hausen, MD;  Location: [facility];  Service: General;  Laterality: N/A;   [place], 2001   x2    OB History   No obstetric history on file.      Home Medications    Prior to Admission medications   Medication Sig Start Date End Date Taking? Authorizing Provider  amitriptyline (ELAVIL) 50 MG tablet Take 1 tablet (50 mg total) by mouth at bedtime. 09/22/21  Yes Luetta Nutting, DO  aspirin 81 MG tablet Take 81 mg by mouth daily.   Yes [provider]  Azelaic Acid 15 % FOAM Apply a thin layer to the face twice a day 02/04/22  Yes Luetta Nutting, DO  citalopram (CELEXA) 20 MG tablet Take 1.5 tablets (30 mg total) by mouth daily. 03/09/22  Yes Luetta Nutting, DO  gabapentin (NEURONTIN) 300 MG capsule Take 2 capsules (600 mg total) by mouth at bedtime as needed. 09/22/21  Yes Luetta Nutting, DO  HYDROcodone-acetaminophen (NORCO/VICODIN) 5-325 MG tablet Take 1 tablet by mouth 2 (two) times daily as needed for up to 7 days. 03/31/22 04/15/22 Yes Eliezer Lofts, FNP  icosapent Ethyl (VASCEPA) 1 g capsule Take 2 capsules (2 g total) by mouth 2 (two) times daily. 03/10/22  Yes Luetta Nutting, DO  losartan-hydrochlorothiazide (HYZAAR) 100-12.5 MG tablet TAKE 1 TABLET BY MOUTH DAILY. 12/04/21 12/04/22  Yes Everrett Coombe, DO  metFORMIN (GLUCOPHAGE-XR) 500 MG 24 hr tablet Take 2 tablets (1,000 mg total) by mouth daily with breakfast. 02/04/22  Yes Everrett Coombe, DO  Multiple Vitamins-Minerals (MULTIVITAMIN ADULT PO) Take by mouth daily.   Yes [provider]  TYRVAYA 0.03 MG/ACT SOLN  08/14/21  Yes [provider]  Benay Spice 5 % SOLN Instill 1 drop into both eyes twice a day 09/01/21  Yes     Family History Family History  Problem Relation Age of Onset   Hypertension Mother    Hyperlipidemia Mother    Fibromyalgia Mother    Hypertension  Father    Breast cancer Maternal Grandmother 43   Breast cancer Cousin 63   Colon cancer Neg Hx    Esophageal cancer Neg Hx    Rectal cancer Neg Hx    Stomach cancer Neg Hx     Social History Social History   Tobacco Use   Smoking status: Never   Smokeless tobacco: Never  Substance Use Topics   Alcohol use: Yes    Comment: rare   Drug use: No     Allergies   Patient has no known allergies.   Review of Systems Review of Systems  Constitutional:  Positive for fever.  Musculoskeletal:  Positive for back pain.  All other systems reviewed and are negative.    Physical Exam Triage Vital Signs ED Triage Vitals  Enc Vitals Group     BP      Pulse      Resp      Temp      Temp src      SpO2      Weight      Height      Head Circumference      Peak Flow      Pain Score      Pain Loc      Pain Edu?      Excl. in GC?    No data found.  Updated Vital Signs BP 133/84 (BP Location: Left Arm)   Pulse 94   Temp (!) 100.7 F (38.2 C) (Oral)   Resp 20   Ht 5' (1.524 m)   Wt 136 lb (61.7 kg)   SpO2 99%   BMI 26.56 kg/m     Physical Exam Vitals and nursing note reviewed.  Constitutional:      General: She is not in acute distress.    Appearance: Normal appearance. She is normal weight. She is ill-appearing.  HENT:     Head: Normocephalic and atraumatic.     Right Ear: Tympanic membrane, ear canal and external ear normal.     Left Ear: Tympanic membrane, ear canal and external ear normal.     Mouth/Throat:     Mouth: Mucous membranes are moist.     Pharynx: Oropharynx is clear.  Eyes:     Extraocular Movements: Extraocular movements intact.     Conjunctiva/sclera: Conjunctivae normal.     Pupils: Pupils are equal, round, and reactive to light.  Cardiovascular:     Rate and Rhythm: Normal rate and regular rhythm.     Pulses: Normal pulses.     Heart sounds: Normal heart sounds.  Pulmonary:     Effort: Pulmonary effort is normal.     Breath sounds:  Normal breath sounds. No wheezing, rhonchi or rales.  Abdominal:     Tenderness: There is no right CVA tenderness or left CVA tenderness.  Musculoskeletal:  Cervical back: Normal range of motion and neck supple.  Skin:    General: Skin is warm and dry.  Neurological:     General: No focal deficit present.     Mental Status: She is alert and oriented to person, place, and time.      UC Treatments / Results  Labs (all labs ordered are listed, but only abnormal results are displayed) Labs Reviewed  CBC WITH DIFFERENTIAL/PLATELET  POC SARS CORONAVIRUS 2 AG -  ED  POC INFLUENZA A AND B ANTIGEN (URGENT CARE ONLY)  POCT URINALYSIS DIP (MANUAL ENTRY)    EKG   Radiology DG Sacrum/Coccyx  Result Date: 03/31/2022 CLINICAL DATA:  Several pain EXAM: SACRUM AND COCCYX - 2+ VIEW COMPARISON:  None Available. FINDINGS: There is no evidence of fracture or other focal bone lesions. IMPRESSION: Negative. Electronically Signed   By: Sherian Rein M.D.   On: 03/31/2022 12:57    Procedures Procedures (including critical care time)  Medications Ordered in UC Medications  acetaminophen (TYLENOL) tablet 1,000 mg (1,000 mg Oral Given 03/31/22 1240)    Initial Impression / Assessment and Plan / UC Course  I have reviewed the triage vital signs and the nursing notes.  Pertinent labs & imaging results that were available during my care of the patient were reviewed by me and considered in my medical decision making (see chart for details).     MDM: 1.  Fever-Tylenol 1000 mg given once in clinic prior to discharge, influenza, COVID-19 negative today, CBC with differential ordered per patient request; 2.  Sacral pain-x-ray results of sacrum/coccyx reveals above, Rx'd Vicodin. Advised patient UA, Influenza, and COVID-19 were all negative.  Advised/informed patient of sacrum/coccyx x-ray results today with hard copy provided.  Advised patient may take Vicodin sparingly for sacral spine pain.  Advised  patient we will follow-up with lab results once received this afternoon.  Encouraged patient increase daily water intake while taking this medication advised if symptoms worsen and/or unresolved please follow-up PCP or here for further evaluation.  Work note provided to patient prior to discharge today.  Patient discharged home, hemodynamically stable Final Clinical Impressions(s) / UC Diagnoses   Final diagnoses:  Fever, unspecified  Sacral pain     Discharge Instructions      Advised patient UA, Influenza, and COVID-19 were all negative.  Advised/informed patient of sacrum/coccyx x-ray results today with hard copy provided.  Advised patient may take Vicodin sparingly for sacral spine pain.  Advised patient we will follow-up with lab results once received this afternoon.  Encouraged patient increase daily water intake while taking this medication advised if symptoms worsen and/or unresolved please follow-up PCP or here for further evaluation.     ED Prescriptions     Medication Sig Dispense Auth. Provider   HYDROcodone-acetaminophen (NORCO/VICODIN) 5-325 MG tablet Take 1 tablet by mouth 2 (two) times daily as needed for up to 7 days. 30 tablet Trevor Iha, FNP      I have reviewed the PDMP during this encounter.   Trevor Iha, FNP 03/31/22 1327

## 2022-03-31 NOTE — ED Notes (Signed)
Labs ordered STAT, [facility] called and confirmation# [id number].

## 2022-03-31 NOTE — Discharge Instructions (Addendum)
Advised patient UA, Influenza, and COVID-19 were all negative.  Advised/informed patient of sacrum/coccyx x-ray results today with hard copy provided.  Advised patient may take Vicodin sparingly for sacral spine pain.  Advised patient we will follow-up with lab results once received this afternoon.  Encouraged patient increase daily water intake while taking this medication advised if symptoms worsen and/or unresolved please follow-up PCP or here for further evaluation.

## 2022-03-31 NOTE — ED Triage Notes (Addendum)
Patient c/o fever, body aches and chills x 1 day.  Patient has taken Gabapentin and Tylenol.  No cough, congestion or nasal drainage.  Patient c/o spine pain, denies neck pain.  Mostly sacral.

## 2022-04-01 DIAGNOSIS — K219 Gastro-esophageal reflux disease without esophagitis: Secondary | ICD-10-CM | POA: Diagnosis not present

## 2022-04-01 DIAGNOSIS — R7303 Prediabetes: Secondary | ICD-10-CM | POA: Diagnosis not present

## 2022-04-01 DIAGNOSIS — A419 Sepsis, unspecified organism: Secondary | ICD-10-CM | POA: Diagnosis not present

## 2022-04-01 DIAGNOSIS — I700 Atherosclerosis of aorta: Secondary | ICD-10-CM | POA: Diagnosis not present

## 2022-04-01 DIAGNOSIS — D696 Thrombocytopenia, unspecified: Secondary | ICD-10-CM | POA: Diagnosis not present

## 2022-04-01 DIAGNOSIS — R945 Abnormal results of liver function studies: Secondary | ICD-10-CM | POA: Diagnosis not present

## 2022-04-01 DIAGNOSIS — E871 Hypo-osmolality and hyponatremia: Secondary | ICD-10-CM | POA: Diagnosis not present

## 2022-04-01 DIAGNOSIS — R059 Cough, unspecified: Secondary | ICD-10-CM | POA: Diagnosis not present

## 2022-04-01 DIAGNOSIS — M545 Low back pain, unspecified: Secondary | ICD-10-CM | POA: Diagnosis not present

## 2022-04-01 DIAGNOSIS — R52 Pain, unspecified: Secondary | ICD-10-CM | POA: Diagnosis not present

## 2022-04-01 DIAGNOSIS — F419 Anxiety disorder, unspecified: Secondary | ICD-10-CM | POA: Diagnosis not present

## 2022-04-01 DIAGNOSIS — D649 Anemia, unspecified: Secondary | ICD-10-CM | POA: Diagnosis not present

## 2022-04-01 DIAGNOSIS — E785 Hyperlipidemia, unspecified: Secondary | ICD-10-CM | POA: Diagnosis not present

## 2022-04-01 DIAGNOSIS — R6510 Systemic inflammatory response syndrome (SIRS) of non-infectious origin without acute organ dysfunction: Secondary | ICD-10-CM | POA: Diagnosis not present

## 2022-04-01 DIAGNOSIS — M5136 Other intervertebral disc degeneration, lumbar region: Secondary | ICD-10-CM | POA: Diagnosis not present

## 2022-04-01 DIAGNOSIS — N281 Cyst of kidney, acquired: Secondary | ICD-10-CM | POA: Diagnosis not present

## 2022-04-01 DIAGNOSIS — B2700 Gammaherpesviral mononucleosis without complication: Secondary | ICD-10-CM | POA: Diagnosis not present

## 2022-04-01 DIAGNOSIS — B2790 Infectious mononucleosis, unspecified without complication: Secondary | ICD-10-CM | POA: Diagnosis not present

## 2022-04-01 DIAGNOSIS — I959 Hypotension, unspecified: Secondary | ICD-10-CM | POA: Diagnosis not present

## 2022-04-01 DIAGNOSIS — M47816 Spondylosis without myelopathy or radiculopathy, lumbar region: Secondary | ICD-10-CM | POA: Diagnosis not present

## 2022-04-01 DIAGNOSIS — K5900 Constipation, unspecified: Secondary | ICD-10-CM | POA: Diagnosis not present

## 2022-04-01 DIAGNOSIS — Z20822 Contact with and (suspected) exposure to covid-19: Secondary | ICD-10-CM | POA: Diagnosis not present

## 2022-04-01 DIAGNOSIS — I10 Essential (primary) hypertension: Secondary | ICD-10-CM | POA: Diagnosis not present

## 2022-04-01 DIAGNOSIS — K5730 Diverticulosis of large intestine without perforation or abscess without bleeding: Secondary | ICD-10-CM | POA: Diagnosis not present

## 2022-04-01 DIAGNOSIS — N3289 Other specified disorders of bladder: Secondary | ICD-10-CM | POA: Diagnosis not present

## 2022-04-01 DIAGNOSIS — Z9049 Acquired absence of other specified parts of digestive tract: Secondary | ICD-10-CM | POA: Diagnosis not present

## 2022-04-01 DIAGNOSIS — R509 Fever, unspecified: Secondary | ICD-10-CM | POA: Diagnosis not present

## 2022-04-01 DIAGNOSIS — M549 Dorsalgia, unspecified: Secondary | ICD-10-CM | POA: Diagnosis not present

## 2022-04-01 DIAGNOSIS — R531 Weakness: Secondary | ICD-10-CM | POA: Diagnosis not present

## 2022-04-01 DIAGNOSIS — R748 Abnormal levels of other serum enzymes: Secondary | ICD-10-CM | POA: Diagnosis not present

## 2022-04-01 DIAGNOSIS — R7989 Other specified abnormal findings of blood chemistry: Secondary | ICD-10-CM | POA: Diagnosis not present

## 2022-04-01 NOTE — Telephone Encounter (Signed)
Please see the MyChart message reply(ies) for my assessment and plan.    This patient gave consent for this Medical Advice Message and is aware that it may result in a bill to [employer], as well as the possibility of receiving a bill for a co-payment or deductible. They are an established patient, but are not seeking medical advice exclusively about a problem treated during an in person or video visit in the last seven days. I did not recommend an in person or video visit within seven days of my reply.   Review of Urgent care records and lab work.  Formulation of assessment and medical advices and lab orders placed.     I spent a total of 15 minutes cumulative time within 7 days through [employer].  Everrett Coombe, DO

## 2022-04-03 DIAGNOSIS — M3500 Sicca syndrome, unspecified: Secondary | ICD-10-CM | POA: Insufficient documentation

## 2022-04-06 DIAGNOSIS — B2790 Infectious mononucleosis, unspecified without complication: Secondary | ICD-10-CM | POA: Insufficient documentation

## 2022-04-07 ENCOUNTER — Other Ambulatory Visit (HOSPITAL_COMMUNITY): Payer: Self-pay

## 2022-04-07 MED ORDER — TRAMADOL HCL 50 MG PO TABS
50.0000 mg | ORAL_TABLET | Freq: Three times a day (TID) | ORAL | 0 refills | Status: DC | PRN
  Filled 2022-04-07: qty 9, 3d supply, fill #0

## 2022-04-07 MED ORDER — TIZANIDINE HCL 2 MG PO TABS
2.0000 mg | ORAL_TABLET | Freq: Three times a day (TID) | ORAL | 0 refills | Status: DC | PRN
Start: 2022-04-07 — End: 2022-04-19
  Filled 2022-04-07: qty 9, 3d supply, fill #0

## 2022-04-07 MED ORDER — DOXYCYCLINE HYCLATE 100 MG PO TABS
100.0000 mg | ORAL_TABLET | Freq: Two times a day (BID) | ORAL | 0 refills | Status: DC
  Filled 2022-04-07: qty 14, 7d supply, fill #0

## 2022-04-07 MED ORDER — ONDANSETRON 4 MG PO TBDP
4.0000 mg | ORAL_TABLET | Freq: Three times a day (TID) | ORAL | 0 refills | Status: DC | PRN
  Filled 2022-04-07: qty 20, 7d supply, fill #0

## 2022-04-08 ENCOUNTER — Other Ambulatory Visit (HOSPITAL_COMMUNITY): Payer: Self-pay

## 2022-04-12 ENCOUNTER — Telehealth: Payer: Self-pay | Admitting: Family Medicine

## 2022-04-12 NOTE — Telephone Encounter (Signed)
Pt called this morning to schedule a HFU. She is scheduled on 07/24. She also stated that she needs labs due to liver function studies were high.

## 2022-04-12 NOTE — Telephone Encounter (Signed)
Attempted to contact patient regarding provider's response. No answer, unable to leave a vm msg. Vm is full.

## 2022-04-12 NOTE — Telephone Encounter (Signed)
Noted, will repeat labs at visit.

## 2022-04-14 ENCOUNTER — Other Ambulatory Visit: Payer: Self-pay | Admitting: Family Medicine

## 2022-04-14 DIAGNOSIS — A419 Sepsis, unspecified organism: Secondary | ICD-10-CM

## 2022-04-14 NOTE — Telephone Encounter (Signed)
Patient has been scheduled for Labs for in the morning @ 11am.

## 2022-04-14 NOTE — Progress Notes (Signed)
cmp

## 2022-04-14 NOTE — Telephone Encounter (Signed)
Orders entered

## 2022-04-15 ENCOUNTER — Other Ambulatory Visit: Payer: 59

## 2022-04-15 ENCOUNTER — Other Ambulatory Visit (HOSPITAL_COMMUNITY): Payer: Self-pay

## 2022-04-15 DIAGNOSIS — R509 Fever, unspecified: Secondary | ICD-10-CM | POA: Diagnosis not present

## 2022-04-15 DIAGNOSIS — A419 Sepsis, unspecified organism: Secondary | ICD-10-CM | POA: Diagnosis not present

## 2022-04-15 DIAGNOSIS — M2550 Pain in unspecified joint: Secondary | ICD-10-CM | POA: Diagnosis not present

## 2022-04-18 LAB — ANA,IFA RA DIAG PNL W/RFLX TIT/PATN
Anti Nuclear Antibody (ANA): NEGATIVE
Cyclic Citrullin Peptide Ab: 81 UNITS — ABNORMAL HIGH
Rheumatoid fact SerPl-aCnc: <14 IU/mL (ref <14)

## 2022-04-18 LAB — HEPATIC FUNCTION PANEL
AG Ratio: 1.3 (calc) (ref 1.0–2.5)
ALT: 37 U/L — ABNORMAL HIGH (ref 6–29)
AST: 18 U/L (ref 10–35)
Albumin: 4 g/dL (ref 3.6–5.1)
Alkaline phosphatase (APISO): 114 U/L (ref 37–153)
Bilirubin, Direct: 0.1 mg/dL (ref 0.0–0.2)
Globulin: 3 g/dL (calc) (ref 1.9–3.7)
Indirect Bilirubin: 0.4 mg/dL (calc) (ref 0.2–1.2)
Total Bilirubin: 0.5 mg/dL (ref 0.2–1.2)
Total Protein: 7 g/dL (ref 6.1–8.1)

## 2022-04-18 LAB — CBC WITH DIFFERENTIAL/PLATELET
Absolute Monocytes: 802 cells/uL (ref 200–950)
Basophils Absolute: 41 cells/uL (ref 0–200)
Basophils Relative: 0.5 %
Eosinophils Absolute: 89 cells/uL (ref 15–500)
Eosinophils Relative: 1.1 %
HCT: 32.3 % — ABNORMAL LOW (ref 35.0–45.0)
Hemoglobin: 10.4 g/dL — ABNORMAL LOW (ref 11.7–15.5)
Lymphs Abs: 4560 cells/uL — ABNORMAL HIGH (ref 850–3900)
MCH: 26.7 pg — ABNORMAL LOW (ref 27.0–33.0)
MCHC: 32.2 g/dL (ref 32.0–36.0)
MCV: 83 fL (ref 80.0–100.0)
MPV: 9.2 fL (ref 7.5–12.5)
Monocytes Relative: 9.9 %
Neutro Abs: 2608 cells/uL (ref 1500–7800)
Neutrophils Relative %: 32.2 %
Platelets: 736 10*3/uL — ABNORMAL HIGH (ref 140–400)
RBC: 3.89 10*6/uL (ref 3.80–5.10)
RDW: 14.8 % (ref 11.0–15.0)
Total Lymphocyte: 56.3 %
WBC: 8.1 10*3/uL (ref 3.8–10.8)

## 2022-04-18 LAB — BASIC METABOLIC PANEL
BUN: 8 mg/dL (ref 7–25)
CO2: 25 mmol/L (ref 20–32)
Calcium: 9.4 mg/dL (ref 8.6–10.4)
Chloride: 106 mmol/L (ref 98–110)
Creat: 0.59 mg/dL (ref 0.50–1.03)
Glucose, Bld: 84 mg/dL (ref 65–99)
Potassium: 4.2 mmol/L (ref 3.5–5.3)
Sodium: 141 mmol/L (ref 135–146)

## 2022-04-18 LAB — C-REACTIVE PROTEIN: CRP: 1.4 mg/L (ref <8.0)

## 2022-04-18 LAB — SEDIMENTATION RATE: Sed Rate: 19 mm/h (ref 0–30)

## 2022-04-19 ENCOUNTER — Other Ambulatory Visit (HOSPITAL_COMMUNITY): Payer: Self-pay

## 2022-04-19 ENCOUNTER — Ambulatory Visit: Payer: 59 | Admitting: Family Medicine

## 2022-04-19 ENCOUNTER — Encounter: Payer: Self-pay | Admitting: Family Medicine

## 2022-04-19 VITALS — BP 114/79 | HR 101 | Ht 60.0 in | Wt 140.0 lb

## 2022-04-19 DIAGNOSIS — S70261A Insect bite (nonvenomous), right hip, initial encounter: Secondary | ICD-10-CM | POA: Diagnosis not present

## 2022-04-19 DIAGNOSIS — R509 Fever, unspecified: Secondary | ICD-10-CM | POA: Diagnosis not present

## 2022-04-19 DIAGNOSIS — R7401 Elevation of levels of liver transaminase levels: Secondary | ICD-10-CM | POA: Diagnosis not present

## 2022-04-19 DIAGNOSIS — R768 Other specified abnormal immunological findings in serum: Secondary | ICD-10-CM | POA: Diagnosis not present

## 2022-04-19 DIAGNOSIS — M3501 Sicca syndrome with keratoconjunctivitis: Secondary | ICD-10-CM

## 2022-04-19 DIAGNOSIS — W57XXXA Bitten or stung by nonvenomous insect and other nonvenomous arthropods, initial encounter: Secondary | ICD-10-CM

## 2022-04-19 MED ORDER — TIZANIDINE HCL 2 MG PO TABS
2.0000 mg | ORAL_TABLET | Freq: Three times a day (TID) | ORAL | 1 refills | Status: DC | PRN
  Filled 2022-04-19: qty 30, 5d supply, fill #0
  Filled 2022-06-03: qty 30, 5d supply, fill #1

## 2022-04-19 NOTE — Progress Notes (Signed)
Kristine Stevens - 56 y.o. female MRN [id number]  Date of birth: October 22, 1965  Subjective Chief Complaint  Patient presents with   Hospitalization Follow-up    HPI Kristine Stevens is a 56 year old female here today for hospital follow-up.  Recently hospitalized for SIRS and fever of unknown origin.  Initially seen in urgent care for fever, thought to be due to viral etiology.  Symptoms continued and worsened over the next day and was eventually seen in the emergency department.  LFTs noted to be significant elevated on lab work.  Initially started on broad-spectrum antibiotics.  Infectious disease consulted and felt this may be due to tickborne illness.  Initial titers were negative.  She was treated with a course of doxycycline.  She does have a history of Sjogren's syndrome a with positive ANA.  Her recent labs showed negative ANA however her CCP is elevated.  Platelets are elevated as well with some mild anemia.  Transaminitis has improved significantly.  She does continue to have some fatigue.  She is also having some intermittent dizziness.  She is continue to have chills in the evenings.  She does have mildly elevated temperature.  She has not noted any lymph node swelling.  She denies any respiratory symptoms or preceding illness.  ROS:  A comprehensive ROS was completed and negative except as noted per HPI  No Known Allergies  Past Medical History:  Diagnosis Date   GERD (gastroesophageal reflux disease)    Heartburn    Hyperlipidemia    Hypertension    IBS (irritable bowel syndrome)    PONV (postoperative nausea and vomiting)    severe   Prediabetes    Sphincter of Oddi dysfunction     Past Surgical History:  Procedure Laterality Date   ABDOMINOPLASTY  05/25/2012   CESAREAN SECTION  1991, 1998   x2   CHOLECYSTECTOMY  1995   IRRIGATION AND DEBRIDEMENT ABSCESS Right 05/22/2013   Procedure: MINOR INCISION AND DRAINAGE OF ABSCESS;  Surgeon: Nicki Reaper, MD;  Location: [facility];  Service: Orthopedics;  Laterality: Right;   KNEE ARTHROSCOPY Left    x3   LAPAROSCOPIC GASTRIC SLEEVE RESECTION N/A 03/25/2015   Procedure: LAPAROSCOPIC GASTRIC SLEEVE RESECTION UPPER ENDOSCOPY;  Surgeon: Luretha Murphy, MD;  Location: [facility];  Service: General;  Laterality: N/A;   VAGINAL BIRTH AFTER CESAREAN SECTION  1993, 2001   x2    Social History   Socioeconomic History   Marital status: Married    Spouse name: Not on file   Number of children: Not on file   Years of education: Not on file   Highest education level: Not on file  Occupational History   Not on file  Tobacco Use   Smoking status: Never   Smokeless tobacco: Never  Substance and Sexual Activity   Alcohol use: Yes    Comment: rare   Drug use: No   Sexual activity: Yes  Other Topics Concern   Not on file  Social History Narrative   Not on file   Social Determinants of Health   Financial Resource Strain: Not on file  Food Insecurity: Not on file  Transportation Needs: Not on file  Physical Activity: Not on file  Stress: Not on file  Social Connections: Not on file    Family History  Problem Relation Age of Onset   Hypertension Mother    Hyperlipidemia Mother    Fibromyalgia Mother    Hypertension Father    Breast cancer Maternal Grandmother  70   Breast cancer Cousin 45   Colon cancer Neg Hx    Esophageal cancer Neg Hx    Rectal cancer Neg Hx    Stomach cancer Neg Hx     Health Maintenance  Topic Date Due   COVID-19 Vaccine (2 - Booster for Janssen series) 05/28/2022 (Originally 02/27/2020)   Hepatitis C Screening  05/28/2022 (Originally 11/22/1984)   HIV Screening  05/28/2022 (Originally 11/22/1981)   INFLUENZA VACCINE  04/27/2022   MAMMOGRAM  07/15/2022   PAP SMEAR-Modifier  04/11/2024   TETANUS/TDAP  03/22/2027   COLONOSCOPY (Pts 45-41yrs Insurance coverage will need to be confirmed)  05/26/2027   Zoster Vaccines- Shingrix  Completed   HPV VACCINES  Aged Out      ----------------------------------------------------------------------------------------------------------------------------------------------------------------------------------------------------------------- Physical Exam BP 114/79 (BP Location: Left Arm, Patient Position: Sitting, Cuff Size: Normal)   Pulse (!) 101   Ht 5' (1.524 m)   Wt 140 lb (63.5 kg)   SpO2 100%   BMI 27.34 kg/m   Physical Exam Constitutional:      Appearance: Normal appearance.  Eyes:     General: No scleral icterus. Cardiovascular:     Rate and Rhythm: Normal rate and regular rhythm.  Pulmonary:     Effort: Pulmonary effort is normal.     Breath sounds: Normal breath sounds.  Musculoskeletal:     Cervical back: Neck supple.  Neurological:     General: No focal deficit present.     Mental Status: She is alert.  Psychiatric:        Mood and Affect: Mood normal.        Behavior: Behavior normal.     ------------------------------------------------------------------------------------------------------------------------------------------------------------------------------------------------------------------- Assessment and Plan  Elevated transaminase measurement Transaminitis is nearly resolved.  Sjogren's syndrome (HCC) History of Sjogren's syndrome with positive ANA.  ANA on recent lab work negative however her CCP is elevated.  Question possible RA or adult onset stills disease.  Checking ferritin level today.  FUO (fever of unknown origin) Unclear etiology of previous fevers.  She is continuing to have some chills and mildly elevated temp at home.  Favor autoimmune etiology.  Tickborne illness still a possibility as well.  She has completed course of doxycycline.  We will repeat tick serology.  Also checking CMV.   Meds ordered this encounter  Medications   tiZANidine (ZANAFLEX) 2 MG tablet    Sig: Take 1-2 tablets (2-4 mg total) by mouth every 8 (eight) hours as needed.    Dispense:   30 tablet    Refill:  1    No follow-ups on file.    This visit occurred during the SARS-CoV-2 public health emergency.  Safety protocols were in place, including screening questions prior to the visit, additional usage of staff PPE, and extensive cleaning of exam room while observing appropriate contact time as indicated for disinfecting solutions.

## 2022-04-19 NOTE — Assessment & Plan Note (Signed)
Unclear etiology of previous fevers.  She is continuing to have some chills and mildly elevated temp at home.  Favor autoimmune etiology.  Tickborne illness still a possibility as well.  She has completed course of doxycycline.  We will repeat tick serology.  Also checking CMV.

## 2022-04-19 NOTE — Patient Instructions (Addendum)
Great to see you! We'll be in touch with your lab results.  Referral placed to rheumatology.  You will be contacted to set this up.

## 2022-04-19 NOTE — Assessment & Plan Note (Signed)
Transaminitis is nearly resolved.

## 2022-04-19 NOTE — Assessment & Plan Note (Signed)
History of Sjogren's syndrome with positive ANA.  ANA on recent lab work negative however her CCP is elevated.  Question possible RA or adult onset stills disease.  Checking ferritin level today.

## 2022-04-23 ENCOUNTER — Other Ambulatory Visit (HOSPITAL_COMMUNITY): Payer: Self-pay

## 2022-04-23 LAB — B. BURGDORFI ANTIBODIES BY WB

## 2022-04-23 LAB — EHRLICHIA ANTIBODY PANEL
E. CHAFFEENSIS AB IGG: <1:64 {titer}
E. CHAFFEENSIS AB IGM: 1:20 {titer} — ABNORMAL HIGH

## 2022-04-23 LAB — CBC WITH DIFFERENTIAL/PLATELET
Absolute Monocytes: 517 cells/uL (ref 200–950)
Basophils Absolute: 41 cells/uL (ref 0–200)
Basophils Relative: 0.6 %
Eosinophils Absolute: 82 cells/uL (ref 15–500)
Eosinophils Relative: 1.2 %
HCT: 34.7 % — ABNORMAL LOW (ref 35.0–45.0)
Hemoglobin: 10.9 g/dL — ABNORMAL LOW (ref 11.7–15.5)
Lymphs Abs: 3577 cells/uL (ref 850–3900)
MCH: 26.2 pg — ABNORMAL LOW (ref 27.0–33.0)
MCHC: 31.4 g/dL — ABNORMAL LOW (ref 32.0–36.0)
MCV: 83.4 fL (ref 80.0–100.0)
MPV: 9.3 fL (ref 7.5–12.5)
Monocytes Relative: 7.6 %
Neutro Abs: 2584 cells/uL (ref 1500–7800)
Neutrophils Relative %: 38 %
Platelets: 623 10*3/uL — ABNORMAL HIGH (ref 140–400)
RBC: 4.16 10*6/uL (ref 3.80–5.10)
RDW: 14.4 % (ref 11.0–15.0)
Total Lymphocyte: 52.6 %
WBC: 6.8 10*3/uL (ref 3.8–10.8)

## 2022-04-23 LAB — PATHOLOGIST SMEAR REVIEW

## 2022-04-23 LAB — ROCKY MTN SPOTTED FVR ABS PNL(IGG+IGM)
RMSF IgG: NOT DETECTED
RMSF IgM: NOT DETECTED

## 2022-04-23 LAB — FERRITIN: Ferritin: 11 ng/mL — ABNORMAL LOW (ref 16–232)

## 2022-04-23 LAB — CMV ABS, IGG+IGM (CYTOMEGALOVIRUS)
CMV IgM: 40.2 AU/mL — ABNORMAL HIGH
Cytomegalovirus Ab-IgG: >10 U/mL — ABNORMAL HIGH

## 2022-05-12 ENCOUNTER — Ambulatory Visit: Payer: 59 | Admitting: Family Medicine

## 2022-05-21 ENCOUNTER — Telehealth: Payer: Self-pay | Admitting: Family Medicine

## 2022-05-21 NOTE — Telephone Encounter (Signed)
Patient is scheduled for an appointment on 9/6 and she is wondering will see need bloodwork. If so she would like to come in before the appointment.

## 2022-06-02 ENCOUNTER — Encounter: Payer: Self-pay | Admitting: Family Medicine

## 2022-06-02 ENCOUNTER — Ambulatory Visit (INDEPENDENT_AMBULATORY_CARE_PROVIDER_SITE_OTHER): Payer: 59 | Admitting: Family Medicine

## 2022-06-02 ENCOUNTER — Other Ambulatory Visit (HOSPITAL_COMMUNITY): Payer: Self-pay

## 2022-06-02 VITALS — BP 134/78 | HR 95 | Ht 60.0 in | Wt 142.0 lb

## 2022-06-02 DIAGNOSIS — R509 Fever, unspecified: Secondary | ICD-10-CM

## 2022-06-02 DIAGNOSIS — Z23 Encounter for immunization: Secondary | ICD-10-CM

## 2022-06-02 DIAGNOSIS — M2550 Pain in unspecified joint: Secondary | ICD-10-CM | POA: Diagnosis not present

## 2022-06-02 DIAGNOSIS — L719 Rosacea, unspecified: Secondary | ICD-10-CM

## 2022-06-02 MED ORDER — TRAMADOL-ACETAMINOPHEN 37.5-325 MG PO TABS
1.0000 | ORAL_TABLET | Freq: Four times a day (QID) | ORAL | 0 refills | Status: DC | PRN
  Filled 2022-06-02: qty 30, 8d supply, fill #0

## 2022-06-02 NOTE — Progress Notes (Signed)
Kristine Stevens - 56 y.o. female MRN [id number]  Date of birth: June 02, 1966  Subjective Chief Complaint  Patient presents with   Rosacea   Hospitalization Follow-up    HPI Kristine Stevens is a 56 year old female here today for follow-up visit.  Hospitalized previously for possible sepsis and fever of unknown origin.  Lab testing in the hospital did not reveal any specific etiology.  We did additional testing at her follow-up visit which returned positive for CMV serology.  Additionally she had had positive CCP with negative rheumatoid factor.  Her Ehrlichia IgM was mildly positive.  She had received a course of doxycycline during her hospitalization.  She has not had any further fevers.  She does continue to have joint pain.  Unable to utilize NSAIDs due to history of gastric sleeve.  Tylenol does provide some relief.  She did try tramadol however this seemed too strong for her.  She does have an upcoming visit with rheumatology.  Azelaic acid was previously prescribed for her rosacea which seems to be helping.  She is considering having some laser treatment of some of the scarring on her face.  ROS:  A comprehensive ROS was completed and negative except as noted per HPI  No Known Allergies  Past Medical History:  Diagnosis Date   GERD (gastroesophageal reflux disease)    Heartburn    Hyperlipidemia    Hypertension    IBS (irritable bowel syndrome)    PONV (postoperative nausea and vomiting)    severe   Prediabetes    Sphincter of Oddi dysfunction     Past Surgical History:  Procedure Laterality Date   ABDOMINOPLASTY  05/25/2012   CESAREAN SECTION  1991, 1998   x2   CHOLECYSTECTOMY  1995   IRRIGATION AND DEBRIDEMENT ABSCESS Right 05/22/2013   Procedure: MINOR INCISION AND DRAINAGE OF ABSCESS;  Surgeon: Nicki Reaper, MD;  Location: [facility];  Service: Orthopedics;  Laterality: Right;   KNEE ARTHROSCOPY Left    x3   LAPAROSCOPIC GASTRIC SLEEVE RESECTION N/A 03/25/2015    Procedure: LAPAROSCOPIC GASTRIC SLEEVE RESECTION UPPER ENDOSCOPY;  Surgeon: Luretha Murphy, MD;  Location: [facility];  Service: General;  Laterality: N/A;   VAGINAL BIRTH AFTER CESAREAN SECTION  1993, 2001   x2    Social History   Socioeconomic History   Marital status: Married    Spouse name: Not on file   Number of children: Not on file   Years of education: Not on file   Highest education level: Not on file  Occupational History   Not on file  Tobacco Use   Smoking status: Never   Smokeless tobacco: Never  Substance and Sexual Activity   Alcohol use: Yes    Comment: rare   Drug use: No   Sexual activity: Yes  Other Topics Concern   Not on file  Social History Narrative   Not on file   Social Determinants of Health   Financial Resource Strain: Not on file  Food Insecurity: Not on file  Transportation Needs: Not on file  Physical Activity: Not on file  Stress: Not on file  Social Connections: Not on file    Family History  Problem Relation Age of Onset   Hypertension Mother    Hyperlipidemia Mother    Fibromyalgia Mother    Hypertension Father    Breast cancer Maternal Grandmother 74   Breast cancer Cousin 12   Colon cancer Neg Hx    Esophageal cancer Neg Hx  Rectal cancer Neg Hx    Stomach cancer Neg Hx     Health Maintenance  Topic Date Due   COVID-19 Vaccine (2 - Booster for Janssen series) 10/28/2022 (Originally 02/27/2020)   Hepatitis C Screening  06/03/2023 (Originally 11/22/1984)   HIV Screening  06/03/2023 (Originally 11/22/1981)   MAMMOGRAM  07/15/2022   PAP SMEAR-Modifier  04/11/2024   TETANUS/TDAP  03/22/2027   COLONOSCOPY (Pts 45-72yrs Insurance coverage will need to be confirmed)  05/26/2027   INFLUENZA VACCINE  Completed   Zoster Vaccines- Shingrix  Completed   HPV VACCINES  Aged Out      ----------------------------------------------------------------------------------------------------------------------------------------------------------------------------------------------------------------- Physical Exam BP 134/78 (BP Location: Left Arm, Patient Position: Sitting, Cuff Size: Normal)   Pulse 95   Ht 5' (1.524 m)   Wt 142 lb (64.4 kg)   SpO2 99%   BMI 27.73 kg/m   Physical Exam Constitutional:      Appearance: Normal appearance.  Eyes:     General: No scleral icterus. Cardiovascular:     Rate and Rhythm: Normal rate and regular rhythm.  Pulmonary:     Effort: Pulmonary effort is normal.     Breath sounds: Normal breath sounds.  Musculoskeletal:     Cervical back: Neck supple.  Neurological:     Mental Status: She is alert.  Psychiatric:        Mood and Affect: Mood normal.        Behavior: Behavior normal.     ------------------------------------------------------------------------------------------------------------------------------------------------------------------------------------------------------------------- Assessment and Plan  FUO (fever of unknown origin) Based on her lab testing I think likely cause may be an CMV infection.  Her Ehrlichia IgM was mildly elevated as well.  She was treated with a course of doxycycline during her hospitalization.  No further fevers at this time.  Joint pain She does have history of Sicca syndrome.  Recent labs with elevated CCP, normal rheumatoid factor.  ANA negative.  She does have an upcoming visit with rheumatology.  Unable to utilize NSAIDs regularly.  Adding Ultracet to help with pain.  Rosacea Azelaic acid seems to be working fairly well for her.  We will plan to continue this.   Meds ordered this encounter  Medications   traMADol-acetaminophen (ULTRACET) 37.5-325 MG tablet    Sig: Take 1 tablet by mouth every 6 (six) hours as needed.    Dispense:  30 tablet    Refill:  0    No follow-ups  on file.    This visit occurred during the SARS-CoV-2 public health emergency.  Safety protocols were in place, including screening questions prior to the visit, additional usage of staff PPE, and extensive cleaning of exam room while observing appropriate contact time as indicated for disinfecting solutions.

## 2022-06-02 NOTE — Assessment & Plan Note (Signed)
Azelaic acid seems to be working fairly well for her.  We will plan to continue this.

## 2022-06-02 NOTE — Patient Instructions (Signed)
Great to see you today! Keep me up to date on what your blood sugars look like.  Try ultracet as needed for pain.

## 2022-06-02 NOTE — Assessment & Plan Note (Signed)
Based on her lab testing I think likely cause may be an CMV infection.  Her Ehrlichia IgM was mildly elevated as well.  She was treated with a course of doxycycline during her hospitalization.  No further fevers at this time.

## 2022-06-02 NOTE — Assessment & Plan Note (Addendum)
She does have history of Sicca syndrome.  Recent labs with elevated CCP, normal rheumatoid factor.  ANA negative.  She does have an upcoming visit with rheumatology.  Unable to utilize NSAIDs regularly.  Adding Ultracet to help with pain.

## 2022-06-03 ENCOUNTER — Other Ambulatory Visit (HOSPITAL_COMMUNITY): Payer: Self-pay

## 2022-06-21 ENCOUNTER — Other Ambulatory Visit: Payer: Self-pay | Admitting: Family Medicine

## 2022-06-21 ENCOUNTER — Other Ambulatory Visit (HOSPITAL_COMMUNITY): Payer: Self-pay

## 2022-06-21 ENCOUNTER — Encounter (INDEPENDENT_AMBULATORY_CARE_PROVIDER_SITE_OTHER): Payer: 59 | Admitting: Family Medicine

## 2022-06-21 DIAGNOSIS — M25559 Pain in unspecified hip: Secondary | ICD-10-CM

## 2022-06-21 MED ORDER — FINACEA 15 % EX FOAM
CUTANEOUS | 1 refills | Status: DC
  Filled 2022-06-21: qty 50, 30d supply, fill #0
  Filled 2022-08-12: qty 50, 30d supply, fill #1

## 2022-06-21 NOTE — Telephone Encounter (Signed)
Please see the MyChart message reply(ies) for my assessment and plan.    This patient gave consent for this Medical Advice Message and is aware that it may result in a bill to their insurance company, as well as the possibility of receiving a bill for a co-payment or deductible. They are an established patient, but are not seeking medical advice exclusively about a problem treated during an in person or video visit in the last seven days. I did not recommend an in person or video visit within seven days of my reply.    I spent a total of 6 minutes cumulative time within 7 days through MyChart messaging.  Michai Dieppa, DO   

## 2022-06-23 ENCOUNTER — Other Ambulatory Visit (HOSPITAL_COMMUNITY): Payer: Self-pay

## 2022-07-05 DIAGNOSIS — Z760 Encounter for issue of repeat prescription: Secondary | ICD-10-CM | POA: Diagnosis not present

## 2022-07-15 DIAGNOSIS — Z1231 Encounter for screening mammogram for malignant neoplasm of breast: Secondary | ICD-10-CM

## 2022-07-22 ENCOUNTER — Ambulatory Visit: Payer: 59 | Attending: Internal Medicine | Admitting: Internal Medicine

## 2022-07-22 ENCOUNTER — Encounter: Payer: Self-pay | Admitting: Internal Medicine

## 2022-07-22 ENCOUNTER — Ambulatory Visit (INDEPENDENT_AMBULATORY_CARE_PROVIDER_SITE_OTHER): Payer: 59

## 2022-07-22 VITALS — BP 137/82 | HR 83 | Resp 14 | Ht 61.0 in | Wt 146.0 lb

## 2022-07-22 DIAGNOSIS — Z1231 Encounter for screening mammogram for malignant neoplasm of breast: Secondary | ICD-10-CM

## 2022-07-22 DIAGNOSIS — M2550 Pain in unspecified joint: Secondary | ICD-10-CM

## 2022-07-22 DIAGNOSIS — M059 Rheumatoid arthritis with rheumatoid factor, unspecified: Secondary | ICD-10-CM | POA: Insufficient documentation

## 2022-07-22 DIAGNOSIS — M3501 Sicca syndrome with keratoconjunctivitis: Secondary | ICD-10-CM

## 2022-07-22 DIAGNOSIS — R768 Other specified abnormal immunological findings in serum: Secondary | ICD-10-CM | POA: Diagnosis not present

## 2022-07-22 DIAGNOSIS — K834 Spasm of sphincter of Oddi: Secondary | ICD-10-CM

## 2022-07-22 NOTE — Progress Notes (Signed)
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 Patient wants to proactively manage RA. Pain in joints, especially hips, lower back, knees, feet, and hands.)   History of Present Illness: Kristine Stevens is a 56 y.o. female here for evaluation of fevers and pain associated with positive CCP Abs and history of sjogren syndrome. She was recently hospitalized with SIRS and suspected infection but no organism identified also concerned for underlying autoimmune process. She completed a course of doxycycline for possible tickborne illness. CMV testing was positive for IgG and IgM. Ferritin low at 11 with findings consistent for IDA.   Symptoms started with severe low back pain then progressed to whole spine and headache and fevers. After one week hospitalization symptoms improved and has returned mostly to baseline. Still has pain in the low back more frequently than before. Notices left hip pain pretty regularly and popping or cracking sensation in her thoracic spine. Stiffness and pain commonly in both feet this lasts for about 1 hour also gets elbows and hands for 30-60 minutes. She takes ibuprofen rarely due to previous bariatric surgery. Takes gabapentin 600 mg at night when she is not working which helps partially.  She has a history of gastric sleeve surgery and has some chronic diarrhea but this was more related to sphincter of Oddi dysfunction before this too. She had 3 left knee surgeries she had meniscal injury there as a teenager. She has a family history of RA in her mother who had deforming disease involving both hands.  Labs reviewed 03/2022 ANA neg RF  neg CCP 81 Ferritin 11 Lyme neg Erlichiosis IgM equivocal CMV IgG pos IgM pos  Activities of Daily Living:  Patient reports morning stiffness for 1 hour.   Patient Reports nocturnal pain.  Difficulty dressing/grooming: Denies Difficulty climbing stairs: Denies Difficulty getting out of chair: Denies Difficulty using hands for taps, buttons, cutlery, and/or writing: Denies  Review of Systems  Constitutional:  Positive for fatigue.  HENT:  Positive for mouth sores and mouth dryness.   Eyes:  Positive for dryness.  Respiratory:  Negative for shortness of breath.   Cardiovascular:  Positive for palpitations. Negative for chest pain.  Gastrointestinal:  Negative for blood in stool, constipation and diarrhea.  Endocrine: Negative for increased urination.  Genitourinary:  Negative for involuntary urination.  Musculoskeletal:  Positive for joint pain, joint pain and morning stiffness. Negative for gait problem, joint swelling, myalgias, muscle weakness, muscle tenderness and myalgias.  Skin:  Positive for hair loss. Negative for color change, rash and sensitivity to sunlight.  Allergic/Immunologic: Positive for susceptible to infections.  Neurological:  Negative for dizziness and headaches.  Hematological:  Negative for swollen glands.  Psychiatric/Behavioral:  Positive for depressed mood and sleep disturbance. The patient is nervous/anxious.     PMFS History:  Patient Active Problem List   Diagnosis Date Noted   Seropositive rheumatoid arthritis (HCC) 07/22/2022   Joint pain 06/02/2022   FUO (fever of unknown origin) 04/19/2022   Sjogren's syndrome (HCC) 04/03/2022   Rosacea 02/04/2022  Well adult exam 02/04/2022   Essential hypertension 09/27/2021   Insomnia secondary to depression with anxiety 09/27/2021   Tick bite 01/25/2021   Statin intolerance 04/12/2019   Sicca syndrome, unspecified 02/05/2019   Elevated transaminase measurement 11/02/2017   History of high cholesterol  05/27/2016   History of gestational diabetes 05/27/2016   History of acute pancreatitis 05/27/2016   Prediabetes 04/17/2015   Status post laparoscopic sleeve gastrectomy June 2016 03/25/2015   Hyperlipemia 01/02/2015   Dysfunction of sphincter of Oddi 08/16/2013    Past Medical History:  Diagnosis Date   GERD (gastroesophageal reflux disease)    Heartburn    Hyperlipidemia    Hypertension    IBS (irritable bowel syndrome)    PONV (postoperative nausea and vomiting)    severe   Prediabetes    Rosacea    Sjogren's syndrome (HCC)    Sphincter of Oddi dysfunction     Family History  Problem Relation Age of Onset   Hypertension Mother    Hyperlipidemia Mother    Fibromyalgia Mother    Parkinson's disease Mother    Rheum arthritis Mother    Hyperlipidemia Father    Hypertension Father    Parkinson's disease Brother    Mental retardation Brother    Hyperlipidemia Brother    Breast cancer Maternal Grandmother 37   Healthy Daughter    Breast cancer Cousin 27   Colon cancer Neg Hx    Esophageal cancer Neg Hx    Rectal cancer Neg Hx    Stomach cancer Neg Hx    Past Surgical History:  Procedure Laterality Date   ABDOMINOPLASTY  05/25/2012   CESAREAN SECTION  1991, 1998   x2   CHOLECYSTECTOMY  1995   IRRIGATION AND DEBRIDEMENT ABSCESS Right 05/22/2013   Procedure: MINOR INCISION AND DRAINAGE OF ABSCESS;  Surgeon: Wynonia Sours, MD;  Location: [facility];  Service: Orthopedics;  Laterality: Right;   KNEE ARTHROSCOPY Left    x3   LAPAROSCOPIC GASTRIC SLEEVE RESECTION N/A 03/25/2015   Procedure: LAPAROSCOPIC GASTRIC SLEEVE RESECTION UPPER ENDOSCOPY;  Surgeon: Johnathan Hausen, MD;  Location: [facility];  Service: General;  Laterality: N/A;   [place], 2001   x2   Social History   Social History Narrative   Not on file   Immunization History  Administered Date(s) Administered   Influenza,inj,Quad PF,6+ Mos 05/27/2016, 06/27/2017,  06/04/2020, 06/02/2022   Influenza-Unspecified 07/02/2013, 07/26/2014, 06/28/2019   Janssen (J&J) SARS-COV-2 Vaccination 01/02/2020   Tdap 03/21/2017   Zoster Recombinat (Shingrix) 06/04/2020, 09/16/2020     Objective: Vital Signs: BP 137/82 (BP Location: Right Arm, Patient Position: Sitting, Cuff Size: Normal)   Pulse 83   Resp 14   Ht 5\' 1"  (1.549 m)   Wt 146 lb (66.2 kg)   BMI 27.59 kg/m    Physical Exam HENT:     Mouth/Throat:     Mouth: Mucous membranes are moist.     Pharynx: Oropharynx is clear.  Eyes:     Conjunctiva/sclera: Conjunctivae normal.  Cardiovascular:     Rate and Rhythm: Normal rate and regular rhythm.  Pulmonary:     Effort: Pulmonary effort is normal.     Breath sounds: Normal breath sounds.  Musculoskeletal:     Right lower leg: No edema.     Left lower leg: No edema.  Lymphadenopathy:     Cervical: No cervical adenopathy.  Skin:    General: Skin is warm and dry.  Findings: No rash.  Neurological:     Mental Status: She is alert.  Psychiatric:        Mood and Affect: Mood normal.      Musculoskeletal Exam:  Shoulders full ROM no tenderness or swelling Elbows full ROM no tenderness or swelling Wrists full ROM no tenderness or swelling Fingers full ROM no tenderness or swelling No midline or paraspinal back tenderness to pressure, left hip tenderness to pressure laterally and some pain with internal rotation Knees full ROM no tenderness or swelling Ankles full ROM no tenderness or swelling Negative for MTP squeeze tenderness  Investigation: No additional findings.  Imaging: No results found.  Recent Labs: Lab Results  Component Value Date   WBC 6.8 04/19/2022   HGB 10.9 (L) 04/19/2022   PLT 623 (H) 04/19/2022   NA 141 04/15/2022   K 4.2 04/15/2022   CL 106 04/15/2022   CO2 25 04/15/2022   GLUCOSE 84 04/15/2022   BUN 8 04/15/2022   CREATININE 0.59 04/15/2022   BILITOT 0.5 04/15/2022   ALKPHOS 74 09/30/2021   AST 18  04/15/2022   ALT 37 (H) 04/15/2022   PROT 7.0 04/15/2022   ALBUMIN 4.6 09/30/2021   CALCIUM 9.4 04/15/2022   GFRAA 111 03/25/2020    Speciality Comments: No specialty comments available.  Procedures:  No procedures performed Allergies: Patient has no known allergies.   Assessment / Plan:     Visit Diagnoses: Positive anti-CCP test - Plan: Cyclic citrul peptide antibody, IgG, 14-3-3 eta Protein, Sedimentation rate, C-reactive protein, IgG, IgA, IgM  Somewhat low positive anti-CCP antibody titer and no overt synovitis of peripheral joints on exam today so difficult to confirm the diagnosis of seropositive rheumatoid arthritis.  Will recheck CCP antibody titer expect this to trend upward in true autoimmune disease setting we will also check sedimentation rate CRP and serum immunoglobulins for evidence of ongoing inflammatory activity despite resolution of the neurologic symptoms.  Sjogren's syndrome with keratoconjunctivitis sicca (HCC)  On symptomatic treatment with her eye Dr. Jackquline Bosch currently lubricating drops and xiidra maintenance prescriptions.  No history of scleritis uveitis no complications of oral disease.  Strong overlap with seropositive RA and low threshold to treat, presentation is not typical for neurologic involvement of Sjogren's.  Arthralgia, unspecified joint   Several areas of joint pain but no definite inflammation workup as detailed above.  Orders: Orders Placed This Encounter  Procedures   Cyclic citrul peptide antibody, IgG   14-3-3 eta Protein   Sedimentation rate   C-reactive protein   IgG, IgA, IgM   No orders of the defined types were placed in this encounter.    Follow-Up Instructions: Return in about 3 weeks (around 08/12/2022) for New pt ?RA f/u 3wks.   Fuller Plan, MD  Note - This record has been created using [employer].  Chart creation errors have been sought, but may not always  have been located. Such creation errors do  not reflect on  the standard of medical care.

## 2022-07-30 LAB — IGG, IGA, IGM
IgG (Immunoglobin G), Serum: 1365 mg/dL (ref 600–1640)
IgM, Serum: 44 mg/dL — ABNORMAL LOW (ref 50–300)
Immunoglobulin A: 178 mg/dL (ref 47–310)

## 2022-07-30 LAB — C-REACTIVE PROTEIN: CRP: 0.3 mg/L (ref <8.0)

## 2022-07-30 LAB — SEDIMENTATION RATE: Sed Rate: 6 mm/h (ref 0–30)

## 2022-07-30 LAB — 14-3-3 ETA PROTEIN: 14-3-3 eta Protein: <0.2 ng/mL (ref <0.2)

## 2022-07-30 LAB — CYCLIC CITRUL PEPTIDE ANTIBODY, IGG: Cyclic Citrullin Peptide Ab: 103 UNITS — ABNORMAL HIGH

## 2022-08-12 ENCOUNTER — Other Ambulatory Visit (HOSPITAL_COMMUNITY): Payer: Self-pay

## 2022-08-12 DIAGNOSIS — H16223 Keratoconjunctivitis sicca, not specified as Sjogren's, bilateral: Secondary | ICD-10-CM | POA: Diagnosis not present

## 2022-08-12 DIAGNOSIS — H179 Unspecified corneal scar and opacity: Secondary | ICD-10-CM | POA: Diagnosis not present

## 2022-08-12 DIAGNOSIS — H524 Presbyopia: Secondary | ICD-10-CM | POA: Diagnosis not present

## 2022-08-12 MED ORDER — XIIDRA 5 % OP SOLN
1.0000 [drp] | Freq: Two times a day (BID) | OPHTHALMIC | 11 refills | Status: DC
  Filled 2022-12-10: qty 180, 90d supply, fill #0

## 2022-08-12 MED ORDER — TOBRAMYCIN 0.3 % OP SOLN
1.0000 [drp] | Freq: Four times a day (QID) | OPHTHALMIC | 0 refills | Status: DC
  Filled 2022-08-12: qty 5, 25d supply, fill #0

## 2022-08-25 ENCOUNTER — Ambulatory Visit: Payer: 59 | Attending: Internal Medicine | Admitting: Internal Medicine

## 2022-08-25 ENCOUNTER — Other Ambulatory Visit (HOSPITAL_COMMUNITY): Payer: Self-pay

## 2022-08-25 ENCOUNTER — Encounter: Payer: Self-pay | Admitting: Internal Medicine

## 2022-08-25 VITALS — BP 132/78 | HR 73 | Resp 15 | Ht 61.0 in | Wt 145.0 lb

## 2022-08-25 DIAGNOSIS — M3501 Sicca syndrome with keratoconjunctivitis: Secondary | ICD-10-CM

## 2022-08-25 DIAGNOSIS — M059 Rheumatoid arthritis with rheumatoid factor, unspecified: Secondary | ICD-10-CM | POA: Diagnosis not present

## 2022-08-25 MED ORDER — HYDROXYCHLOROQUINE SULFATE 200 MG PO TABS
200.0000 mg | ORAL_TABLET | Freq: Every day | ORAL | 2 refills | Status: DC
  Filled 2022-08-25: qty 30, 30d supply, fill #0
  Filled 2022-09-15 – 2022-09-20 (×2): qty 30, 30d supply, fill #1
  Filled 2022-10-20: qty 30, 30d supply, fill #2

## 2022-08-25 NOTE — Progress Notes (Signed)
Office Visit Note  Patient: Kristine Stevens             Date of Birth: Dec 21, 1965           MRN: [id number]             PCP: Everrett Coombe, DO Referring: Everrett Coombe, DO Visit Date: 08/25/2022   Subjective:  Follow-up (Doing good)   History of Present Illness: Kristine Stevens is a 56 y.o. female here for follow up for sjogren syndrome and suspected seropositive RA. Lab evaluation at initial visit showing increased CCP Ab titer but normal inflammatory markers. She continues to experience bilateral hand stiffness and swelling most severe in the morning. Also having pain at the left great toe and schedule to see orthopedic surgery clinic for this. She still feels fatigued similar as before. She had recent ophthalmology follow up and left eye amniotic membrane procedure for dry eye.  Previous HPI Kristine Stevens is a 56 y.o. female here for evaluation of fevers and pain associated with positive CCP Abs and history of sjogren syndrome. She was recently hospitalized with SIRS and suspected infection but no organism identified also concerned for underlying autoimmune process. She completed a course of doxycycline for possible tickborne illness. CMV testing was positive for IgG and IgM. Ferritin low at 11 with findings consistent for IDA.    Symptoms started with severe low back pain then progressed to whole spine and headache and fevers. After one week hospitalization symptoms improved and has returned mostly to baseline. Still has pain in the low back more frequently than before. Notices left hip pain pretty regularly and popping or cracking sensation in her thoracic spine. Stiffness and pain commonly in both feet this lasts for about 1 hour also gets elbows and hands for 30-60 minutes. She takes ibuprofen rarely due to previous bariatric surgery. Takes gabapentin 600 mg at night when she is not working which helps partially.   She has a history of gastric sleeve surgery and has some chronic diarrhea  but this was more related to sphincter of Oddi dysfunction before this too. She had 3 left knee surgeries she had meniscal injury there as a teenager. She has a family history of RA in her mother who had deforming disease involving both hands.   Labs reviewed 03/2022 ANA neg RF neg CCP 81 Ferritin 11 Lyme neg Erlichiosis IgM equivocal CMV IgG pos IgM pos   Review of Systems  Constitutional:  Positive for fatigue.  HENT:  Positive for mouth sores and mouth dryness.   Eyes:  Positive for dryness.  Respiratory:  Negative for shortness of breath.   Cardiovascular:  Negative for chest pain and palpitations.  Gastrointestinal:  Positive for constipation. Negative for blood in stool and diarrhea.  Endocrine: Negative for increased urination.  Genitourinary:  Negative for involuntary urination.  Musculoskeletal:  Positive for joint pain, joint pain, joint swelling, muscle weakness, morning stiffness and muscle tenderness. Negative for gait problem, myalgias and myalgias.  Skin:  Positive for hair loss. Negative for color change, rash and sensitivity to sunlight.  Allergic/Immunologic: Positive for susceptible to infections.  Neurological:  Negative for dizziness and headaches.  Hematological:  Negative for swollen glands.  Psychiatric/Behavioral:  Positive for sleep disturbance. Negative for depressed mood. The patient is not nervous/anxious.     PMFS History:  Patient Active Problem List   Diagnosis Date Noted   Seropositive rheumatoid arthritis (HCC) 07/22/2022   Joint pain 06/02/2022   FUO (fever  of unknown origin) 04/19/2022   Sjogren's syndrome (HCC) 04/03/2022   Rosacea 02/04/2022   Well adult exam 02/04/2022   Essential hypertension 09/27/2021   Insomnia secondary to depression with anxiety 09/27/2021   Tick bite 01/25/2021   Statin intolerance 04/12/2019   Sicca syndrome, unspecified 02/05/2019   Elevated transaminase measurement 11/02/2017   History of high cholesterol  05/27/2016   History of gestational diabetes 05/27/2016   History of acute pancreatitis 05/27/2016   Prediabetes 04/17/2015   Status post laparoscopic sleeve gastrectomy June 2016 03/25/2015   Hyperlipemia 01/02/2015   Dysfunction of sphincter of Oddi 08/16/2013    Past Medical History:  Diagnosis Date   GERD (gastroesophageal reflux disease)    Heartburn    Hyperlipidemia    Hypertension    IBS (irritable bowel syndrome)    PONV (postoperative nausea and vomiting)    severe   Prediabetes    Rosacea    Sjogren's syndrome (HCC)    Sphincter of Oddi dysfunction     Family History  Problem Relation Age of Onset   Hypertension Mother    Hyperlipidemia Mother    Fibromyalgia Mother    Parkinson's disease Mother    Rheum arthritis Mother    Hyperlipidemia Father    Hypertension Father    Parkinson's disease Brother    Mental retardation Brother    Hyperlipidemia Brother    Breast cancer Maternal Grandmother 16   Healthy Daughter    Breast cancer Cousin 45   Colon cancer Neg Hx    Esophageal cancer Neg Hx    Rectal cancer Neg Hx    Stomach cancer Neg Hx    Past Surgical History:  Procedure Laterality Date   ABDOMINOPLASTY  05/25/2012   CESAREAN SECTION  1991, 1998   x2   CHOLECYSTECTOMY  1995   IRRIGATION AND DEBRIDEMENT ABSCESS Right 05/22/2013   Procedure: MINOR INCISION AND DRAINAGE OF ABSCESS;  Surgeon: Nicki Reaper, MD;  Location: [facility];  Service: Orthopedics;  Laterality: Right;   KNEE ARTHROSCOPY Left    x3   LAPAROSCOPIC GASTRIC SLEEVE RESECTION N/A 03/25/2015   Procedure: LAPAROSCOPIC GASTRIC SLEEVE RESECTION UPPER ENDOSCOPY;  Surgeon: Luretha Murphy, MD;  Location: [facility];  Service: General;  Laterality: N/A;   VAGINAL BIRTH AFTER CESAREAN SECTION  1993, 2001   x2   Social History   Social History Narrative   Not on file   Immunization History  Administered Date(s) Administered   Influenza,inj,Quad PF,6+ Mos 05/27/2016, 06/27/2017,  06/04/2020, 06/02/2022   Influenza-Unspecified 07/02/2013, 07/26/2014, 06/28/2019   Janssen (J&J) SARS-COV-2 Vaccination 01/02/2020   Tdap 03/21/2017   Zoster Recombinat (Shingrix) 06/04/2020, 09/16/2020     Objective: Vital Signs: BP 132/78 (BP Location: Left Arm, Patient Position: Sitting, Cuff Size: Normal)   Pulse 73   Resp 15   Ht 5\' 1"  (1.549 m)   Wt 145 lb (65.8 kg)   BMI 27.40 kg/m    Physical Exam Eyes:     Conjunctiva/sclera: Conjunctivae normal.  Cardiovascular:     Rate and Rhythm: Normal rate and regular rhythm.  Pulmonary:     Effort: Pulmonary effort is normal.     Breath sounds: Normal breath sounds.  Musculoskeletal:     Right lower leg: No edema.     Left lower leg: No edema.  Skin:    General: Skin is warm and dry.     Findings: No rash.  Neurological:     Mental Status: She is alert.  Psychiatric:  Mood and Affect: Mood normal.      Musculoskeletal Exam:  Shoulders full ROM no tenderness or swelling Elbows full ROM no tenderness or swelling Wrists full ROM no tenderness or swelling Fingers full ROM no tenderness or swelling Knees full ROM no tenderness or swelling Ankles full ROM no tenderness or swelling MTPs full ROM 1st MTP bunion, minimal lateral deviation   CDAI Exam: CDAI Score: 4  Patient Global: 30 mm; Provider Global: 10 mm Swollen: 0 ; Tender: 0  Joint Exam 08/25/2022   All documented joints were normal     Investigation: No additional findings.  Imaging: No results found.  Recent Labs: Lab Results  Component Value Date   WBC 6.8 04/19/2022   HGB 10.9 (L) 04/19/2022   PLT 623 (H) 04/19/2022   NA 141 04/15/2022   K 4.2 04/15/2022   CL 106 04/15/2022   CO2 25 04/15/2022   GLUCOSE 84 04/15/2022   BUN 8 04/15/2022   CREATININE 0.59 04/15/2022   BILITOT 0.5 04/15/2022   ALKPHOS 74 09/30/2021   AST 18 04/15/2022   ALT 37 (H) 04/15/2022   PROT 7.0 04/15/2022   ALBUMIN 4.6 09/30/2021   CALCIUM 9.4 04/15/2022    GFRAA 111 03/25/2020    Speciality Comments: No specialty comments available.  Procedures:  No procedures performed Allergies: Patient has no known allergies.   Assessment / Plan:     Visit Diagnoses: Seropositive rheumatoid arthritis (HCC) - Plan: hydroxychloroquine (PLAQUENIL) 200 MG tablet  Still having some pain with swelling and morning stiffness in peripheral joints including bilateral hands and in her foot although there is known MTP joint degenerative issues.  Not sure if it is too soon to see clinical efficacy of hydroxychloroquine or if pain is not currently from active inflammation. Regardless with increasing seropositivity would recommend maintenance treatment on hydroxychloroquine 200 mg daily for the goal of avoiding recurrent aseptic meningitis.  Sjogren's syndrome with keratoconjunctivitis sicca (HCC)   Continue regular ophthalmology follow-up with topical drops and had recent procedure for amniotic membrane anticipate this on the other eye to for the keratoconjunctivitis.  Orders: No orders of the defined types were placed in this encounter.  Meds ordered this encounter  Medications   hydroxychloroquine (PLAQUENIL) 200 MG tablet    Sig: Take 1 tablet (200 mg total) by mouth daily.    Dispense:  30 tablet    Refill:  2     Follow-Up Instructions: Return in about 3 months (around 11/25/2022) for RA/pSS HCQ start f/u 29mos.   Fuller Plan, MD  Note - This record has been created using [employer].  Chart creation errors have been sought, but may not always  have been located. Such creation errors do not reflect on  the standard of medical care.

## 2022-08-25 NOTE — Patient Instructions (Signed)
Hydroxychloroquine Tablets What is this medication? HYDROXYCHLOROQUINE (hye drox ee KLOR oh kwin) treats autoimmune conditions, such as rheumatoid arthritis and lupus. It works by slowing down an overactive immune system. It may also be used to prevent and treat malaria. It works by killing the parasite that causes malaria. It belongs to a group of medications called DMARDs. This medicine may be used for other purposes; ask your health care provider or pharmacist if you have questions. COMMON BRAND NAME(S): Plaquenil, Quineprox What should I tell my care team before I take this medication? They need to know if you have any of these conditions: Diabetes Eye disease, vision problems Frequently drink alcohol G6PD deficiency Heart disease Irregular heartbeat or rhythm Kidney disease Liver disease Porphyria Psoriasis An unusual or allergic reaction to hydroxychloroquine, other medications, foods, dyes, or preservatives Pregnant or trying to get pregnant Breastfeeding How should I use this medication? Take this medication by mouth with water. Take it as directed on the prescription label. Do not cut, crush, or chew this medication. Swallow the tablets whole. Take it with food. Do not take it more than directed. Take all of this medication unless your care team tells you to stop it early. Keep taking it even if you think you are better. Take products with antacids in them at a different time of day than this medication. Take this medication 4 hours before or 4 hours after antacids. Talk to your care team if you have questions. Talk to your care team about the use of this medication in children. While this medication may be prescribed for selected conditions, precautions do apply. Overdosage: If you think you have taken too much of this medicine contact a poison control center or emergency room at once. NOTE: This medicine is only for you. Do not share this medicine with others. What if I miss a  dose? If you miss a dose, take it as soon as you can. If it is almost time for your next dose, take only that dose. Do not take double or extra doses. What may interact with this medication? Do not take this medication with any of the following: Cisapride Dronedarone Pimozide Thioridazine This medication may also interact with the following: Ampicillin Antacids Cimetidine Cyclosporine Digoxin Kaolin Medications for diabetes, such as insulin, glipizide, glyburide Medications for seizures, such as carbamazepine, phenobarbital, phenytoin Mefloquine Methotrexate Other medications that cause heart rhythm changes Praziquantel This list may not describe all possible interactions. Give your health care provider a list of all the medicines, herbs, non-prescription drugs, or dietary supplements you use. Also tell them if you smoke, drink alcohol, or use illegal drugs. Some items may interact with your medicine. What should I watch for while using this medication? Visit your care team for regular checks on your progress. Tell your care team if your symptoms do not start to get better or if they get worse. You may need blood work done while you are taking this medication. If you take other medications that can affect heart rhythm, you may need more testing. Talk to your care team if you have questions. Your vision may be tested before and during use of this medication. Tell your care team right away if you have any change in your eyesight. This medication may cause serious skin reactions. They can happen weeks to months after starting the medication. Contact your care team right away if you notice fevers or flu-like symptoms with a rash. The rash may be red or purple and then turn   into blisters or peeling of the skin. Or, you might notice a red rash with swelling of the face, lips or lymph nodes in your neck or under your arms. If you or your family notice any changes in your behavior, such as new or  worsening depression, thoughts of harming yourself, anxiety, or other unusual or disturbing thoughts, or memory loss, call your care team right away. What side effects may I notice from receiving this medication? Side effects that you should report to your care team as soon as possible: Allergic reactions--skin rash, itching, hives, swelling of the face, lips, tongue, or throat Aplastic anemia--unusual weakness or fatigue, dizziness, headache, trouble breathing, increased bleeding or bruising Change in vision Heart rhythm changes--fast or irregular heartbeat, dizziness, feeling faint or lightheaded, chest pain, trouble breathing Infection--fever, chills, cough, or sore throat Low blood sugar (hypoglycemia)--tremors or shaking, anxiety, sweating, cold or clammy skin, confusion, dizziness, rapid heartbeat Muscle injury--unusual weakness or fatigue, muscle pain, dark yellow or brown urine, decrease in amount of urine Pain, tingling, or numbness in the hands or feet Rash, fever, and swollen lymph nodes Redness, blistering, peeling, or loosening of the skin, including inside the mouth Thoughts of suicide or self-harm, worsening mood, or feelings of depression Unusual bruising or bleeding Side effects that usually do not require medical attention (report to your care team if they continue or are bothersome): Diarrhea Headache Nausea Stomach pain Vomiting This list may not describe all possible side effects. Call your doctor for medical advice about side effects. You may report side effects to FDA at 1-800-FDA-1088. Where should I keep my medication? Keep out of the reach of children and pets. Store at room temperature up to 30 degrees C (86 degrees F). Protect from light. Get rid of any unused medication after the expiration date. To get rid of medications that are no longer needed or have expired: Take the medication to a medication take-back program. Check with your pharmacy or law enforcement  to find a location. If you cannot return the medication, check the label or package insert to see if the medication should be thrown out in the garbage or flushed down the toilet. If you are not sure, ask your care team. If it is safe to put it in the trash, empty the medication out of the container. Mix the medication with cat litter, dirt, coffee grounds, or other unwanted substance. Seal the mixture in a bag or container. Put it in the trash. NOTE: This sheet is a summary. It may not cover all possible information. If you have questions about this medicine, talk to your doctor, pharmacist, or health care provider.  2023 Elsevier/Gold Standard (2004-11-24 00:00:00)  

## 2022-08-26 DIAGNOSIS — M79672 Pain in left foot: Secondary | ICD-10-CM | POA: Diagnosis not present

## 2022-08-26 DIAGNOSIS — M2022 Hallux rigidus, left foot: Secondary | ICD-10-CM | POA: Diagnosis not present

## 2022-08-30 ENCOUNTER — Other Ambulatory Visit (HOSPITAL_COMMUNITY): Payer: Self-pay

## 2022-09-01 DIAGNOSIS — H16143 Punctate keratitis, bilateral: Secondary | ICD-10-CM | POA: Diagnosis not present

## 2022-09-08 ENCOUNTER — Other Ambulatory Visit (HOSPITAL_COMMUNITY): Payer: Self-pay

## 2022-09-15 ENCOUNTER — Other Ambulatory Visit: Payer: Self-pay

## 2022-09-15 ENCOUNTER — Encounter: Payer: Self-pay | Admitting: Family Medicine

## 2022-09-16 ENCOUNTER — Other Ambulatory Visit: Payer: Self-pay | Admitting: Family Medicine

## 2022-09-16 DIAGNOSIS — R7401 Elevation of levels of liver transaminase levels: Secondary | ICD-10-CM

## 2022-09-16 DIAGNOSIS — I10 Essential (primary) hypertension: Secondary | ICD-10-CM

## 2022-09-23 ENCOUNTER — Telehealth: Payer: 59 | Admitting: Physician Assistant

## 2022-09-23 ENCOUNTER — Other Ambulatory Visit: Payer: Self-pay | Admitting: Physician Assistant

## 2022-09-23 ENCOUNTER — Other Ambulatory Visit (HOSPITAL_COMMUNITY): Payer: Self-pay

## 2022-09-23 DIAGNOSIS — M79606 Pain in leg, unspecified: Secondary | ICD-10-CM | POA: Diagnosis not present

## 2022-09-23 DIAGNOSIS — R6889 Other general symptoms and signs: Secondary | ICD-10-CM

## 2022-09-23 DIAGNOSIS — M791 Myalgia, unspecified site: Secondary | ICD-10-CM | POA: Diagnosis not present

## 2022-09-23 DIAGNOSIS — R059 Cough, unspecified: Secondary | ICD-10-CM | POA: Diagnosis not present

## 2022-09-23 DIAGNOSIS — R509 Fever, unspecified: Secondary | ICD-10-CM

## 2022-09-23 DIAGNOSIS — M549 Dorsalgia, unspecified: Secondary | ICD-10-CM | POA: Diagnosis not present

## 2022-09-23 DIAGNOSIS — R519 Headache, unspecified: Secondary | ICD-10-CM

## 2022-09-23 MED ORDER — OSELTAMIVIR PHOSPHATE 75 MG PO CAPS
75.0000 mg | ORAL_CAPSULE | Freq: Two times a day (BID) | ORAL | 0 refills | Status: DC
  Filled 2022-09-23: qty 10, 5d supply, fill #0

## 2022-09-23 MED ORDER — XOFLUZA (40 MG DOSE) 2 X 20 MG PO TBPK
40.0000 mg | ORAL_TABLET | Freq: Once | ORAL | 0 refills | Status: DC
  Filled 2022-09-23: qty 1, 1d supply, fill #0

## 2022-09-23 NOTE — Progress Notes (Signed)
E visit for Flu like symptoms   We are sorry that you are not feeling well.  Here is how we plan to help! Based on what you have shared with me it looks like you may have a respiratory virus that may be influenza.  Influenza or "the flu" is   an infection caused by a respiratory virus. The flu virus is highly contagious and persons who did not receive their yearly flu vaccination may "catch" the flu from close contact.  We have anti-viral medications to treat the viruses that cause this infection. They are not a "cure" and only shorten the course of the infection. These prescriptions are most effective when they are given within the first 2 days of "flu" symptoms. Antiviral medication are indicated if you have a high risk of complications from the flu. You should  also consider an antiviral medication if you are in close contact with someone who is at risk. These medications can help patients avoid complications from the flu  but have side effects that you should know. Possible side effects from Tamiflu or oseltamivir include nausea, vomiting, diarrhea, dizziness, headaches, eye redness, sleep problems or other respiratory symptoms. You should not take Tamiflu if you have an allergy to oseltamivir or any to the ingredients in Tamiflu.  Based upon your symptoms and potential risk factors Xofluza Chiropractor) 20mg  tabs (2 tablets) once by mouth (if you a weigh between  88 pounds and 176 pounds- total of 40mg )  ANYONE WHO HAS FLU SYMPTOMS SHOULD: Stay home. The flu is highly contagious and going out or to work exposes others! Be sure to drink plenty of fluids. Water is fine as well as fruit juices, sodas and electrolyte beverages. You may want to stay away from caffeine or alcohol. If you are nauseated, try taking small sips of liquids. How do you know if you are getting enough fluid? Your urine should be a pale yellow or almost colorless. Get rest. Taking a steamy shower or using a humidifier may help  nasal congestion and ease sore throat pain. Using a saline nasal spray works much the same way. Cough drops, hard candies and sore throat lozenges may ease your cough. Line up a caregiver. Have someone check on you regularly.   GET HELP RIGHT AWAY IF: You cannot keep down liquids or your medications. You become short of breath Your fell like you are going to pass out or loose consciousness. Your symptoms persist after you have completed your treatment plan MAKE SURE YOU  Understand these instructions. Will watch your condition. Will get help right away if you are not doing well or get worse.  Your e-visit answers were reviewed by a board certified advanced clinical practitioner to complete your personal care plan.  Depending on the condition, your plan could have included both over the counter or prescription medications.  If there is a problem please reply  once you have received a response from your provider.  Your safety is important to .  If you have drug allergies check your prescription carefully.    You can use MyChart to ask questions about today's visit, request a non-urgent call back, or ask for a work or school excuse for 24 hours related to this e-Visit. If it has been greater than 24 hours you will need to follow up with your provider, or enter a new e-Visit to address those concerns.  You will get an e-mail in the next two days asking about your experience.  I  hope that your e-visit has been valuable and will speed your recovery. Thank you for using e-visits.  I have spent 5 minutes in review of e-visit questionnaire, review and updating patient chart, medical decision making and response to patient.   Mar Daring, PA-C

## 2022-09-24 ENCOUNTER — Other Ambulatory Visit (HOSPITAL_COMMUNITY): Payer: Self-pay

## 2022-09-26 DIAGNOSIS — Z760 Encounter for issue of repeat prescription: Secondary | ICD-10-CM | POA: Diagnosis not present

## 2022-10-06 DIAGNOSIS — M2022 Hallux rigidus, left foot: Secondary | ICD-10-CM | POA: Diagnosis not present

## 2022-10-06 DIAGNOSIS — M21612 Bunion of left foot: Secondary | ICD-10-CM | POA: Diagnosis not present

## 2022-11-08 NOTE — Progress Notes (Signed)
Office Visit Note  Patient: Kristine Stevens             Date of Birth: November 21, 1965           MRN: [id number]             PCP: Luetta Nutting, DO Referring: Luetta Nutting, DO Visit Date: 11/09/2022   Subjective:   History of Present Illness: Kristine Stevens is a 57 y.o. female here for follow up for Sjogren syndrome and seropositive RA with intermittent fever and previous CNS inflammation after starting hydroxychloroquine 200 mg daily.  She had another flareup between Christmas and New Year's pretty debilitating spent the majority of 4 days in bed.  She only had low fevers during this time but also severe fatigue.  She is having fairly diffuse pain at nighttime disrupting her sleep more than half of the nights.  Gabapentin and low-dose tramadol have been helpful for sleeping through the night.  May be see some benefit with muscle relaxer medication but avoid this due to getting a drowsiness or hangover effect into the following day.  Not seeing any obvious peripheral joint swelling.  So far she is not sure whether hydroxychloroquine is making much difference although the febrile episode was much less severe compared to the that landed her in the hospital last year.  Previous HPI 08/25/22 Kristine Stevens is a 57 y.o. female here for follow up for sjogren syndrome and suspected seropositive RA. Lab evaluation at initial visit showing increased CCP Ab titer but normal inflammatory markers. She continues to experience bilateral hand stiffness and swelling most severe in the morning. Also having pain at the left great toe and schedule to see orthopedic surgery clinic for this. She still feels fatigued similar as before. She had recent ophthalmology follow up and left eye amniotic membrane procedure for dry eye.   Previous HPI Kristine Stevens is a 57 y.o. female here for evaluation of fevers and pain associated with positive CCP Abs and history of sjogren syndrome. She was recently hospitalized with SIRS and  suspected infection but no organism identified also concerned for underlying autoimmune process. She completed a course of doxycycline for possible tickborne illness. CMV testing was positive for IgG and IgM. Ferritin low at 11 with findings consistent for IDA.    Symptoms started with severe low back pain then progressed to whole spine and headache and fevers. After one week hospitalization symptoms improved and has returned mostly to baseline. Still has pain in the low back more frequently than before. Notices left hip pain pretty regularly and popping or cracking sensation in her thoracic spine. Stiffness and pain commonly in both feet this lasts for about 1 hour also gets elbows and hands for 30-60 minutes. She takes ibuprofen rarely due to previous bariatric surgery. Takes gabapentin 600 mg at night when she is not working which helps partially.   She has a history of gastric sleeve surgery and has some chronic diarrhea but this was more related to sphincter of Oddi dysfunction before this too. She had 3 left knee surgeries she had meniscal injury there as a teenager. She has a family history of RA in her mother who had deforming disease involving both hands.   Labs reviewed 03/2022 ANA neg RF neg CCP 81 Ferritin 11 Lyme neg Erlichiosis IgM equivocal CMV IgG pos IgM pos   Review of Systems  Constitutional:  Positive for fatigue.  HENT:  Positive for mouth sores and mouth dryness.  Eyes:  Positive for dryness.  Respiratory: Negative.  Negative for shortness of breath.   Cardiovascular: Negative.  Negative for chest pain and palpitations.  Gastrointestinal: Negative.  Negative for blood in stool, constipation and diarrhea.  Endocrine: Negative.  Negative for increased urination.  Genitourinary: Negative.  Negative for involuntary urination.  Musculoskeletal:  Positive for joint pain, joint pain, joint swelling, myalgias, morning stiffness, muscle tenderness and myalgias. Negative for  gait problem and muscle weakness.  Skin: Negative.  Negative for color change, rash, hair loss and sensitivity to sunlight.  Allergic/Immunologic: Positive for susceptible to infections.  Neurological: Negative.  Negative for dizziness and headaches.  Hematological: Negative.  Negative for swollen glands.  Psychiatric/Behavioral:  Positive for sleep disturbance. Negative for depressed mood. The patient is nervous/anxious.     PMFS History:  Patient Active Problem List   Diagnosis Date Noted   Seropositive rheumatoid arthritis ([place]) 07/22/2022   Joint pain 06/02/2022   FUO (fever of unknown origin) 04/19/2022   Sjogren's syndrome ([place]) 04/03/2022   Rosacea 02/04/2022   Well adult exam 02/04/2022   Essential hypertension 09/27/2021   Insomnia secondary to depression with anxiety 09/27/2021   Tick bite 01/25/2021   Statin intolerance 04/12/2019   Sicca syndrome, unspecified 02/05/2019   Elevated transaminase measurement 11/02/2017   History of high cholesterol 05/27/2016   History of gestational diabetes 05/27/2016   History of acute pancreatitis 05/27/2016   Prediabetes 04/17/2015   Status post laparoscopic sleeve gastrectomy June 2016 03/25/2015   Hyperlipemia 01/02/2015   Dysfunction of sphincter of Oddi 08/16/2013    Past Medical History:  Diagnosis Date   GERD (gastroesophageal reflux disease)    Heartburn    Hyperlipidemia    Hypertension    IBS (irritable bowel syndrome)    PONV (postoperative nausea and vomiting)    severe   Prediabetes    Rosacea    Sjogren's syndrome ([place])    Sphincter of Oddi dysfunction     Family History  Problem Relation Age of Onset   Hypertension Mother    Hyperlipidemia Mother    Fibromyalgia Mother    Parkinson's disease Mother    Rheum arthritis Mother    Hyperlipidemia Father    Hypertension Father    Parkinson's disease Brother    Mental retardation Brother    Hyperlipidemia Brother    Breast cancer Maternal Grandmother 106    Healthy Daughter    Breast cancer Cousin 35   Colon cancer Neg Hx    Esophageal cancer Neg Hx    Rectal cancer Neg Hx    Stomach cancer Neg Hx    Past Surgical History:  Procedure Laterality Date   ABDOMINOPLASTY  05/25/2012   CESAREAN SECTION  1991, 1998   x2   CHOLECYSTECTOMY  1995   IRRIGATION AND DEBRIDEMENT ABSCESS Right 05/22/2013   Procedure: MINOR INCISION AND DRAINAGE OF ABSCESS;  Surgeon: Wynonia Sours, MD;  Location: [facility];  Service: Orthopedics;  Laterality: Right;   KNEE ARTHROSCOPY Left    x3   LAPAROSCOPIC GASTRIC SLEEVE RESECTION N/A 03/25/2015   Procedure: LAPAROSCOPIC GASTRIC SLEEVE RESECTION UPPER ENDOSCOPY;  Surgeon: Johnathan Hausen, MD;  Location: [facility];  Service: General;  Laterality: N/A;   [place], 2001   x2   Social History   Social History Narrative   Not on file   Immunization History  Administered Date(s) Administered   Influenza,inj,Quad PF,6+ Mos 05/27/2016, 06/27/2017, 06/04/2020, 06/02/2022   Influenza-Unspecified 07/02/2013,  07/26/2014, 06/28/2019   Janssen (J&J) SARS-COV-2 Vaccination 01/02/2020   Tdap 03/21/2017   Zoster Recombinat (Shingrix) 06/04/2020, 09/16/2020     Objective: Vital Signs: BP 132/88 (BP Location: Left Arm, Patient Position: Sitting, Cuff Size: Normal)   Pulse 87   Resp 16   Ht '5\' 1"'$  (1.549 m)   Wt 148 lb 3.2 oz (67.2 kg)   BMI 28.00 kg/m    Physical Exam Eyes:     Conjunctiva/sclera: Conjunctivae normal.  Cardiovascular:     Rate and Rhythm: Normal rate and regular rhythm.  Pulmonary:     Effort: Pulmonary effort is normal.     Breath sounds: Normal breath sounds.  Lymphadenopathy:     Cervical: No cervical adenopathy.  Skin:    General: Skin is warm and dry.     Findings: No rash.  Neurological:     Mental Status: She is alert.  Psychiatric:        Mood and Affect: Mood normal.      Musculoskeletal Exam:  Shoulders full ROM no tenderness or  swelling Elbows full ROM no tenderness or swelling Wrists full ROM no tenderness or swelling Fingers full ROM no tenderness or swelling Knees full ROM no tenderness or swelling  Investigation: No additional findings.  Imaging: No results found.  Recent Labs: Lab Results  Component Value Date   WBC 6.8 04/19/2022   HGB 10.9 (L) 04/19/2022   PLT 623 (H) 04/19/2022   NA 141 04/15/2022   K 4.2 04/15/2022   CL 106 04/15/2022   CO2 25 04/15/2022   GLUCOSE 84 04/15/2022   BUN 8 04/15/2022   CREATININE 0.59 04/15/2022   BILITOT 0.5 04/15/2022   ALKPHOS 74 09/30/2021   AST 18 04/15/2022   ALT 37 (H) 04/15/2022   PROT 7.0 04/15/2022   ALBUMIN 4.6 09/30/2021   CALCIUM 9.4 04/15/2022   GFRAA 111 03/25/2020    Speciality Comments: No specialty comments available.  Procedures:  No procedures performed Allergies: Patient has no known allergies.   Assessment / Plan:     Visit Diagnoses: Seropositive rheumatoid arthritis ([place]) - Plan: hydroxychloroquine (PLAQUENIL) 200 MG tablet  Had another febrile episode still having a fair amount of diffuse joint pain especially bad at nighttime.  Recommend titrating hydroxychloroquine to 300 mg daily.  Will need to follow-up with ophthalmology for retinal toxicity screening.  FUO (fever of unknown origin)  Still having episodic fever without specific provocation.  Continuing to monitor.  Discussed FMLA documentation supporting intermittent leave of absence during infrequent debilitating episodes.  Sjogren's syndrome with keratoconjunctivitis sicca ([place]) - Plan: hydroxychloroquine (PLAQUENIL) 200 MG tablet  Ongoing dry eyes and mouth she has gotten some more sores no obvious lesions on exam today.  Is prescribed the xiidra 5% for a medicated eyedrop.  Orders: No orders of the defined types were placed in this encounter.  Meds ordered this encounter  Medications   hydroxychloroquine (PLAQUENIL) 200 MG tablet    Sig: Take 1&1/2 tablets  (300 mg total) by mouth daily.    Dispense:  135 tablet    Refill:  1     Follow-Up Instructions: Return in about 3 months (around 02/07/2023) for RA HCQ increased f/u 37mo.   CCollier Salina MD  Note - This record has been created using [employer]  Chart creation errors have been sought, but may not always  have been located. Such creation errors do not reflect on  the standard of medical care.

## 2022-11-09 ENCOUNTER — Other Ambulatory Visit (HOSPITAL_COMMUNITY): Payer: Self-pay

## 2022-11-09 ENCOUNTER — Encounter: Payer: Self-pay | Admitting: Internal Medicine

## 2022-11-09 ENCOUNTER — Ambulatory Visit: Payer: Commercial Managed Care - PPO | Attending: Internal Medicine | Admitting: Internal Medicine

## 2022-11-09 VITALS — BP 132/88 | HR 87 | Resp 16 | Ht 61.0 in | Wt 148.2 lb

## 2022-11-09 DIAGNOSIS — R509 Fever, unspecified: Secondary | ICD-10-CM

## 2022-11-09 DIAGNOSIS — M3501 Sicca syndrome with keratoconjunctivitis: Secondary | ICD-10-CM | POA: Diagnosis not present

## 2022-11-09 DIAGNOSIS — M059 Rheumatoid arthritis with rheumatoid factor, unspecified: Secondary | ICD-10-CM | POA: Diagnosis not present

## 2022-11-09 MED ORDER — HYDROXYCHLOROQUINE SULFATE 200 MG PO TABS
300.0000 mg | ORAL_TABLET | Freq: Every day | ORAL | 1 refills | Status: DC
  Filled 2022-11-09 – 2022-11-11 (×2): qty 135, 90d supply, fill #0
  Filled 2023-02-25: qty 135, 90d supply, fill #1

## 2022-11-11 ENCOUNTER — Other Ambulatory Visit (HOSPITAL_COMMUNITY): Payer: Self-pay

## 2022-11-19 ENCOUNTER — Other Ambulatory Visit: Payer: Self-pay | Admitting: Family Medicine

## 2022-11-23 ENCOUNTER — Other Ambulatory Visit (HOSPITAL_COMMUNITY): Payer: Self-pay

## 2022-11-23 MED ORDER — FINACEA 15 % EX FOAM
1.0000 | Freq: Two times a day (BID) | CUTANEOUS | 0 refills | Status: DC
  Filled 2022-11-23: qty 50, 25d supply, fill #0

## 2022-11-23 MED ORDER — TIZANIDINE HCL 2 MG PO TABS
2.0000 mg | ORAL_TABLET | Freq: Three times a day (TID) | ORAL | 0 refills | Status: DC | PRN
  Filled 2022-11-23: qty 30, 5d supply, fill #0

## 2022-12-09 DIAGNOSIS — Z719 Counseling, unspecified: Secondary | ICD-10-CM

## 2022-12-10 ENCOUNTER — Ambulatory Visit (INDEPENDENT_AMBULATORY_CARE_PROVIDER_SITE_OTHER): Payer: Self-pay | Admitting: Plastic Surgery

## 2022-12-10 ENCOUNTER — Encounter: Payer: Self-pay | Admitting: Plastic Surgery

## 2022-12-10 ENCOUNTER — Other Ambulatory Visit (HOSPITAL_COMMUNITY): Payer: Self-pay

## 2022-12-10 ENCOUNTER — Other Ambulatory Visit: Payer: Self-pay | Admitting: Family Medicine

## 2022-12-10 VITALS — BP 143/87 | HR 87

## 2022-12-10 DIAGNOSIS — F4321 Adjustment disorder with depressed mood: Secondary | ICD-10-CM

## 2022-12-10 DIAGNOSIS — M3501 Sicca syndrome with keratoconjunctivitis: Secondary | ICD-10-CM

## 2022-12-10 DIAGNOSIS — E785 Hyperlipidemia, unspecified: Secondary | ICD-10-CM

## 2022-12-10 DIAGNOSIS — L719 Rosacea, unspecified: Secondary | ICD-10-CM

## 2022-12-10 MED ORDER — CITALOPRAM HYDROBROMIDE 20 MG PO TABS
30.0000 mg | ORAL_TABLET | Freq: Every day | ORAL | 1 refills | Status: DC
  Filled 2022-12-10: qty 135, 90d supply, fill #0
  Filled 2023-03-23: qty 135, 90d supply, fill #1

## 2022-12-10 MED ORDER — GABAPENTIN 300 MG PO CAPS
600.0000 mg | ORAL_CAPSULE | Freq: Every evening | ORAL | 3 refills | Status: DC | PRN
  Filled 2022-12-10 – 2023-01-24 (×2): qty 180, 90d supply, fill #0
  Filled 2023-05-04: qty 180, 90d supply, fill #1
  Filled 2023-08-27 – 2023-09-16 (×2): qty 180, 90d supply, fill #2

## 2022-12-10 MED ORDER — LOSARTAN POTASSIUM-HCTZ 100-12.5 MG PO TABS
1.0000 | ORAL_TABLET | Freq: Every day | ORAL | 3 refills | Status: DC
  Filled 2022-12-10: qty 90, 90d supply, fill #0
  Filled 2023-03-23: qty 90, 90d supply, fill #1
  Filled 2023-07-24: qty 90, 90d supply, fill #2
  Filled 2023-10-25: qty 90, 90d supply, fill #3

## 2022-12-10 MED ORDER — ICOSAPENT ETHYL 1 G PO CAPS
2.0000 g | ORAL_CAPSULE | Freq: Two times a day (BID) | ORAL | 1 refills | Status: DC
  Filled 2022-12-10: qty 360, 90d supply, fill #0
  Filled 2023-04-22: qty 360, 90d supply, fill #1

## 2022-12-10 NOTE — Telephone Encounter (Signed)
Please contact the patient to schedule 6 month follow-up. °

## 2022-12-10 NOTE — Progress Notes (Signed)
Preoperative Dx: Rosacea of face  Postoperative Dx:  same  Procedure: laser to face  Anesthesia: none  Description of Procedure:  Risks and complications were explained to the patient. Consent was confirmed and signed. Eye protection was placed. Time out was called and all information was confirmed to be correct. The area  area was prepped with alcohol and wiped dry. The BBL laser was set at 590 and 515 nm at 6.5 J/cm2. The face was lasered. The patient tolerated the procedure well and there were no complications. The patient is to follow up in 4 weeks.

## 2022-12-13 ENCOUNTER — Other Ambulatory Visit: Payer: Self-pay

## 2022-12-14 ENCOUNTER — Encounter: Payer: Self-pay | Admitting: Family Medicine

## 2022-12-14 ENCOUNTER — Other Ambulatory Visit (HOSPITAL_COMMUNITY): Payer: Self-pay

## 2022-12-21 ENCOUNTER — Other Ambulatory Visit (HOSPITAL_COMMUNITY): Payer: Self-pay

## 2022-12-21 ENCOUNTER — Encounter: Payer: Self-pay | Admitting: Family Medicine

## 2022-12-21 ENCOUNTER — Ambulatory Visit: Payer: Commercial Managed Care - PPO | Admitting: Family Medicine

## 2022-12-21 VITALS — BP 127/79 | HR 89 | Ht 61.0 in | Wt 146.0 lb

## 2022-12-21 DIAGNOSIS — Z9189 Other specified personal risk factors, not elsewhere classified: Secondary | ICD-10-CM | POA: Insufficient documentation

## 2022-12-21 DIAGNOSIS — I10 Essential (primary) hypertension: Secondary | ICD-10-CM

## 2022-12-21 DIAGNOSIS — R7303 Prediabetes: Secondary | ICD-10-CM

## 2022-12-21 DIAGNOSIS — E785 Hyperlipidemia, unspecified: Secondary | ICD-10-CM

## 2022-12-21 DIAGNOSIS — M059 Rheumatoid arthritis with rheumatoid factor, unspecified: Secondary | ICD-10-CM

## 2022-12-21 MED ORDER — SEMAGLUTIDE-WEIGHT MANAGEMENT 0.5 MG/0.5ML ~~LOC~~ SOAJ
0.5000 mg | SUBCUTANEOUS | 0 refills | Status: AC
  Filled 2022-12-21 – 2023-01-27 (×5): qty 2, 28d supply, fill #0

## 2022-12-21 MED ORDER — SEMAGLUTIDE-WEIGHT MANAGEMENT 1 MG/0.5ML ~~LOC~~ SOAJ
1.0000 mg | SUBCUTANEOUS | 0 refills | Status: DC
  Filled 2022-12-21: qty 2, 28d supply, fill #0

## 2022-12-21 MED ORDER — TRAMADOL-ACETAMINOPHEN 37.5-325 MG PO TABS
1.0000 | ORAL_TABLET | Freq: Four times a day (QID) | ORAL | 0 refills | Status: DC | PRN
  Filled 2022-12-21: qty 30, 8d supply, fill #0

## 2022-12-21 NOTE — Assessment & Plan Note (Signed)
BP is well controlled at this time.  Continue current medication.   

## 2022-12-21 NOTE — Assessment & Plan Note (Signed)
She has multiple risk factors for cardiovascular disease including prediabetes, HTN and Dyslipidemia.  As Kristine Stevens now has indication for prevention of cardiovascular disease as well as helping to manage her blood glucose I think this medication would be of great benefit for her.  Adding 0.25mg  weekly and will titrate based on response and tolerance.

## 2022-12-21 NOTE — Assessment & Plan Note (Signed)
Seeing rheumatology.  Ultracet renewed.  Unable to utilize NSAIDs due to previous bariatric surgery.

## 2022-12-21 NOTE — Progress Notes (Signed)
Kristine Stevens - 57 y.o. female MRN [id number]  Date of birth: [birth year]  Subjective Chief Complaint  Patient presents with   Results    HPI Kristine Stevens is a 57 y.o. female here today for follow up of recent labs.   She was noted to have elevated lipids as well as elevation in her A1c.  A1c has increased from 5.8% to 6.1%.   She remains quite active and feels like diet is pretty good.  She is taking metformin daily.  Lipids are treated with vascepa, tolerating well.  BP remains well controlled with losartan/hctz.    She is not sleeping well due to pain in her spine. Her Sjogrens/RA do not seem well controlled with plaquenil at this time.  She is currently followed by rheumatology.   ROS:  A comprehensive ROS was completed and negative except as noted per HPI   Allergies  Allergen Reactions   Morphine Other (See Comments)    Past Medical History:  Diagnosis Date   GERD (gastroesophageal reflux disease)    Heartburn    Hyperlipidemia    Hypertension    IBS (irritable bowel syndrome)    PONV (postoperative nausea and vomiting)    severe   Prediabetes    Rosacea    Sjogren's syndrome ([facility])    Sphincter of Oddi dysfunction     Past Surgical History:  Procedure Laterality Date   ABDOMINOPLASTY  05/25/2012   CESAREAN SECTION  1991, 1998   x2   [place] Right 05/22/2013   Procedure: MINOR INCISION AND DRAINAGE OF ABSCESS;  Surgeon: Wynonia Sours, MD;  Location: [facility];  Service: Orthopedics;  Laterality: Right;   KNEE ARTHROSCOPY Left    x3   LAPAROSCOPIC GASTRIC SLEEVE RESECTION N/A 03/25/2015   Procedure: LAPAROSCOPIC GASTRIC SLEEVE RESECTION UPPER ENDOSCOPY;  Surgeon: Johnathan Hausen, MD;  Location: [facility];  Service: General;  Laterality: N/A;   [place], 2001   x2    Social History   Socioeconomic History   Marital status: Married    Spouse name: Not on file    Number of children: Not on file   Years of education: Not on file   Highest education level: Master's degree (e.g., MA, MS, MEng, MEd, MSW, MBA)  Occupational History   Not on file  Tobacco Use   Smoking status: Never    Passive exposure: Never   Smokeless tobacco: Never  Vaping Use   Vaping Use: Never used  Substance and Sexual Activity   Alcohol use: Yes    Comment: rare   Drug use: No   Sexual activity: Yes  Other Topics Concern   Not on file  Social History Narrative   Not on file   Social Determinants of Health   Financial Resource Strain: Low Risk  (12/20/2022)   Overall Financial Resource Strain (CARDIA)    Difficulty of Paying Living Expenses: Not hard at all  Food Insecurity: No Food Insecurity (12/20/2022)   Hunger Vital Sign    Worried About Running Out of Food in the Last Year: Never true    [place] in the Last Year: Never true  Transportation Needs: No Transportation Needs (12/20/2022)   PRAPARE - Hydrologist (Medical): No    Lack of Transportation (Non-Medical): No  Physical Activity: Sufficiently Active (12/20/2022)   Exercise Vital Sign    Days of  Exercise per Week: 6 days    Minutes of Exercise per Session: 50 min  Stress: No Stress Concern Present (12/20/2022)   [place]    Feeling of Stress : Only a little  Social Connections: Socially Integrated (12/20/2022)   Social Connection and Isolation Panel [NHANES]    Frequency of Communication with Friends and Family: More than three times a week    Frequency of Social Gatherings with Friends and Family: More than three times a week    Attends Religious Services: More than 4 times per year    Active Member of Clubs or Organizations: Yes    Attends Music therapist: More than 4 times per year    Marital Status: Married    Family History  Problem Relation Age of Onset   Hypertension Mother     Hyperlipidemia Mother    Fibromyalgia Mother    Parkinson's disease Mother    Rheum arthritis Mother    Hyperlipidemia Father    Hypertension Father    Parkinson's disease Brother    Mental retardation Brother    Hyperlipidemia Brother    Breast cancer Maternal Grandmother 56   Healthy Daughter    Breast cancer Cousin 53   Colon cancer Neg Hx    Esophageal cancer Neg Hx    Rectal cancer Neg Hx    Stomach cancer Neg Hx     Health Maintenance  Topic Date Due   Hepatitis C Screening  06/03/2023 (Originally [birth year]/1984)   HIV Screening  06/03/2023 (Originally [birth year]1981)   COVID-19 Vaccine (2 - 2023-24 season) 06/23/2023 (Originally 05/28/2022)   MAMMOGRAM  07/23/2023   PAP SMEAR-Modifier  04/11/2024   DTaP/Tdap/Td (2 - Td or Tdap) 03/22/2027   COLONOSCOPY (Pts 45-32yrs Insurance coverage will need to be confirmed)  05/26/2027   INFLUENZA VACCINE  Completed   Zoster Vaccines- Shingrix  Completed   HPV VACCINES  Aged Out     ----------------------------------------------------------------------------------------------------------------------------------------------------------------------------------------------------------------- Physical Exam BP 127/79 (BP Location: Left Arm, Patient Position: Sitting, Cuff Size: Normal)   Pulse 89   Ht 5\' 1"  (1.549 m)   Wt 146 lb (66.2 kg)   SpO2 100%   BMI 27.59 kg/m   Physical Exam Constitutional:      Appearance: Normal appearance.  Eyes:     General: No scleral icterus. Neurological:     Mental Status: She is alert.  Psychiatric:        Mood and Affect: Mood normal.        Behavior: Behavior normal.     ------------------------------------------------------------------------------------------------------------------------------------------------------------------------------------------------------------------- Assessment and Plan  At increased risk for cardiovascular disease She has multiple risk factors for cardiovascular  disease including prediabetes, HTN and Dyslipidemia.  As Mancel Parsons now has indication for prevention of cardiovascular disease as well as helping to manage her blood glucose I think this medication would be of great benefit for her.  Adding 0.25mg  weekly and will titrate based on response and tolerance.    Seropositive rheumatoid arthritis ([place]) Seeing rheumatology.  Ultracet renewed.  Unable to utilize NSAIDs due to previous bariatric surgery.    Essential hypertension BP is well controlled at this time.  Continue current medication.    Meds ordered this encounter  Medications   Semaglutide-Weight Management 0.5 MG/0.5ML SOAJ    Sig: Inject 0.5 mg into the skin once a week for 28 days.    Dispense:  2 mL    Refill:  0   Semaglutide-Weight Management 1 MG/0.5ML SOAJ  Sig: Inject 1 mg into the skin once a week for 28 days.    Dispense:  2 mL    Refill:  0   traMADol-acetaminophen (ULTRACET) 37.5-325 MG tablet    Sig: Take 1 tablet by mouth every 6 (six) hours as needed.    Dispense:  30 tablet    Refill:  0    No follow-ups on file.    This visit occurred during the SARS-CoV-2 public health emergency.  Safety protocols were in place, including screening questions prior to the visit, additional usage of staff PPE, and extensive cleaning of exam room while observing appropriate contact time as indicated for disinfecting solutions.

## 2022-12-23 ENCOUNTER — Other Ambulatory Visit (HOSPITAL_COMMUNITY): Payer: Self-pay

## 2023-01-06 ENCOUNTER — Other Ambulatory Visit: Payer: Self-pay

## 2023-01-06 ENCOUNTER — Encounter: Payer: Self-pay | Admitting: Family Medicine

## 2023-01-07 ENCOUNTER — Other Ambulatory Visit: Payer: Self-pay | Admitting: Family Medicine

## 2023-01-07 ENCOUNTER — Other Ambulatory Visit (HOSPITAL_COMMUNITY): Payer: Self-pay

## 2023-01-07 DIAGNOSIS — R7303 Prediabetes: Secondary | ICD-10-CM

## 2023-01-07 DIAGNOSIS — Z9189 Other specified personal risk factors, not elsewhere classified: Secondary | ICD-10-CM

## 2023-01-07 DIAGNOSIS — I10 Essential (primary) hypertension: Secondary | ICD-10-CM

## 2023-01-07 DIAGNOSIS — Z8632 Personal history of gestational diabetes: Secondary | ICD-10-CM

## 2023-01-07 MED ORDER — OZEMPIC (0.25 OR 0.5 MG/DOSE) 2 MG/3ML ~~LOC~~ SOPN
PEN_INJECTOR | SUBCUTANEOUS | 1 refills | Status: AC
  Filled 2023-01-07 – 2023-01-21 (×2): qty 3, 28d supply, fill #0
  Filled 2023-01-27 – 2023-02-07 (×2): qty 3, 30d supply, fill #0

## 2023-01-07 MED ORDER — FREESTYLE LIBRE 3 SENSOR MISC
2 refills | Status: DC
  Filled 2023-01-07: qty 2, 28d supply, fill #0
  Filled 2023-02-25: qty 2, 28d supply, fill #1
  Filled 2023-03-30: qty 2, 28d supply, fill #2

## 2023-01-07 MED ORDER — SEMAGLUTIDE (1 MG/DOSE) 4 MG/3ML ~~LOC~~ SOPN
1.0000 mg | PEN_INJECTOR | SUBCUTANEOUS | 1 refills | Status: DC
Start: 2023-01-07 — End: 2023-03-18
  Filled 2023-01-07 – 2023-01-27 (×3): qty 3, 28d supply, fill #0

## 2023-01-12 ENCOUNTER — Other Ambulatory Visit (HOSPITAL_COMMUNITY): Payer: Self-pay

## 2023-01-18 ENCOUNTER — Other Ambulatory Visit (HOSPITAL_COMMUNITY): Payer: Self-pay

## 2023-01-18 ENCOUNTER — Other Ambulatory Visit: Payer: Self-pay | Admitting: Family Medicine

## 2023-01-18 DIAGNOSIS — R7303 Prediabetes: Secondary | ICD-10-CM

## 2023-01-18 DIAGNOSIS — Z9189 Other specified personal risk factors, not elsewhere classified: Secondary | ICD-10-CM

## 2023-01-18 DIAGNOSIS — I10 Essential (primary) hypertension: Secondary | ICD-10-CM

## 2023-01-18 DIAGNOSIS — Z8632 Personal history of gestational diabetes: Secondary | ICD-10-CM

## 2023-01-19 ENCOUNTER — Other Ambulatory Visit (HOSPITAL_COMMUNITY): Payer: Self-pay

## 2023-01-20 ENCOUNTER — Other Ambulatory Visit: Payer: Self-pay

## 2023-01-20 ENCOUNTER — Other Ambulatory Visit (HOSPITAL_COMMUNITY): Payer: Self-pay

## 2023-01-20 ENCOUNTER — Encounter (HOSPITAL_COMMUNITY): Payer: Self-pay

## 2023-01-21 ENCOUNTER — Other Ambulatory Visit (HOSPITAL_COMMUNITY): Payer: Self-pay

## 2023-01-21 ENCOUNTER — Other Ambulatory Visit: Payer: Commercial Managed Care - PPO | Admitting: Plastic Surgery

## 2023-01-24 ENCOUNTER — Other Ambulatory Visit (HOSPITAL_COMMUNITY): Payer: Self-pay

## 2023-01-27 ENCOUNTER — Other Ambulatory Visit (HOSPITAL_COMMUNITY): Payer: Self-pay

## 2023-01-28 ENCOUNTER — Encounter: Payer: Self-pay | Admitting: Plastic Surgery

## 2023-01-28 ENCOUNTER — Ambulatory Visit (INDEPENDENT_AMBULATORY_CARE_PROVIDER_SITE_OTHER): Payer: Self-pay | Admitting: Plastic Surgery

## 2023-01-28 DIAGNOSIS — Z719 Counseling, unspecified: Secondary | ICD-10-CM

## 2023-01-28 NOTE — Progress Notes (Signed)
Preoperative Dx: Hyperpigmentation of face  Postoperative Dx:  same  Procedure: laser to face  Anesthesia: none  Description of Procedure:  Risks and complications were explained to the patient. Consent was confirmed and signed. Eye protection was placed. Time out was called and all information was confirmed to be correct. The area  area was prepped with alcohol and wiped dry. The BBL laser was set at 515 and 590 and 695 at 7 J/cm2. The face was lasered. The patient tolerated the procedure well and there were no complications. The patient is to follow up in 4 weeks.   Botulinum Toxin Procedure Note  Procedure: Cosmetic botulinum toxin   Pre-operative Diagnosis: Dynamic rhytides   Post-operative Diagnosis: Same  Complications:  None  Brief history: The patient desires botulinum toxin injection of her forehead. I discussed with the patient this proposed procedure of botulinum toxin injections, which is customized depending on the particular needs of the patient. It is performed on facial rhytids as a temporary correction. The alternatives were discussed with the patient. The risks were addressed including bleeding, scarring, infection, damage to deeper structures, asymmetry, and chronic pain, which may occur infrequently after a procedure. The individual's choice to undergo a surgical procedure is based on the comparison of risks to potential benefits. Other risks include unsatisfactory results, brow ptosis, eyelid ptosis, allergic reaction, temporary paralysis, which should go away with time, bruising, blurring disturbances and delayed healing. Botulinum toxin injections do not arrest the aging process or produce permanent tightening of the eyelid.  Operative intervention maybe necessary to maintain the results of a blepharoplasty or botulinum toxin. The patient understands and wishes to proceed.  Procedure: The area was prepped with alcohol and dried with a clean gauze. Using a clean  technique, the botulinum toxin was diluted with 1.25 cc of preservative-free normal saline which was slowly injected with an 18 gauge needle in a tuberculin syringes.  A 32 gauge needles were then used to inject the botulinum toxin. This mixture allow for an aliquot of 4 units per 0.1 cc in each injection site.    Subsequently the mixture was injected in the glabellar area. A total of 12 Units of botulinum toxin was used. No complications were noted. Light pressure was held for 5 minutes. She was instructed explicitly in post-operative care.  Botox LOT:  [phone]

## 2023-02-07 ENCOUNTER — Other Ambulatory Visit (HOSPITAL_COMMUNITY): Payer: Self-pay

## 2023-02-08 ENCOUNTER — Ambulatory Visit (INDEPENDENT_AMBULATORY_CARE_PROVIDER_SITE_OTHER): Payer: Commercial Managed Care - PPO

## 2023-02-08 ENCOUNTER — Other Ambulatory Visit: Payer: Self-pay | Admitting: Family Medicine

## 2023-02-08 ENCOUNTER — Encounter: Payer: Self-pay | Admitting: Internal Medicine

## 2023-02-08 ENCOUNTER — Ambulatory Visit: Payer: Commercial Managed Care - PPO | Attending: Internal Medicine | Admitting: Internal Medicine

## 2023-02-08 VITALS — BP 109/74 | HR 96 | Resp 14 | Ht 61.0 in | Wt 141.0 lb

## 2023-02-08 DIAGNOSIS — M549 Dorsalgia, unspecified: Secondary | ICD-10-CM

## 2023-02-08 DIAGNOSIS — M059 Rheumatoid arthritis with rheumatoid factor, unspecified: Secondary | ICD-10-CM

## 2023-02-08 DIAGNOSIS — M3501 Sicca syndrome with keratoconjunctivitis: Secondary | ICD-10-CM | POA: Diagnosis not present

## 2023-02-08 LAB — CBC WITH DIFFERENTIAL/PLATELET
Eosinophils Absolute: 99 cells/uL (ref 15–500)
Platelets: 330 10*3/uL (ref 140–400)
RBC: 4.48 10*6/uL (ref 3.80–5.10)
RDW: 14.5 % (ref 11.0–15.0)

## 2023-02-08 NOTE — Progress Notes (Signed)
Office Visit Note  Patient: Kristine Stevens             Date of Birth: [birth year]           MRN: [id number]             PCP: Everrett Coombe, DO Referring: Everrett Coombe, DO Visit Date: 02/08/2023   Subjective:  Follow-up   History of Present Illness: Kristine Stevens is a 57 y.o. female here for follow-up with Sjogren's syndrome seropositive RA characterized by intermittent fever and previous CNS inflammation on hydroxychloroquine 300 mg daily.  She has been doing worse for the past 3 to 4 weeks particularly with pain in her neck and upper back.  This pain is most bothersome at nighttime causing difficulty sleeping and some sleep fragmentation.  She was prescribed tizanidine and Ultracet for this but with only a limited response to the treatment.  In addition to the gabapentin 600 mg at night which she has been on previously.  Did not recall any specific injury or activity change to account for increased symptoms.  Does not have widespread increase of symptoms and no recurrence of fever or neurologic symptoms.  She does feel her fatigue has been worse but not sure if this is related to the sleep disruption.  Labs reviewed 11/09/22 Kristine Stevens is a 57 y.o. female here for follow up for Sjogren syndrome and seropositive RA with intermittent fever and previous CNS inflammation after starting hydroxychloroquine 200 mg daily.  She had another flareup between Christmas and New Year's pretty debilitating spent the majority of 4 days in bed.  She only had low fevers during this time but also severe fatigue.  She is having fairly diffuse pain at nighttime disrupting her sleep more than half of the nights.  Gabapentin and low-dose tramadol have been helpful for sleeping through the night.  May be see some benefit with muscle relaxer medication but avoid this due to getting a drowsiness or hangover effect into the following day.  Not seeing any obvious peripheral joint swelling.  So far she is not sure whether  hydroxychloroquine is making much difference although the febrile episode was much less severe compared to the that landed her in the hospital last year.   08/25/22 Kristine Stevens is a 57 y.o. female here for follow up for sjogren syndrome and suspected seropositive RA. Lab evaluation at initial visit showing increased CCP Ab titer but normal inflammatory markers. She continues to experience bilateral hand stiffness and swelling most severe in the morning. Also having pain at the left great toe and schedule to see orthopedic surgery clinic for this. She still feels fatigued similar as before. She had recent ophthalmology follow up and left eye amniotic membrane procedure for dry eye.   Kristine Stevens is a 57 y.o. female here for evaluation of fevers and pain associated with positive CCP Abs and history of sjogren syndrome. She was recently hospitalized with SIRS and suspected infection but no organism identified also concerned for underlying autoimmune process. She completed a course of doxycycline for possible tickborne illness. CMV testing was positive for IgG and IgM. Ferritin low at 11 with findings consistent for IDA.    Symptoms started with severe low back pain then progressed to whole spine and headache and fevers. After one week hospitalization symptoms improved and has returned mostly to baseline. Still has pain in the low back more frequently than before. Notices left hip pain pretty regularly and popping or  cracking sensation in her thoracic spine. Stiffness and pain commonly in both feet this lasts for about 1 hour also gets elbows and hands for 30-60 minutes. She takes ibuprofen rarely due to previous bariatric surgery. Takes gabapentin 600 mg at night when she is not working which helps partially.   She has a history of gastric sleeve surgery and has some chronic diarrhea but this was more related to sphincter of Oddi dysfunction before this too. She had 3 left knee surgeries she had meniscal  injury there as a teenager. She has a family history of RA in her mother who had deforming disease involving both hands.   Labs reviewed 03/2022 ANA neg RF neg CCP 81 Ferritin 11 Lyme neg Erlichiosis IgM equivocal CMV IgG pos IgM pos  Activities of Daily Living:  Patient reports morning stiffness for 1 hour.   Patient Reports nocturnal pain.  Difficulty dressing/grooming: Denies Difficulty climbing stairs: Denies Difficulty getting out of chair: Denies Difficulty using hands for taps, buttons, cutlery, and/or writing: Reports  Review of Systems  Constitutional:  Positive for fatigue.  HENT:  Positive for mouth sores and mouth dryness.   Eyes:  Positive for dryness.  Respiratory:  Negative for shortness of breath.   Cardiovascular:  Positive for palpitations. Negative for chest pain.  Gastrointestinal:  Positive for constipation. Negative for blood in stool and diarrhea.  Endocrine: Negative for increased urination.  Genitourinary:  Negative for involuntary urination.  Musculoskeletal:  Positive for joint pain, gait problem, joint pain, joint swelling, myalgias, morning stiffness, muscle tenderness and myalgias. Negative for muscle weakness.  Skin:  Positive for sensitivity to sunlight. Negative for color change, rash and hair loss.  Allergic/Immunologic: Negative for susceptible to infections.  Neurological:  Positive for dizziness. Negative for headaches.  Hematological:  Negative for swollen glands.  Psychiatric/Behavioral:  Positive for sleep disturbance. Negative for depressed mood. The patient is not nervous/anxious.     PMFS History:  Patient Active Problem List   Diagnosis Date Noted   Upper back pain 02/08/2023   At increased risk for cardiovascular disease 12/21/2022   Seropositive rheumatoid arthritis (HCC) 07/22/2022   Joint pain 06/02/2022   FUO (fever of unknown origin) 04/19/2022   Sjogren's syndrome (HCC) 04/03/2022   Rosacea 02/04/2022   Encounter for  counseling 02/04/2022   Essential hypertension 09/27/2021   Insomnia secondary to depression with anxiety 09/27/2021   Tick bite 01/25/2021   Statin intolerance 04/12/2019   Sicca syndrome, unspecified 02/05/2019   Elevated transaminase measurement 11/02/2017   History of high cholesterol 05/27/2016   History of gestational diabetes 05/27/2016   History of acute pancreatitis 05/27/2016   Prediabetes 04/17/2015   Status post laparoscopic sleeve gastrectomy June 2016 03/25/2015   Hyperlipemia 01/02/2015   Dysfunction of sphincter of Oddi 08/16/2013    Past Medical History:  Diagnosis Date   GERD (gastroesophageal reflux disease)    Heartburn    Hyperlipidemia    Hypertension    IBS (irritable bowel syndrome)    PONV (postoperative nausea and vomiting)    severe   Prediabetes    Rosacea    Sjogren's syndrome (HCC)    Sphincter of Oddi dysfunction     Family History  Problem Relation Age of Onset   Hypertension Mother    Hyperlipidemia Mother    Fibromyalgia Mother    Parkinson's disease Mother    Rheum arthritis Mother    Hyperlipidemia Father    Hypertension Father    Parkinson's disease Brother  Mental retardation Brother    Hyperlipidemia Brother    Breast cancer Maternal Grandmother 78   Healthy Daughter    Breast cancer Cousin 45   Colon cancer Neg Hx    Esophageal cancer Neg Hx    Rectal cancer Neg Hx    Stomach cancer Neg Hx    Past Surgical History:  Procedure Laterality Date   ABDOMINOPLASTY  05/25/2012   CESAREAN SECTION  1991, 1998   x2   CHOLECYSTECTOMY  1995   IRRIGATION AND DEBRIDEMENT ABSCESS Right 05/22/2013   Procedure: MINOR INCISION AND DRAINAGE OF ABSCESS;  Surgeon: Nicki Reaper, MD;  Location: [facility];  Service: Orthopedics;  Laterality: Right;   KNEE ARTHROSCOPY Left    x3   LAPAROSCOPIC GASTRIC SLEEVE RESECTION N/A 03/25/2015   Procedure: LAPAROSCOPIC GASTRIC SLEEVE RESECTION UPPER ENDOSCOPY;  Surgeon: Luretha Murphy,  MD;  Location: [facility];  Service: General;  Laterality: N/A;   VAGINAL BIRTH AFTER CESAREAN SECTION  1993, 2001   x2   Social History   Social History Narrative   Not on file   Immunization History  Administered Date(s) Administered   Influenza,inj,Quad PF,6+ Mos 05/27/2016, 06/27/2017, 06/04/2020, 06/02/2022   Influenza-Unspecified 07/02/2013, 07/26/2014, 06/28/2019   Janssen (J&J) SARS-COV-2 Vaccination 01/02/2020   Tdap 03/21/2017   Zoster Recombinat (Shingrix) 06/04/2020, 09/16/2020     Objective: Vital Signs: BP 109/74 (BP Location: Left Arm, Patient Position: Sitting, Cuff Size: Normal)   Pulse 96   Resp 14   Ht 5\' 1"  (1.549 m)   Wt 141 lb (64 kg)   BMI 26.64 kg/m    Physical Exam Cardiovascular:     Rate and Rhythm: Normal rate and regular rhythm.  Pulmonary:     Effort: Pulmonary effort is normal.     Breath sounds: Normal breath sounds.  Skin:    General: Skin is warm and dry.     Findings: No rash.  Neurological:     Mental Status: She is alert.  Psychiatric:        Mood and Affect: Mood normal.      Musculoskeletal Exam:  Shoulders full ROM no tenderness or swelling Elbows full ROM no tenderness or swelling Wrists full ROM no tenderness or swelling Fingers full ROM no tenderness or swelling Knees full ROM no tenderness or swelling   Investigation: No additional findings.  Imaging: XR Thoracic Spine 2 View  Result Date: 02/25/2023 X-ray thoracic spine 2 view AP and lateral There are slight anterior endplate osteophytes present in upper portion of the thoracic spine.  Few lateral osteophytes more advanced on right side than left around T7-T9.  There is slight dextroscoliosis with no specific level of severe vertebral body or disc height loss.  No fractures or acute appearing bony abnormality. Impression Mild multilevel degenerative changes with probable slight acquired scoliosis and lateral osteophytes, no ankylosis or erosive changes  XR Cervical  Spine 2 or 3 views  Result Date: 02/25/2023 X-ray cervical spine 2 views AP and lateral Small anterior calcification present inferior border of C4 and C5.  There appears to be slight retrolisthesis of C6 on C7 with some loss of disc height.  No acute appearing bony abnormality.  No ankylosis or visible erosions seen. Impression Mild multilevel degenerative changes most severe is at C6-C7 with disc height loss and spondylolisthesis.   Recent Labs: Lab Results  Component Value Date   WBC 5.8 02/08/2023   HGB 12.0 02/08/2023   PLT 330 02/08/2023   NA 140  02/08/2023   K 4.0 02/08/2023   CL 104 02/08/2023   CO2 29 02/08/2023   GLUCOSE 86 02/08/2023   BUN 14 02/08/2023   CREATININE 0.74 02/08/2023   BILITOT 0.5 04/15/2022   ALKPHOS 74 09/30/2021   AST 18 04/15/2022   ALT 37 (H) 04/15/2022   PROT 7.0 04/15/2022   ALBUMIN 4.6 09/30/2021   CALCIUM 9.2 02/08/2023   GFRAA 111 03/25/2020    Speciality Comments: No specialty comments available.  Procedures:  No procedures performed Allergies: Patient has no active allergies.   Assessment / Plan:     Visit Diagnoses: Seropositive rheumatoid arthritis (HCC) - Plan: Sedimentation rate, C-reactive protein, CBC with Differential/Platelet, BASIC METABOLIC PANEL WITH GFR  Peripheral joint inflammation appears to be well-controlled.  Neck pain does not appear consistent with inflammatory arthritis and structural changes are not in typical pattern for RA related damage.  Checking sed rate and CRP for disease activity monitoring.  Checking a CBC and BMP for medication monitoring continues of hydroxychloroquine.  Will need to get ophthalmology exam for monitoring.  Continue hydroxychloroquine 300 mg daily.  Sjogren's syndrome with keratoconjunctivitis sicca (HCC)  Symptoms stable.  Upper back pain - Plan: XR Cervical Spine 2 or 3 views, XR Thoracic Spine 2 View  Pain seems musculoskeletal checked x-rays in clinic today she does have multilevel  degenerative changes in the cervical spine though no acute appearing abnormality to suggest any injury.  She does not want to try taking any steroid medication previously does not tolerate these well and symptoms return immediately.  May benefit from seeing neurosurgical or other specialist about local treatment options.  Orders: Orders Placed This Encounter  Procedures   XR Cervical Spine 2 or 3 views   XR Thoracic Spine 2 View   Sedimentation rate   C-reactive protein   CBC with Differential/Platelet   BASIC METABOLIC PANEL WITH GFR   No orders of the defined types were placed in this encounter.    Follow-Up Instructions: Return in about 6 months (around 08/11/2023) for pSS/RA on HCQ f/u 6mos.   Fuller Plan, MD  Note - This record has been created using [employer].  Chart creation errors have been sought, but may not always  have been located. Such creation errors do not reflect on  the standard of medical care.

## 2023-02-09 ENCOUNTER — Other Ambulatory Visit (HOSPITAL_COMMUNITY): Payer: Self-pay

## 2023-02-09 LAB — CBC WITH DIFFERENTIAL/PLATELET
Absolute Monocytes: 423 cells/uL (ref 200–950)
Basophils Absolute: 12 cells/uL (ref 0–200)
Basophils Relative: 0.2 %
Eosinophils Relative: 1.7 %
HCT: 36 % (ref 35.0–45.0)
Hemoglobin: 12 g/dL (ref 11.7–15.5)
Lymphs Abs: 2001 cells/uL (ref 850–3900)
MCH: 26.8 pg — ABNORMAL LOW (ref 27.0–33.0)
MCHC: 33.3 g/dL (ref 32.0–36.0)
MCV: 80.4 fL (ref 80.0–100.0)
MPV: 9.1 fL (ref 7.5–12.5)
Monocytes Relative: 7.3 %
Neutro Abs: 3265 cells/uL (ref 1500–7800)
Neutrophils Relative %: 56.3 %
Total Lymphocyte: 34.5 %
WBC: 5.8 10*3/uL (ref 3.8–10.8)

## 2023-02-09 LAB — BASIC METABOLIC PANEL WITH GFR
BUN: 14 mg/dL (ref 7–25)
CO2: 29 mmol/L (ref 20–32)
Calcium: 9.2 mg/dL (ref 8.6–10.4)
Chloride: 104 mmol/L (ref 98–110)
Creat: 0.74 mg/dL (ref 0.50–1.03)
Glucose, Bld: 86 mg/dL (ref 65–99)
Potassium: 4 mmol/L (ref 3.5–5.3)
Sodium: 140 mmol/L (ref 135–146)
eGFR: 95 mL/min/{1.73_m2} (ref >=60)

## 2023-02-09 LAB — C-REACTIVE PROTEIN: CRP: <3 mg/L (ref <8.0)

## 2023-02-09 LAB — SEDIMENTATION RATE: Sed Rate: 6 mm/h (ref 0–30)

## 2023-02-09 MED ORDER — FINACEA 15 % EX FOAM
1.0000 | Freq: Two times a day (BID) | CUTANEOUS | 0 refills | Status: DC
  Filled 2023-02-09: qty 50, 25d supply, fill #0

## 2023-02-15 ENCOUNTER — Ambulatory Visit: Payer: Commercial Managed Care - PPO | Admitting: Internal Medicine

## 2023-02-25 ENCOUNTER — Other Ambulatory Visit: Payer: Self-pay | Admitting: Family Medicine

## 2023-02-25 ENCOUNTER — Other Ambulatory Visit: Payer: Self-pay | Admitting: Plastic Surgery

## 2023-02-26 ENCOUNTER — Other Ambulatory Visit: Payer: Self-pay

## 2023-02-28 ENCOUNTER — Other Ambulatory Visit (HOSPITAL_COMMUNITY): Payer: Self-pay

## 2023-02-28 ENCOUNTER — Other Ambulatory Visit: Payer: Self-pay

## 2023-02-28 MED ORDER — TIZANIDINE HCL 2 MG PO TABS
2.0000 mg | ORAL_TABLET | Freq: Three times a day (TID) | ORAL | 0 refills | Status: DC | PRN
  Filled 2023-02-28: qty 30, 5d supply, fill #0

## 2023-02-28 MED ORDER — TRAMADOL-ACETAMINOPHEN 37.5-325 MG PO TABS
1.0000 | ORAL_TABLET | Freq: Four times a day (QID) | ORAL | 0 refills | Status: DC | PRN
  Filled 2023-02-28: qty 30, 8d supply, fill #0

## 2023-02-28 NOTE — Telephone Encounter (Signed)
Tramadol-ace Last OV: 12/01/22 Next OV: 03/18/23 Last RF: 12/21/22

## 2023-03-04 ENCOUNTER — Other Ambulatory Visit: Payer: Self-pay | Admitting: Plastic Surgery

## 2023-03-04 ENCOUNTER — Other Ambulatory Visit (HOSPITAL_COMMUNITY): Payer: Self-pay

## 2023-03-10 ENCOUNTER — Ambulatory Visit (INDEPENDENT_AMBULATORY_CARE_PROVIDER_SITE_OTHER): Payer: Self-pay | Admitting: Surgical

## 2023-03-10 DIAGNOSIS — L719 Rosacea, unspecified: Secondary | ICD-10-CM

## 2023-03-10 DIAGNOSIS — Z719 Counseling, unspecified: Secondary | ICD-10-CM

## 2023-03-10 DIAGNOSIS — M3501 Sicca syndrome with keratoconjunctivitis: Secondary | ICD-10-CM

## 2023-03-10 NOTE — Progress Notes (Signed)
Preoperative Dx: hyperpigmentation/sun damage, rosacea, facial vessels  Postoperative Dx:  same  Procedure: laser to face   Anesthesia: none  Description of Procedure:  Risks and complications were explained to the patient. Consent was confirmed and signed. Time out was called and all information was confirmed to be correct. The area  area was prepped with alcohol and wiped dry.   The 590 nm laser was set at 5.5 J/cm2, 3 ms, 25 degrees C. Total of 184 passes The 560 nm laser was then set at 23 J/cm, 30 ms pulse width to target facial vessels.  The 560 nm filter was then set at 15 J/cm, 18 pulse width, 20 C temperature to target perioral rosacea and cheek rosacea  The 515 nm laser was then set at 9.5 J/cm, 25 degree pulse width 18 degrees temperature to target sun damage/hyperpigmentation  The patient tolerated the procedure well and there were no complications.   This was her final treatment of 3 treatments, discussed with patient to allow the area to heal and let us know if she feels as if she needs additional treatment.  She had the expected post procedure redness and hyperpigmentation.

## 2023-03-18 ENCOUNTER — Encounter: Payer: Self-pay | Admitting: Family Medicine

## 2023-03-18 ENCOUNTER — Other Ambulatory Visit (HOSPITAL_COMMUNITY): Payer: Self-pay

## 2023-03-18 ENCOUNTER — Ambulatory Visit: Payer: Commercial Managed Care - PPO | Admitting: Family Medicine

## 2023-03-18 VITALS — BP 132/79 | HR 73 | Ht 61.0 in | Wt 146.0 lb

## 2023-03-18 DIAGNOSIS — Z9189 Other specified personal risk factors, not elsewhere classified: Secondary | ICD-10-CM

## 2023-03-18 DIAGNOSIS — I10 Essential (primary) hypertension: Secondary | ICD-10-CM

## 2023-03-18 DIAGNOSIS — F5105 Insomnia due to other mental disorder: Secondary | ICD-10-CM

## 2023-03-18 DIAGNOSIS — F418 Other specified anxiety disorders: Secondary | ICD-10-CM | POA: Diagnosis not present

## 2023-03-18 DIAGNOSIS — E785 Hyperlipidemia, unspecified: Secondary | ICD-10-CM

## 2023-03-18 MED ORDER — SEMAGLUTIDE-WEIGHT MANAGEMENT 0.25 MG/0.5ML ~~LOC~~ SOAJ
0.2500 mg | SUBCUTANEOUS | 0 refills | Status: AC
Start: 2023-03-18 — End: 2023-04-15
  Filled 2023-03-18 – 2023-04-06 (×3): qty 2, 28d supply, fill #0

## 2023-03-18 MED ORDER — SEMAGLUTIDE-WEIGHT MANAGEMENT 0.5 MG/0.5ML ~~LOC~~ SOAJ
0.5000 mg | SUBCUTANEOUS | 0 refills | Status: AC
Start: 2023-04-16 — End: 2023-05-14
  Filled 2023-03-18: qty 2, 28d supply, fill #0

## 2023-03-18 MED ORDER — SEMAGLUTIDE-WEIGHT MANAGEMENT 1 MG/0.5ML ~~LOC~~ SOAJ
1.0000 mg | SUBCUTANEOUS | 0 refills | Status: AC
Start: 2023-05-15 — End: 2023-06-12
  Filled 2023-03-18: qty 2, 28d supply, fill #0
  Filled ????-??-??: fill #0

## 2023-03-18 MED ORDER — TRAZODONE HCL 50 MG PO TABS
50.0000 mg | ORAL_TABLET | Freq: Every evening | ORAL | 3 refills | Status: DC | PRN
  Filled 2023-03-18: qty 90, 45d supply, fill #0
  Filled 2023-07-24: qty 90, 45d supply, fill #1

## 2023-03-18 NOTE — Assessment & Plan Note (Signed)
Once again she has multiple risk factors for cardiovascular disease including prediabetes, hypertension and dyslipidemia.  She also has a strong family history of cardiovascular disease and I think she would benefit from addition of Wegovy to reduce her risk of developing cardiovascular disease.

## 2023-03-18 NOTE — Progress Notes (Signed)
Kristine Stevens - 57 y.o. female MRN [id number]  Date of birth: 03/19/1966  Subjective Chief Complaint  Patient presents with   Follow-up    HPI Kristine Stevens is a 57 y.o. female here today for follow up visit.   Overall she is doing pretty well.  She has multiple comorbidities that put her at increased risk for cardiovascular disease as well as a strong family history of cardiovascular disease.  We discussed that use of Wegovy would be beneficial to help diminish her risk of developing cardiovascular disease.  She does continue to try to stay active and feels like diet is pretty good most of the time.  She also is having some difficulty with sleep.  She had tried amitriptyline in the past but would prefer to try something different.  Does recall using trazodone at 1 point which she felt had a very quick onset but did not last her throughout the night.  She would be willing to try this again.  ROS:  A comprehensive ROS was completed and negative except as noted per HPI   No Known Allergies  Past Medical History:  Diagnosis Date   GERD (gastroesophageal reflux disease)    Heartburn    Hyperlipidemia    Hypertension    IBS (irritable bowel syndrome)    PONV (postoperative nausea and vomiting)    severe   Prediabetes    Rosacea    Sjogren's syndrome (HCC)    Sphincter of Oddi dysfunction     Past Surgical History:  Procedure Laterality Date   ABDOMINOPLASTY  05/25/2012   CESAREAN SECTION  1991, 1998   x2   CHOLECYSTECTOMY  1995   IRRIGATION AND DEBRIDEMENT ABSCESS Right 05/22/2013   Procedure: MINOR INCISION AND DRAINAGE OF ABSCESS;  Surgeon: Nicki Reaper, MD;  Location: [facility];  Service: Orthopedics;  Laterality: Right;   KNEE ARTHROSCOPY Left    x3   LAPAROSCOPIC GASTRIC SLEEVE RESECTION N/A 03/25/2015   Procedure: LAPAROSCOPIC GASTRIC SLEEVE RESECTION UPPER ENDOSCOPY;  Surgeon: Luretha Murphy, MD;  Location: [facility];  Service: General;  Laterality: N/A;    VAGINAL BIRTH AFTER CESAREAN SECTION  1993, 2001   x2    Social History   Socioeconomic History   Marital status: Married    Spouse name: Not on file   Number of children: Not on file   Years of education: Not on file   Highest education level: Master's degree (e.g., MA, MS, MEng, MEd, MSW, MBA)  Occupational History   Not on file  Tobacco Use   Smoking status: Never    Passive exposure: Never   Smokeless tobacco: Never  Vaping Use   Vaping Use: Never used  Substance and Sexual Activity   Alcohol use: Yes    Comment: rare   Drug use: No   Sexual activity: Yes  Other Topics Concern   Not on file  Social History Narrative   Not on file   Social Determinants of Health   Financial Resource Strain: Low Risk  (12/20/2022)   Overall Financial Resource Strain (CARDIA)    Difficulty of Paying Living Expenses: Not hard at all  Food Insecurity: No Food Insecurity (12/20/2022)   Hunger Vital Sign    Worried About Running Out of Food in the Last Year: Never true    Ran Out of Food in the Last Year: Never true  Transportation Needs: No Transportation Needs (12/20/2022)   PRAPARE - Administrator, Civil Service (Medical):  No    Lack of Transportation (Non-Medical): No  Physical Activity: Sufficiently Active (12/20/2022)   Exercise Vital Sign    Days of Exercise per Week: 6 days    Minutes of Exercise per Session: 50 min  Stress: No Stress Concern Present (12/20/2022)   [employer] of Occupational Health - Occupational Stress Questionnaire    Feeling of Stress : Only a little  Social Connections: Socially Integrated (12/20/2022)   Social Connection and Isolation Panel [NHANES]    Frequency of Communication with Friends and Family: More than three times a week    Frequency of Social Gatherings with Friends and Family: More than three times a week    Attends Religious Services: More than 4 times per year    Active Member of Clubs or Organizations: Yes    Attends Probation officer: More than 4 times per year    Marital Status: Married    Family History  Problem Relation Age of Onset   Hypertension Mother    Hyperlipidemia Mother    Fibromyalgia Mother    Parkinson's disease Mother    Rheum arthritis Mother    Hyperlipidemia Father    Hypertension Father    Parkinson's disease Brother    Mental retardation Brother    Hyperlipidemia Brother    Breast cancer Maternal Grandmother 57   Healthy Daughter    Breast cancer Cousin 45   Colon cancer Neg Hx    Esophageal cancer Neg Hx    Rectal cancer Neg Hx    Stomach cancer Neg Hx     Health Maintenance  Topic Date Due   Hepatitis C Screening  06/03/2023 (Originally 11/22/1983)   HIV Screening  06/03/2023 (Originally 11/22/1980)   COVID-19 Vaccine (2 - 2023-24 season) 06/23/2023 (Originally 05/28/2022)   INFLUENZA VACCINE  04/28/2023   MAMMOGRAM  07/23/2023   PAP SMEAR-Modifier  04/11/2024   DTaP/Tdap/Td (2 - Td or Tdap) 03/22/2027   Colonoscopy  05/26/2027   Zoster Vaccines- Shingrix  Completed   HPV VACCINES  Aged Out     ----------------------------------------------------------------------------------------------------------------------------------------------------------------------------------------------------------------- Physical Exam BP 132/79 (BP Location: Left Arm, Patient Position: Sitting, Cuff Size: Large)   Pulse 73   Ht 5\' 1"  (1.549 m)   Wt 146 lb (66.2 kg)   SpO2 100%   BMI 27.59 kg/m   Physical Exam Constitutional:      Appearance: Normal appearance.  HENT:     Head: Normocephalic and atraumatic.  Neurological:     Mental Status: She is alert.  Psychiatric:        Mood and Affect: Mood normal.        Behavior: Behavior normal.     ------------------------------------------------------------------------------------------------------------------------------------------------------------------------------------------------------------------- Assessment  and Plan  Insomnia secondary to depression with anxiety Previously taking Elavil but is now off of this at this point.  Adding trazodone 50 to 100 mg nightly..  At increased risk for cardiovascular disease Once again she has multiple risk factors for cardiovascular disease including prediabetes, hypertension and dyslipidemia.  She also has a strong family history of cardiovascular disease and I think she would benefit from addition of Wegovy to reduce her risk of developing cardiovascular disease.   Meds ordered this encounter  Medications   Semaglutide-Weight Management 0.25 MG/0.5ML SOAJ    Sig: Inject 0.25 mg into the skin once a week for 28 days.    Dispense:  2 mL    Refill:  0   Semaglutide-Weight Management 0.5 MG/0.5ML SOAJ    Sig: Inject 0.5 mg  into the skin once a week for 28 days.    Dispense:  2 mL    Refill:  0   Semaglutide-Weight Management 1 MG/0.5ML SOAJ    Sig: Inject 1 mg into the skin once a week for 28 days.    Dispense:  2 mL    Refill:  0   traZODone (DESYREL) 50 MG tablet    Sig: Take 1-2 tablets (50-100 mg total) by mouth at bedtime as needed for sleep.    Dispense:  90 tablet    Refill:  3    No follow-ups on file.    This visit occurred during the SARS-CoV-2 public health emergency.  Safety protocols were in place, including screening questions prior to the visit, additional usage of staff PPE, and extensive cleaning of exam room while observing appropriate contact time as indicated for disinfecting solutions.

## 2023-03-18 NOTE — Assessment & Plan Note (Signed)
Previously taking Elavil but is now off of this at this point.  Adding trazodone 50 to 100 mg nightly.Marland Kitchen

## 2023-03-23 ENCOUNTER — Other Ambulatory Visit (HOSPITAL_COMMUNITY): Payer: Self-pay

## 2023-03-23 ENCOUNTER — Other Ambulatory Visit: Payer: Self-pay

## 2023-03-30 ENCOUNTER — Other Ambulatory Visit (HOSPITAL_COMMUNITY): Payer: Self-pay

## 2023-03-30 ENCOUNTER — Other Ambulatory Visit: Payer: Self-pay | Admitting: Family Medicine

## 2023-03-30 ENCOUNTER — Other Ambulatory Visit: Payer: Self-pay

## 2023-03-30 MED ORDER — FINACEA 15 % EX FOAM
1.0000 | Freq: Two times a day (BID) | CUTANEOUS | 0 refills | Status: DC
  Filled 2023-03-30: qty 50, 25d supply, fill #0

## 2023-04-06 ENCOUNTER — Other Ambulatory Visit: Payer: Self-pay | Admitting: Family Medicine

## 2023-04-06 ENCOUNTER — Other Ambulatory Visit: Payer: Self-pay | Admitting: Internal Medicine

## 2023-04-06 ENCOUNTER — Other Ambulatory Visit (HOSPITAL_COMMUNITY): Payer: Self-pay

## 2023-04-06 DIAGNOSIS — M3501 Sicca syndrome with keratoconjunctivitis: Secondary | ICD-10-CM

## 2023-04-06 DIAGNOSIS — M059 Rheumatoid arthritis with rheumatoid factor, unspecified: Secondary | ICD-10-CM

## 2023-04-06 MED ORDER — METFORMIN HCL ER 500 MG PO TB24
1000.0000 mg | ORAL_TABLET | Freq: Every day | ORAL | 1 refills | Status: AC
  Filled 2023-04-18: qty 180, 90d supply, fill #0
  Filled 2023-09-07: qty 180, 90d supply, fill #1

## 2023-04-06 MED ORDER — HYDROXYCHLOROQUINE SULFATE 200 MG PO TABS
300.0000 mg | ORAL_TABLET | Freq: Every day | ORAL | 0 refills | Status: DC
Start: 2023-04-06 — End: 2023-09-07
  Filled 2023-06-17 – ????-??-?? (×2): qty 135, 90d supply, fill #0

## 2023-04-06 MED ORDER — FINACEA 15 % EX FOAM
1.0000 | Freq: Two times a day (BID) | CUTANEOUS | 0 refills | Status: DC
  Filled 2023-05-04: qty 50, 30d supply, fill #0
  Filled ????-??-??: qty 50, 25d supply, fill #0

## 2023-04-06 NOTE — Telephone Encounter (Signed)
Last Fill: 11/09/2022  Eye exam: not noted anywhere   Labs: 02/08/2023 Sed rate and CRP are normal.  CBC and BMP are unremarkable no problem for continuing current medications.   Next Visit: 08/11/2023  Last Visit: 02/08/2023  [id number] rheumatoid arthritis   Current Dose per office note 02/08/2023: hydroxychloroquine 300 mg daily.   Attempted to contact the patient regarding her PLQ eye exam and could not leave a message because the mailbox was full.   Okay to refill Plaquenil?

## 2023-04-08 ENCOUNTER — Telehealth: Payer: Self-pay

## 2023-04-08 NOTE — Telephone Encounter (Signed)
Initiated Prior authorization [id number] 0.25MG /0.5ML auto-injectors Via: Covermymeds Case/Key:[id number] Status: n/a as of 712/24 Reason:This drug/product is not covered under the pharmacy benefit. Prior Authorization is not available. Notified Pt via: Mychart

## 2023-04-18 ENCOUNTER — Other Ambulatory Visit (HOSPITAL_COMMUNITY): Payer: Self-pay

## 2023-04-18 ENCOUNTER — Other Ambulatory Visit: Payer: Self-pay

## 2023-05-04 ENCOUNTER — Other Ambulatory Visit (HOSPITAL_COMMUNITY): Payer: Self-pay

## 2023-05-11 ENCOUNTER — Other Ambulatory Visit (HOSPITAL_COMMUNITY): Payer: Self-pay

## 2023-05-31 ENCOUNTER — Other Ambulatory Visit: Payer: Self-pay | Admitting: Plastic Surgery

## 2023-06-17 ENCOUNTER — Other Ambulatory Visit: Payer: Self-pay

## 2023-06-17 ENCOUNTER — Other Ambulatory Visit: Payer: Self-pay | Admitting: Family Medicine

## 2023-06-17 ENCOUNTER — Other Ambulatory Visit (HOSPITAL_COMMUNITY): Payer: Self-pay

## 2023-06-17 DIAGNOSIS — F4321 Adjustment disorder with depressed mood: Secondary | ICD-10-CM

## 2023-06-17 MED ORDER — CITALOPRAM HYDROBROMIDE 20 MG PO TABS
30.0000 mg | ORAL_TABLET | Freq: Every day | ORAL | 1 refills | Status: DC
Start: 2023-06-17 — End: 2023-12-29
  Filled 2023-06-17: qty 135, 90d supply, fill #0
  Filled 2023-09-16: qty 135, 90d supply, fill #1

## 2023-07-01 DIAGNOSIS — M21612 Bunion of left foot: Secondary | ICD-10-CM | POA: Diagnosis not present

## 2023-07-01 DIAGNOSIS — M2022 Hallux rigidus, left foot: Secondary | ICD-10-CM | POA: Diagnosis not present

## 2023-07-04 ENCOUNTER — Encounter: Payer: Self-pay | Admitting: Family Medicine

## 2023-07-04 ENCOUNTER — Other Ambulatory Visit (HOSPITAL_COMMUNITY): Payer: Self-pay | Admitting: Orthopedic Surgery

## 2023-07-04 DIAGNOSIS — R7303 Prediabetes: Secondary | ICD-10-CM

## 2023-07-04 DIAGNOSIS — K7581 Nonalcoholic steatohepatitis (NASH): Secondary | ICD-10-CM

## 2023-07-04 DIAGNOSIS — E88819 Insulin resistance, unspecified: Secondary | ICD-10-CM

## 2023-07-06 ENCOUNTER — Other Ambulatory Visit (HOSPITAL_COMMUNITY): Payer: Self-pay

## 2023-07-06 MED ORDER — OZEMPIC (0.25 OR 0.5 MG/DOSE) 2 MG/3ML ~~LOC~~ SOPN
PEN_INJECTOR | SUBCUTANEOUS | 1 refills | Status: DC
Start: 2023-07-06 — End: 2023-08-27
  Filled 2023-07-06 – 2023-07-11 (×2): qty 3, 28d supply, fill #0
  Filled 2023-08-12: qty 3, 28d supply, fill #1

## 2023-07-07 ENCOUNTER — Other Ambulatory Visit (HOSPITAL_COMMUNITY): Payer: Self-pay

## 2023-07-11 ENCOUNTER — Other Ambulatory Visit (HOSPITAL_COMMUNITY): Payer: Self-pay

## 2023-07-11 ENCOUNTER — Telehealth: Payer: Self-pay

## 2023-07-11 NOTE — Telephone Encounter (Signed)
Initiated Prior authorization [id number] (0.25 or 0.5 MG/DOSE) 2MG /3ML pen-injectors Via: Covermymeds Case/Key:[id number] Status: approved  as of 07/08/23 Reason:Authorization Expiration Date: July 07, 2024. Notified Pt via: Mychart

## 2023-07-12 ENCOUNTER — Other Ambulatory Visit (HOSPITAL_COMMUNITY): Payer: Self-pay

## 2023-07-13 ENCOUNTER — Other Ambulatory Visit (HOSPITAL_COMMUNITY): Payer: Self-pay

## 2023-07-24 ENCOUNTER — Other Ambulatory Visit: Payer: Self-pay | Admitting: Family Medicine

## 2023-07-25 ENCOUNTER — Other Ambulatory Visit: Payer: Self-pay

## 2023-07-26 ENCOUNTER — Other Ambulatory Visit (HOSPITAL_COMMUNITY): Payer: Self-pay

## 2023-07-26 MED ORDER — FINACEA 15 % EX FOAM
1.0000 | Freq: Two times a day (BID) | CUTANEOUS | 0 refills | Status: DC
  Filled 2023-07-26: qty 50, 30d supply, fill #0

## 2023-07-26 MED ORDER — TRAMADOL-ACETAMINOPHEN 37.5-325 MG PO TABS
1.0000 | ORAL_TABLET | Freq: Four times a day (QID) | ORAL | 0 refills | Status: DC | PRN
Start: 2023-07-26 — End: 2023-08-11
  Filled 2023-07-26: qty 30, 8d supply, fill #0

## 2023-07-26 MED ORDER — FREESTYLE LIBRE 3 SENSOR MISC
1.0000 | 2 refills | Status: DC
  Filled 2023-07-26: qty 2, 28d supply, fill #0
  Filled 2023-08-27 – 2023-12-29 (×2): qty 2, 28d supply, fill #1
  Filled 2024-03-20: qty 2, 28d supply, fill #2

## 2023-07-26 MED ORDER — TIZANIDINE HCL 2 MG PO TABS
2.0000 mg | ORAL_TABLET | Freq: Three times a day (TID) | ORAL | 0 refills | Status: DC | PRN
  Filled 2023-07-26: qty 30, 5d supply, fill #0

## 2023-07-29 ENCOUNTER — Encounter (HOSPITAL_BASED_OUTPATIENT_CLINIC_OR_DEPARTMENT_OTHER): Payer: Self-pay | Admitting: Orthopedic Surgery

## 2023-07-29 ENCOUNTER — Encounter (HOSPITAL_BASED_OUTPATIENT_CLINIC_OR_DEPARTMENT_OTHER)
Admission: RE | Admit: 2023-07-29 | Discharge: 2023-07-29 | Disposition: A | Discharge status: Home / Self Care | Payer: Commercial Managed Care - PPO | Source: Ambulatory Visit | Attending: Orthopedic Surgery | Admitting: Orthopedic Surgery

## 2023-07-29 ENCOUNTER — Other Ambulatory Visit: Payer: Self-pay

## 2023-07-29 DIAGNOSIS — Z01812 Encounter for preprocedural laboratory examination: Secondary | ICD-10-CM | POA: Diagnosis not present

## 2023-07-29 LAB — BASIC METABOLIC PANEL
Anion gap: 8 (ref 5–15)
BUN: 10 mg/dL (ref 6–20)
CO2: 28 mmol/L (ref 22–32)
Calcium: 9 mg/dL (ref 8.9–10.3)
Chloride: 99 mmol/L (ref 98–111)
Creatinine, Ser: 0.7 mg/dL (ref 0.44–1.00)
GFR, Estimated: >60 mL/min (ref >60)
Glucose, Bld: 118 mg/dL — ABNORMAL HIGH (ref 70–99)
Potassium: 3.9 mmol/L (ref 3.5–5.1)
Sodium: 135 mmol/L (ref 135–145)

## 2023-07-29 NOTE — Progress Notes (Signed)

## 2023-07-29 NOTE — Progress Notes (Signed)
   07/29/23 0840  PAT Phone Screen  Is the patient taking a GLP-1 receptor agonist? (S)  Yes  Has the patient been informed on holding medication? (S)  Yes (Last dose will be Nov 5)  Do You Have Diabetes? No  Do You Have Hypertension? Yes  Have You Ever Been to the ER for Asthma? No  Have You Taken Oral Steroids in the Past 3 Months? No  Do you Take Phenteramine or any Other Diet Drugs? No  Recent  Lab Work, EKG, CXR? No  Do you have a history of heart problems? No  Any Recent Hospitalizations? No  Height 5\' 1"  (1.549 m)  Weight 63 kg  Pat Appointment Scheduled Yes (BMET and EKG)

## 2023-08-02 ENCOUNTER — Other Ambulatory Visit: Payer: Self-pay | Admitting: Family Medicine

## 2023-08-02 DIAGNOSIS — Z1231 Encounter for screening mammogram for malignant neoplasm of breast: Secondary | ICD-10-CM

## 2023-08-04 ENCOUNTER — Other Ambulatory Visit: Payer: Self-pay

## 2023-08-04 ENCOUNTER — Other Ambulatory Visit (HOSPITAL_BASED_OUTPATIENT_CLINIC_OR_DEPARTMENT_OTHER): Payer: Self-pay

## 2023-08-09 NOTE — Progress Notes (Signed)
Office Visit Note  Patient: Kristine Stevens             Date of Birth: September 14, 1966           MRN: [id number]             PCP: Everrett Coombe, DO Referring: Everrett Coombe, DO Visit Date: 08/23/2023   Subjective:  Follow-up (Patient states she is having more pain in general especially her right hand and both elbows. )   Discussed the use of AI scribe software for clinical note transcription with the patient, who gave verbal consent to proceed.  History of Present Illness   Kristine Stevens is a 57 y.o. female here for follow up with Sjogren's syndrome seropositive RA characterized by intermittent fever and previous CNS inflammation on hydroxychloroquine 300 mg daily.  She presents with increased joint pain in the hands and elbows. They report that the pain has been severe enough to disrupt sleep and is more noticeable during periods of rest. Both elbows are affected, with the right being more painful than the left. The patient describes the elbows as stiff and painful to rest on surfaces. Upon waking, the patient experiences stiffness in both hands, particularly in the right hand, which requires assistance from the left hand to open and close. The stiffness tends to improve as the patient progresses through the day, but returns if the patient remains still for extended periods.   The patient also reports decreased hand strength, making tasks such as opening soda bottles or jars difficult. However, they deny any associated numbness or increased incidence of dropping items. No visible joint swelling has been observed.  The patient is currently on hydroxychloroquine 300mg  and takes gabapentin 600mg  at night, as well as trazodone 25mg  to aid sleep. Gabapentin helps but does not take at night if working the next day due to residual drowsiness. Trazodone helps but has side effects if taking high dose.  The patient also reports a period of approximately one week to Stevens days about a month ago when they  felt like they were in a 'subacute flare', experiencing increased pain, fatigue, and sleep disturbances. They chose to endure this period without seeking medical attention.  The patient is also on Vascepa, a prescription-strength fish oil supplement, and takes a calcium supplement. They have previously used topical diclofenac for Achilles tendonitis and still have some available for use.    Previous HPI 02/08/2023 Kristine Stevens is a 57 y.o. female here for follow-up with Sjogren's syndrome seropositive RA characterized by intermittent fever and previous CNS inflammation on hydroxychloroquine 300 mg daily.  She has been doing worse for the past 3 to 4 weeks particularly with pain in her neck and upper back.  This pain is most bothersome at nighttime causing difficulty sleeping and some sleep fragmentation.  She was prescribed tizanidine and Ultracet for this but with only a limited response to the treatment.  In addition to the gabapentin 600 mg at night which she has been on previously.  Did not recall any specific injury or activity change to account for increased symptoms.  Does not have widespread increase of symptoms and no recurrence of fever or neurologic symptoms.  She does feel her fatigue has been worse but not sure if this is related to the sleep disruption.   Labs reviewed 11/09/22 Kristine Stevens is a 57 y.o. female here for follow up for Sjogren syndrome and seropositive RA with intermittent fever and previous CNS inflammation after  starting hydroxychloroquine 200 mg daily.  She had another flareup between Christmas and New Year's pretty debilitating spent the majority of 4 days in bed.  She only had low fevers during this time but also severe fatigue.  She is having fairly diffuse pain at nighttime disrupting her sleep more than half of the nights.  Gabapentin and low-dose tramadol have been helpful for sleeping through the night.  May be see some benefit with muscle relaxer medication but  avoid this due to getting a drowsiness or hangover effect into the following day.  Not seeing any obvious peripheral joint swelling.  So far she is not sure whether hydroxychloroquine is making much difference although the febrile episode was much less severe compared to the that landed her in the hospital last year.   08/25/22 Kristine Stevens is a 57 y.o. female here for follow up for sjogren syndrome and suspected seropositive RA. Lab evaluation at initial visit showing increased CCP Ab titer but normal inflammatory markers. She continues to experience bilateral hand stiffness and swelling most severe in the morning. Also having pain at the left great toe and schedule to see orthopedic surgery clinic for this. She still feels fatigued similar as before. She had recent ophthalmology follow up and left eye amniotic membrane procedure for dry eye.   Kristine Stevens is a 57 y.o. female here for evaluation of fevers and pain associated with positive CCP Abs and history of sjogren syndrome. She was recently hospitalized with SIRS and suspected infection but no organism identified also concerned for underlying autoimmune process. She completed a course of doxycycline for possible tickborne illness. CMV testing was positive for IgG and IgM. Ferritin low at 11 with findings consistent for IDA.    Symptoms started with severe low back pain then progressed to whole spine and headache and fevers. After one week hospitalization symptoms improved and has returned mostly to baseline. Still has pain in the low back more frequently than before. Notices left hip pain pretty regularly and popping or cracking sensation in her thoracic spine. Stiffness and pain commonly in both feet this lasts for about 1 hour also gets elbows and hands for 30-60 minutes. She takes ibuprofen rarely due to previous bariatric surgery. Takes gabapentin 600 mg at night when she is not working which helps partially.   She has a history of gastric  sleeve surgery and has some chronic diarrhea but this was more related to sphincter of Oddi dysfunction before this too. She had 3 left knee surgeries she had meniscal injury there as a teenager. She has a family history of RA in her mother who had deforming disease involving both hands.   Labs reviewed 03/2022 ANA neg RF neg CCP 81 Ferritin 11 Lyme neg Erlichiosis IgM equivocal CMV IgG pos IgM pos   Activities of Daily Living:  Patient reports morning stiffness for 1 hour.   Patient Reports nocturnal pain.  Difficulty dressing/grooming: Denies Difficulty climbing stairs: Denies Difficulty getting out of chair: Denies Difficulty using hands for taps, buttons, cutlery, and/or writing: Reports   Review of Systems  Constitutional:  Positive for fatigue.  HENT:  Positive for mouth sores and mouth dryness.   Eyes:  Positive for dryness.  Respiratory:  Negative for shortness of breath.   Cardiovascular:  Negative for chest pain and palpitations.  Gastrointestinal:  Negative for blood in stool, constipation and diarrhea.  Endocrine: Negative for increased urination.  Genitourinary:  Negative for involuntary urination.  Musculoskeletal:  Positive for  joint pain, joint pain, joint swelling, morning stiffness and muscle tenderness. Negative for gait problem, myalgias, muscle weakness and myalgias.  Skin:  Negative for color change, rash, hair loss and sensitivity to sunlight.  Allergic/Immunologic: Negative for susceptible to infections.  Neurological:  Negative for dizziness and headaches.  Hematological:  Negative for swollen glands.  Psychiatric/Behavioral:  Positive for sleep disturbance. Negative for depressed mood. The patient is not nervous/anxious.     PMFS History:  Patient Active Problem List   Diagnosis Date Noted   Upper back pain 02/08/2023   At increased risk for cardiovascular disease 12/21/2022   Seropositive rheumatoid arthritis (HCC) 07/22/2022   Joint pain  06/02/2022   FUO (fever of unknown origin) 04/19/2022   Sjogren's syndrome (HCC) 04/03/2022   Rosacea 02/04/2022   Encounter for counseling 02/04/2022   Essential hypertension 09/27/2021   Insomnia secondary to depression with anxiety 09/27/2021   Tick bite 01/25/2021   Statin intolerance 04/12/2019   Sicca syndrome (HCC) 02/05/2019   Elevated transaminase measurement 11/02/2017   History of high cholesterol 05/27/2016   History of gestational diabetes 05/27/2016   History of acute pancreatitis 05/27/2016   Prediabetes 04/17/2015   Status post laparoscopic sleeve gastrectomy June 2016 03/25/2015   Hyperlipemia 01/02/2015   Dysfunction of sphincter of Oddi 08/16/2013    Past Medical History:  Diagnosis Date   GERD (gastroesophageal reflux disease)    Heartburn    Hyperlipidemia    Hypertension    IBS (irritable bowel syndrome)    PONV (postoperative nausea and vomiting)    severe   Pre-diabetes    Prediabetes    RA (rheumatoid arthritis) (HCC)    Rosacea    Sjogren's syndrome (HCC)    Sphincter of Oddi dysfunction     Family History  Problem Relation Age of Onset   Hypertension Mother    Hyperlipidemia Mother    Fibromyalgia Mother    Parkinson's disease Mother    Rheum arthritis Mother    Hyperlipidemia Father    Hypertension Father    Parkinson's disease Brother    Mental retardation Brother    Hyperlipidemia Brother    Breast cancer Maternal Grandmother 78   Healthy Daughter    Breast cancer Cousin 45   Colon cancer Neg Hx    Esophageal cancer Neg Hx    Rectal cancer Neg Hx    Stomach cancer Neg Hx    Past Surgical History:  Procedure Laterality Date   ABDOMINOPLASTY  05/25/2012   ARTHRODESIS METATARSALPHALANGEAL JOINT (MTPJ) Left 08/11/2023   Procedure: LEFT HALLUX ARTHRODESIS METATARSALPHALANGEAL JOINT (MTPJ);  Surgeon: Toni Arthurs, MD;  Location: [facility];  Service: Orthopedics;  Laterality: Left;   BUNIONECTOMY Left 08/11/2023    Procedure: Andree Moro;  Surgeon: Toni Arthurs, MD;  Location: [facility];  Service: Orthopedics;  Laterality: Left;   CESAREAN SECTION  1991, 1998   x2   CHOLECYSTECTOMY  1995   IRRIGATION AND DEBRIDEMENT ABSCESS Right 05/22/2013   Procedure: MINOR INCISION AND DRAINAGE OF ABSCESS;  Surgeon: Nicki Reaper, MD;  Location:  SURGERY CENTER;  Service: Orthopedics;  Laterality: Right;   KNEE ARTHROSCOPY Left    x3   LAPAROSCOPIC GASTRIC SLEEVE RESECTION N/A 03/25/2015   Procedure: LAPAROSCOPIC GASTRIC SLEEVE RESECTION UPPER ENDOSCOPY;  Surgeon: Luretha Murphy, MD;  Location: [facility];  Service: General;  Laterality: N/A;   VAGINAL BIRTH AFTER CESAREAN SECTION  1993, 2001   x2   Social History   Social History  Narrative   Not on file   Immunization History  Administered Date(s) Administered   Influenza,inj,Quad PF,6+ Mos 05/27/2016, 06/27/2017, 06/04/2020, 06/02/2022   Influenza-Unspecified 07/02/2013, 07/26/2014, 06/28/2019   Janssen (J&J) SARS-COV-2 Vaccination 01/02/2020   Tdap 03/21/2017   Zoster Recombinant(Shingrix) 06/04/2020, 09/16/2020     Objective: Vital Signs: BP 123/84 (BP Location: Left Arm, Patient Position: Sitting, Cuff Size: Normal)   Pulse 79   Resp 14   Ht 5\' 1"  (1.549 m)   Wt 145 lb (65.8 kg)   BMI 27.40 kg/m    Physical Exam Eyes:     Conjunctiva/sclera: Conjunctivae normal.  Cardiovascular:     Rate and Rhythm: Normal rate and regular rhythm.  Pulmonary:     Effort: Pulmonary effort is normal.     Breath sounds: Normal breath sounds.  Musculoskeletal:     Right lower leg: No edema.     Left lower leg: No edema.  Lymphadenopathy:     Cervical: No cervical adenopathy.  Skin:    General: Skin is warm and dry.     Findings: No rash.  Neurological:     Mental Status: She is alert.  Psychiatric:        Mood and Affect: Mood normal.      Musculoskeletal Exam:  Shoulders full ROM no tenderness or swelling Right elbow  tenderness with movement, to pressure on medial and lateral epicondyle, left lateral epicondyle tenderness, no palpable effusion Wrists full ROM no tenderness or swelling Right 2nd MCP tenderness to pressure, full finger ROM throughout Knees full ROM no tenderness or swelling Ankles full ROM no tenderness or swelling Right 1st MTP tenderness to pressure on dorsal side, pain with dorsiflexion  No MCP joint effusion or hyperemia present on limited MSKUS exam  Investigation: No additional findings.  Imaging: MM 3D SCREENING MAMMOGRAM BILATERAL BREAST  Result Date: 08/12/2023 CLINICAL DATA:  Screening. EXAM: DIGITAL SCREENING BILATERAL MAMMOGRAM WITH TOMOSYNTHESIS AND CAD TECHNIQUE: Bilateral screening digital craniocaudal and mediolateral oblique mammograms were obtained. Bilateral screening digital breast tomosynthesis was performed. The images were evaluated with computer-aided detection. COMPARISON:  Previous exam(s). ACR Breast Density Category b: There are scattered areas of fibroglandular density. FINDINGS: There are no findings suspicious for malignancy. IMPRESSION: No mammographic evidence of malignancy. A result letter of this screening mammogram will be mailed directly to the patient. RECOMMENDATION: Screening mammogram in one year. (Code:SM-B-01Y) BI-RADS CATEGORY  1: Negative. Electronically Signed   By: Baird Lyons M.D.   On: 08/12/2023 14:33   DG MINI C-ARM IMAGE ONLY  Result Date: 08/11/2023 There is no interpretation for this exam.  This order is for images obtained during a surgical procedure.  Please See "Surgeries" Tab for more information regarding the procedure.    Recent Labs: Lab Results  Component Value Date   WBC 5.8 02/08/2023   HGB 12.0 02/08/2023   PLT 330 02/08/2023   NA 135 07/29/2023   K 3.9 07/29/2023   CL 99 07/29/2023   CO2 28 07/29/2023   GLUCOSE 118 (H) 07/29/2023   BUN 10 07/29/2023   CREATININE 0.70 07/29/2023   BILITOT 0.5 04/15/2022   ALKPHOS  74 09/30/2021   AST 18 04/15/2022   ALT 37 (H) 04/15/2022   PROT 7.0 04/15/2022   ALBUMIN 4.6 09/30/2021   CALCIUM 9.0 07/29/2023   GFRAA 111 03/25/2020    Speciality Comments: No specialty comments available.  Procedures:  No procedures performed Allergies: Patient has no known allergies.   Assessment / Plan:  Visit Diagnoses: Seropositive rheumatoid arthritis (HCC) - Plan: Sedimentation rate, C-reactive protein, Cyclic citrul peptide antibody, IgG Joint pain in multiple areas Increased pain and stiffness, particularly in the morning and at rest. No visible joint swelling. Decreased hand strength. Pain in the feet, particularly the right foot. Recent history of great toe surgery due to arthritis. Not sure if this is really RA related based on the exam, rechecking serum inflammatory markers. If elevated would be indication for more aggressive DMARD. -Continue Hydroxychloroquine 300mg  daily. -Discussed use of topical diclofenac because she does not take oral NSAIDs due to gastric surgery -Discussed use of osteoarthritis supplements (collagen peptides, turmeric, omega-3).  She is already on high-dose omega-3 supplement. -Check blood for inflammatory markers and CCP antibodies. -Consider X-rays if symptoms persist or worsen. -Consider Methotrexate if evidence of increased inflammation or worsening symptoms.   High risk medication use - hydroxychloroquine 300 mg daily. Needs a PLQ eye exam. - Plan: CBC with Differential/Platelet  Checking CBC for medication monitoring on continued long-term use of hydroxychloroquine.  Previous metabolic panel was normal. -Will need to get updated ophthalmology eye exam.  Sleep Disturbance Difficulty falling asleep and staying asleep due to joint pain.  Discussed insomnia and sleep fragmentation can contribute to worsening of pain especially myofascial pain or tendinopathy.   Orders: Orders Placed This Encounter  Procedures   Sedimentation rate    C-reactive protein   CBC with Differential/Platelet   Cyclic citrul peptide antibody, IgG   No orders of the defined types were placed in this encounter.    Follow-Up Instructions: No follow-ups on file.   Fuller Plan, MD  Note - This record has been created using [employer].  Chart creation errors have been sought, but may not always  have been located. Such creation errors do not reflect on  the standard of medical care.

## 2023-08-10 ENCOUNTER — Ambulatory Visit: Payer: Commercial Managed Care - PPO

## 2023-08-10 DIAGNOSIS — Z1231 Encounter for screening mammogram for malignant neoplasm of breast: Secondary | ICD-10-CM | POA: Diagnosis not present

## 2023-08-11 ENCOUNTER — Other Ambulatory Visit (HOSPITAL_COMMUNITY): Payer: Self-pay

## 2023-08-11 ENCOUNTER — Ambulatory Visit: Payer: Commercial Managed Care - PPO | Admitting: Internal Medicine

## 2023-08-11 ENCOUNTER — Encounter (HOSPITAL_BASED_OUTPATIENT_CLINIC_OR_DEPARTMENT_OTHER): Payer: Self-pay | Admitting: Orthopedic Surgery

## 2023-08-11 ENCOUNTER — Other Ambulatory Visit: Payer: Self-pay

## 2023-08-11 ENCOUNTER — Ambulatory Visit (HOSPITAL_BASED_OUTPATIENT_CLINIC_OR_DEPARTMENT_OTHER)
Admission: RE | Admit: 2023-08-11 | Discharge: 2023-08-11 | Disposition: A | Discharge status: Home / Self Care | Payer: Commercial Managed Care - PPO | Attending: Orthopedic Surgery | Admitting: Orthopedic Surgery

## 2023-08-11 ENCOUNTER — Ambulatory Visit (HOSPITAL_BASED_OUTPATIENT_CLINIC_OR_DEPARTMENT_OTHER): Payer: Commercial Managed Care - PPO

## 2023-08-11 ENCOUNTER — Encounter (HOSPITAL_BASED_OUTPATIENT_CLINIC_OR_DEPARTMENT_OTHER)
Admission: RE | Disposition: A | Discharge status: Home / Self Care | Payer: Self-pay | Source: Home / Self Care | Attending: Orthopedic Surgery

## 2023-08-11 ENCOUNTER — Ambulatory Visit (HOSPITAL_BASED_OUTPATIENT_CLINIC_OR_DEPARTMENT_OTHER): Payer: Commercial Managed Care - PPO | Admitting: Anesthesiology

## 2023-08-11 DIAGNOSIS — M21612 Bunion of left foot: Secondary | ICD-10-CM | POA: Insufficient documentation

## 2023-08-11 DIAGNOSIS — F32A Depression, unspecified: Secondary | ICD-10-CM | POA: Insufficient documentation

## 2023-08-11 DIAGNOSIS — I10 Essential (primary) hypertension: Secondary | ICD-10-CM | POA: Insufficient documentation

## 2023-08-11 DIAGNOSIS — M069 Rheumatoid arthritis, unspecified: Secondary | ICD-10-CM | POA: Insufficient documentation

## 2023-08-11 DIAGNOSIS — F419 Anxiety disorder, unspecified: Secondary | ICD-10-CM | POA: Insufficient documentation

## 2023-08-11 DIAGNOSIS — Z79899 Other long term (current) drug therapy: Secondary | ICD-10-CM | POA: Insufficient documentation

## 2023-08-11 DIAGNOSIS — Z7982 Long term (current) use of aspirin: Secondary | ICD-10-CM | POA: Diagnosis not present

## 2023-08-11 DIAGNOSIS — G8918 Other acute postprocedural pain: Secondary | ICD-10-CM | POA: Diagnosis not present

## 2023-08-11 DIAGNOSIS — M3500 Sicca syndrome, unspecified: Secondary | ICD-10-CM | POA: Diagnosis not present

## 2023-08-11 DIAGNOSIS — R7303 Prediabetes: Secondary | ICD-10-CM | POA: Diagnosis not present

## 2023-08-11 DIAGNOSIS — Z7984 Long term (current) use of oral hypoglycemic drugs: Secondary | ICD-10-CM | POA: Diagnosis not present

## 2023-08-11 DIAGNOSIS — M1990 Unspecified osteoarthritis, unspecified site: Secondary | ICD-10-CM | POA: Insufficient documentation

## 2023-08-11 DIAGNOSIS — M2012 Hallux valgus (acquired), left foot: Secondary | ICD-10-CM

## 2023-08-11 DIAGNOSIS — M2022 Hallux rigidus, left foot: Secondary | ICD-10-CM | POA: Insufficient documentation

## 2023-08-11 DIAGNOSIS — Z01818 Encounter for other preprocedural examination: Secondary | ICD-10-CM

## 2023-08-11 HISTORY — DX: Rheumatoid arthritis, unspecified: M06.9

## 2023-08-11 HISTORY — DX: Prediabetes: R73.03

## 2023-08-11 HISTORY — PX: ARTHRODESIS METATARSALPHALANGEAL JOINT (MTPJ): SHX6566

## 2023-08-11 HISTORY — PX: BUNIONECTOMY: SHX129

## 2023-08-11 SURGERY — FUSION, JOINT, GREAT TOE
Anesthesia: General | Site: Foot | Laterality: Left

## 2023-08-11 MED ORDER — SENNA 8.6 MG PO TABS
2.0000 | ORAL_TABLET | Freq: Two times a day (BID) | ORAL | 0 refills | Status: DC
  Filled 2023-08-11: qty 30, 8d supply, fill #0

## 2023-08-11 MED ORDER — MIDAZOLAM HCL 2 MG/2ML IJ SOLN
2.0000 mg | Freq: Once | INTRAMUSCULAR | Status: AC
  Administered 2023-08-11: 2 mg via INTRAVENOUS

## 2023-08-11 MED ORDER — VANCOMYCIN HCL 500 MG IV SOLR
INTRAVENOUS | Status: AC
  Filled 2023-08-11: qty 10

## 2023-08-11 MED ORDER — CEFAZOLIN SODIUM-DEXTROSE 2-4 GM/100ML-% IV SOLN
INTRAVENOUS | Status: AC
  Filled 2023-08-11: qty 100

## 2023-08-11 MED ORDER — VANCOMYCIN HCL 500 MG IV SOLR
INTRAVENOUS | Status: DC | PRN
  Administered 2023-08-11: 500 mg via TOPICAL

## 2023-08-11 MED ORDER — OXYCODONE HCL 5 MG PO TABS
5.0000 mg | ORAL_TABLET | Freq: Four times a day (QID) | ORAL | 0 refills | Status: AC | PRN
  Filled 2023-08-11: qty 12, 3d supply, fill #0

## 2023-08-11 MED ORDER — PROPOFOL 500 MG/50ML IV EMUL
INTRAVENOUS | Status: DC | PRN
  Administered 2023-08-11: 125 ug/kg/min via INTRAVENOUS

## 2023-08-11 MED ORDER — DOCUSATE SODIUM 100 MG PO CAPS
100.0000 mg | ORAL_CAPSULE | Freq: Two times a day (BID) | ORAL | 0 refills | Status: DC
  Filled 2023-08-11: qty 30, 15d supply, fill #0

## 2023-08-11 MED ORDER — LACTATED RINGERS IV SOLN: INTRAVENOUS | Status: DC

## 2023-08-11 MED ORDER — MIDAZOLAM HCL 2 MG/2ML IJ SOLN
INTRAMUSCULAR | Status: AC
  Filled 2023-08-11: qty 2

## 2023-08-11 MED ORDER — 0.9 % SODIUM CHLORIDE (POUR BTL) OPTIME
TOPICAL | Status: DC | PRN
  Administered 2023-08-11: 200 mL

## 2023-08-11 MED ORDER — SCOPOLAMINE 1 MG/3DAYS TD PT72
MEDICATED_PATCH | TRANSDERMAL | Status: AC
  Filled 2023-08-11: qty 1

## 2023-08-11 MED ORDER — CEFAZOLIN SODIUM-DEXTROSE 2-4 GM/100ML-% IV SOLN
2.0000 g | INTRAVENOUS | Status: AC
  Administered 2023-08-11: 2 g via INTRAVENOUS

## 2023-08-11 MED ORDER — SODIUM CHLORIDE 0.9 % IV SOLN: INTRAVENOUS | Status: DC

## 2023-08-11 MED ORDER — ACETAMINOPHEN 500 MG PO TABS
ORAL_TABLET | ORAL | Status: AC
  Filled 2023-08-11: qty 2

## 2023-08-11 MED ORDER — DEXAMETHASONE SODIUM PHOSPHATE 4 MG/ML IJ SOLN
INTRAMUSCULAR | Status: DC | PRN
  Administered 2023-08-11: 5 mg via PERINEURAL

## 2023-08-11 MED ORDER — FENTANYL CITRATE (PF) 100 MCG/2ML IJ SOLN: 25.0000 ug | INTRAMUSCULAR | Status: DC | PRN

## 2023-08-11 MED ORDER — PROPOFOL 10 MG/ML IV BOLUS
INTRAVENOUS | Status: DC | PRN
  Administered 2023-08-11: 200 mg via INTRAVENOUS

## 2023-08-11 MED ORDER — ACETAMINOPHEN 500 MG PO TABS
1000.0000 mg | ORAL_TABLET | Freq: Once | ORAL | Status: AC
  Administered 2023-08-11: 1000 mg via ORAL

## 2023-08-11 MED ORDER — SCOPOLAMINE 1 MG/3DAYS TD PT72
1.0000 | MEDICATED_PATCH | Freq: Once | TRANSDERMAL | Status: DC
  Administered 2023-08-11: 1.5 mg via TRANSDERMAL

## 2023-08-11 MED ORDER — AMISULPRIDE (ANTIEMETIC) 5 MG/2ML IV SOLN: 10.0000 mg | Freq: Once | INTRAVENOUS | Status: DC | PRN

## 2023-08-11 MED ORDER — BUPIVACAINE-EPINEPHRINE (PF) 0.5% -1:200000 IJ SOLN
INTRAMUSCULAR | Status: AC
  Filled 2023-08-11: qty 30

## 2023-08-11 MED ORDER — ROPIVACAINE HCL 5 MG/ML IJ SOLN
INTRAMUSCULAR | Status: DC | PRN
  Administered 2023-08-11: 30 mL via PERINEURAL

## 2023-08-11 MED ORDER — LIDOCAINE 2% (20 MG/ML) 5 ML SYRINGE
INTRAMUSCULAR | Status: DC | PRN
  Administered 2023-08-11: 60 mg via INTRAVENOUS

## 2023-08-11 MED ORDER — ONDANSETRON HCL 4 MG/2ML IJ SOLN
INTRAMUSCULAR | Status: DC | PRN
  Administered 2023-08-11: 4 mg via INTRAVENOUS

## 2023-08-11 MED ORDER — DEXMEDETOMIDINE HCL IN NACL 80 MCG/20ML IV SOLN
INTRAVENOUS | Status: DC | PRN
Start: 2023-08-11 — End: 2023-08-11
  Administered 2023-08-11: 12 ug via INTRAVENOUS

## 2023-08-11 MED ORDER — CLONIDINE HCL (ANALGESIA) 100 MCG/ML EP SOLN
EPIDURAL | Status: DC | PRN
  Administered 2023-08-11: 80 ug

## 2023-08-11 SURGICAL SUPPLY — 93 items
BANDAGE ESMARK 6X9 LF (GAUZE/BANDAGES/DRESSINGS) IMPLANT
BIT DRILL 2.7XCANN QCK CNCT (BIT) IMPLANT
BIT DRILL CANN 2.7 (BIT) ×1
BIT DRILL Q-C 2.0 DIA 100 (BIT) IMPLANT
BIT DRL 2.7XCANN QCK CNCT (BIT) ×1
BLADE AVERAGE 25X9 (BLADE) IMPLANT
BLADE LONG MED 25X9 (BLADE) IMPLANT
BLADE MICRO SAGITTAL (BLADE) IMPLANT
BLADE MINI RND TIP GREEN BEAV (BLADE) ×2 IMPLANT
BLADE OSC/SAG .038X5.5 CUT EDG (BLADE) IMPLANT
BLADE SURG 15 STRL LF DISP TIS (BLADE) ×4 IMPLANT
BLADE SURG 15 STRL SS (BLADE) ×2
BNDG COHESIVE 4X5 WHT NS (GAUZE/BANDAGES/DRESSINGS) IMPLANT
BNDG ELASTIC 4INX 5YD STR LF (GAUZE/BANDAGES/DRESSINGS) ×2 IMPLANT
BNDG ELASTIC 6INX 5YD STR LF (GAUZE/BANDAGES/DRESSINGS) IMPLANT
BNDG ESMARK 6X9 LF (GAUZE/BANDAGES/DRESSINGS)
BNDG STRETCH GAUZE 3IN X12FT (GAUZE/BANDAGES/DRESSINGS) ×2 IMPLANT
BOOT STEPPER DURA LG (SOFTGOODS) IMPLANT
BOOT STEPPER DURA MED (SOFTGOODS) IMPLANT
BOOT STEPPER DURA SM (SOFTGOODS) IMPLANT
BOOT STEPPER DURA XLG (SOFTGOODS) IMPLANT
BUR MIS CONICAL WEDGE 4.3X13 (BURR) IMPLANT
BURR MIS CONICAL WEDGE 4.3X13 (BURR)
CHLORAPREP W/TINT 26 (MISCELLANEOUS) ×2 IMPLANT
COVER BACK TABLE 60X90IN (DRAPES) ×2 IMPLANT
CUFF TOURN SGL QUICK 24 (TOURNIQUET CUFF)
CUFF TOURN SGL QUICK 34 (TOURNIQUET CUFF) ×1
CUFF TRNQT CYL 24X4X16.5-23 (TOURNIQUET CUFF) IMPLANT
CUFF TRNQT CYL 34X4.125X (TOURNIQUET CUFF) IMPLANT
DRAPE EXTREMITY T 121X128X90 (DISPOSABLE) ×2 IMPLANT
DRAPE OEC MINIVIEW 54X84 (DRAPES) ×2 IMPLANT
DRAPE U-SHAPE 47X51 STRL (DRAPES) ×2 IMPLANT
DRESSING MEPILEX FLEX 4X4 (GAUZE/BANDAGES/DRESSINGS) IMPLANT
DRSG MEPILEX FLEX 4X4 (GAUZE/BANDAGES/DRESSINGS)
DRSG MEPITEL 4X7.2 (GAUZE/BANDAGES/DRESSINGS) ×2 IMPLANT
ELECT REM PT RETURN 9FT ADLT (ELECTROSURGICAL) ×1
ELECTRODE REM PT RTRN 9FT ADLT (ELECTROSURGICAL) ×2 IMPLANT
GAUZE PAD ABD 8X10 STRL (GAUZE/BANDAGES/DRESSINGS) ×2 IMPLANT
GAUZE SPONGE 4X4 12PLY STRL (GAUZE/BANDAGES/DRESSINGS) ×2 IMPLANT
GAUZE STRETCH 2X75IN STRL (MISCELLANEOUS) IMPLANT
GLOVE BIO SURGEON STRL SZ8 (GLOVE) ×2 IMPLANT
GLOVE BIOGEL PI IND STRL 7.0 (GLOVE) IMPLANT
GLOVE BIOGEL PI IND STRL 8 (GLOVE) ×4 IMPLANT
GLOVE ECLIPSE 8.0 STRL XLNG CF (GLOVE) ×2 IMPLANT
GLOVE SURG SS PI 6.5 STRL IVOR (GLOVE) IMPLANT
GLOVE SURG SYN 7.5 E (GLOVE) ×1
GLOVE SURG SYN 7.5 PF PI (GLOVE) IMPLANT
GOWN STRL REUS W/ TWL LRG LVL3 (GOWN DISPOSABLE) ×2 IMPLANT
GOWN STRL REUS W/ TWL XL LVL3 (GOWN DISPOSABLE) ×4 IMPLANT
GOWN STRL REUS W/TWL LRG LVL3 (GOWN DISPOSABLE) ×2
GOWN STRL REUS W/TWL XL LVL3 (GOWN DISPOSABLE) ×2
GUIDEWIRE PIN ORTH 6X1.6XSMTH (WIRE) IMPLANT
K-WIRE 1.6 (WIRE) ×1
K-WIRE DBL .054X9 NSTRL (WIRE) ×1
KWIRE DBL .054X9 NSTRL (WIRE) ×2 IMPLANT
NDL HYPO 22X1.5 SAFETY MO (MISCELLANEOUS) IMPLANT
NDL HYPO 25X1 1.5 SAFETY (NEEDLE) IMPLANT
NEEDLE HYPO 22X1.5 SAFETY MO (MISCELLANEOUS)
NEEDLE HYPO 25X1 1.5 SAFETY (NEEDLE)
NS IRRIG 1000ML POUR BTL (IV SOLUTION) ×2 IMPLANT
PACK BASIN DAY SURGERY FS (CUSTOM PROCEDURE TRAY) ×2 IMPLANT
PAD CAST 4YDX4 CTTN HI CHSV (CAST SUPPLIES) ×2 IMPLANT
PADDING CAST ABS COTTON 4X4 ST (CAST SUPPLIES) IMPLANT
PADDING CAST COTTON 4X4 STRL (CAST SUPPLIES) ×1
PADDING CAST COTTON 6X4 STRL (CAST SUPPLIES) IMPLANT
PENCIL SMOKE EVACUATOR (MISCELLANEOUS) ×2 IMPLANT
PLATE TUBULAR 31 4H (Plate) IMPLANT
SANITIZER HAND PURELL FF 515ML (MISCELLANEOUS) ×2 IMPLANT
SCREW CANN 4X30 (Screw) IMPLANT
SCREW CORT 2.5X20X2.7XST SM (Screw) IMPLANT
SCREW CORTICAL 2.7X14MM (Screw) IMPLANT
SCREW CORTICAL 2.7X18MM (Screw) IMPLANT
SCREW CORTICAL 2.7X20MM (Screw) ×1 IMPLANT
SCREW NLOCK CORT 2.7X16 NS (Screw) IMPLANT
SHEET MEDIUM DRAPE 40X70 STRL (DRAPES) ×2 IMPLANT
SLEEVE SCD COMPRESS KNEE MED (STOCKING) ×2 IMPLANT
SPLINT PLASTER CAST FAST 5X30 (CAST SUPPLIES) IMPLANT
SPONGE SURGIFOAM ABS GEL 12-7 (HEMOSTASIS) IMPLANT
SPONGE T-LAP 18X18 ~~LOC~~+RFID (SPONGE) ×2 IMPLANT
STOCKINETTE 6 STRL (DRAPES) ×2 IMPLANT
SUCTION TUBE FRAZIER 10FR DISP (SUCTIONS) ×2 IMPLANT
SUT ETHILON 3 0 PS 1 (SUTURE) ×2 IMPLANT
SUT MNCRL AB 3-0 PS2 18 (SUTURE) ×2 IMPLANT
SUT VIC AB 2-0 SH 27 (SUTURE) ×1
SUT VIC AB 2-0 SH 27XBRD (SUTURE) ×2 IMPLANT
SUT VICRYL 0 SH 27 (SUTURE) IMPLANT
SUT VICRYL 0 UR6 27IN ABS (SUTURE) IMPLANT
SYR BULB EAR ULCER 3OZ GRN STR (SYRINGE) ×2 IMPLANT
SYR CONTROL 10ML LL (SYRINGE) IMPLANT
TOWEL GREEN STERILE FF (TOWEL DISPOSABLE) ×4 IMPLANT
TUBE CONNECTING 20X1/4 (TUBING) ×2 IMPLANT
UNDERPAD 30X36 HEAVY ABSORB (UNDERPADS AND DIAPERS) ×2 IMPLANT
YANKAUER SUCT BULB TIP NO VENT (SUCTIONS) IMPLANT

## 2023-08-11 NOTE — H&P (Addendum)
Kristine Stevens is an 57 y.o. female.   Chief Complaint: Left forefoot pain HPI: Patient is 56 yo female that presents to the OR today for elective treatment of her painful bunion deformity and great toe arthritis.  She has failed conservative treatment consisting of activity modification, oral anti-inflammatories and shoewear modification and wishes to proceed with definitive treatment.  Allergies: No Active Allergies  Past Medical History:  Diagnosis Date   GERD (gastroesophageal reflux disease)    Heartburn    Hyperlipidemia    Hypertension    IBS (irritable bowel syndrome)    PONV (postoperative nausea and vomiting)    severe   Pre-diabetes    Prediabetes    RA (rheumatoid arthritis) (HCC)    Rosacea    Sjogren's syndrome (HCC)    Sphincter of Oddi dysfunction     Past Surgical History:  Procedure Laterality Date   ABDOMINOPLASTY  05/25/2012   CESAREAN SECTION  1991, 1998   x2   CHOLECYSTECTOMY  1995   IRRIGATION AND DEBRIDEMENT ABSCESS Right 05/22/2013   Procedure: MINOR INCISION AND DRAINAGE OF ABSCESS;  Surgeon: Nicki Reaper, MD;  Location: [facility];  Service: Orthopedics;  Laterality: Right;   KNEE ARTHROSCOPY Left    x3   LAPAROSCOPIC GASTRIC SLEEVE RESECTION N/A 03/25/2015   Procedure: LAPAROSCOPIC GASTRIC SLEEVE RESECTION UPPER ENDOSCOPY;  Surgeon: Luretha Murphy, MD;  Location: [facility];  Service: General;  Laterality: N/A;   VAGINAL BIRTH AFTER CESAREAN SECTION  1993, 2001   x2    Family History: Family History  Problem Relation Age of Onset   Hypertension Mother    Hyperlipidemia Mother    Fibromyalgia Mother    Parkinson's disease Mother    Rheum arthritis Mother    Hyperlipidemia Father    Hypertension Father    Parkinson's disease Brother    Mental retardation Brother    Hyperlipidemia Brother    Breast cancer Maternal Grandmother 17   Healthy Daughter    Breast cancer Cousin 45   Colon cancer Neg Hx    Esophageal cancer Neg Hx     Rectal cancer Neg Hx    Stomach cancer Neg Hx     Social History:   reports that she has never smoked. She has never been exposed to tobacco smoke. She has never used smokeless tobacco. She reports current alcohol use. She reports that she does not use drugs.  Medications: Medications Prior to Admission  Medication Sig Dispense Refill   aspirin 81 MG tablet Take 81 mg by mouth daily.     Azelaic Acid (FINACEA) 15 % FOAM Apply a thin layer to the face twice a day 50 g 0   citalopram (CELEXA) 20 MG tablet Take 1.5 tablets (30 mg total) by mouth daily. 135 tablet 1   Continuous Glucose Sensor (FREESTYLE LIBRE 3 SENSOR) MISC Place 1 sensor on the skin every 14 days. Use to check glucose continuously 2 each 2   gabapentin (NEURONTIN) 300 MG capsule Take 2 capsules (600 mg total) by mouth at bedtime as needed. 180 capsule 3   hydroxychloroquine (PLAQUENIL) 200 MG tablet Take 1&1/2 tablets (300 mg total) by mouth daily. 135 tablet 0   icosapent Ethyl (VASCEPA) 1 g capsule Take 2 capsules (2 g total) by mouth 2 (two) times daily. 360 capsule 1   losartan-hydrochlorothiazide (HYZAAR) 100-12.5 MG tablet Take 1 tablet by mouth daily. 90 tablet 3   metFORMIN (GLUCOPHAGE-XR) 500 MG 24 hr tablet Take 2 tablets (1,000  mg total) by mouth daily with breakfast. 180 tablet 1   Multiple Vitamins-Minerals (MULTIVITAMIN ADULT PO) Take by mouth daily.     Semaglutide,0.25 or 0.5MG /DOS, (OZEMPIC, 0.25 OR 0.5 MG/DOSE,) 2 MG/3ML SOPN Inject 0.25mg  weekly x4 weeks then increase to 0.5mg  weekly 3 mL 1   tiZANidine (ZANAFLEX) 2 MG tablet Take 1-2 tablets (2-4 mg total) by mouth every 8 (eight) hours as needed. 30 tablet 0   traMADol-acetaminophen (ULTRACET) 37.5-325 MG tablet Take 1 tablet by mouth every 6 (six) hours as needed. 30 tablet 0   traZODone (DESYREL) 50 MG tablet Take 1-2 tablets (50-100 mg total) by mouth at bedtime as needed for sleep. 90 tablet 3   ondansetron (ZOFRAN-ODT) 4 MG disintegrating tablet Take 1  tablet (4 mg total) by mouth every 8 (eight) hours as needed for nausea 20 tablet 0    No results found for this or any previous visit (from the past 48 hour(s)).  No results found.    Blood pressure 124/84, pulse 76, temperature 98.3 F (36.8 C), temperature source Temporal, resp. rate 18, height 5\' 1"  (1.549 m), weight 63 kg, SpO2 100%.  PE:  well nourished and well developed.  NAD.  EOMI.  Resp unlabored.  Left bunion.  Tender to palpation at L hallux MPJ dorsally.  Assessment/Plan Left hallux rigidus and bunion  The patient presents for definitive treatment of her L bunion deformity and arthritis.  She will require left hallux MPJ arthrodesis and silver bunionectomy.  After reviewing the procedure, post-operative protocol, and risks of surgery the patient elects for surgical intervention.  She specifically understands the risks of bleeding, infection, nerve damage, blood clots, need for additional surgery, amputation, and even death.  Alfredo Martinez PA-C [facility] Office:  [phone]   No chaNGES to the note above.  To the OR today as planned.  The risks and benefits of the alternative treatment options have been discussed in detail.  The patient wishes to proceed with surgery and specifically understands risks of bleeding, infection, nerve damage, blood clots, need for additional surgery, amputation and death.

## 2023-08-11 NOTE — Discharge Instructions (Addendum)
Toni Arthurs, MD [facility]  Please read the following information regarding your care after surgery.  Medications  You only need a prescription for the narcotic pain medicine (ex. oxycodone, Percocet, Norco).  All of the other medicines listed below are available over the counter. ? Aleve 2 pills twice a day for the first 3 days after surgery. ? acetominophen (Tylenol) 650 mg every 4-6 hours as you need for minor to moderate pain ? oxycodone as prescribed for severe pain  Narcotic pain medicine (ex. oxycodone, Percocet, Vicodin) will cause constipation.  To prevent this problem, take the following medicines while you are taking any pain medicine. ? docusate sodium (Colace) 100 mg twice a day ? senna (Senokot) 2 tablets twice a day  Weight Bearing ? Bear weight only on your operated foot in the CAM boot.   Cast / Splint / Dressing ? Keep your splint, cast or dressing clean and dry.  Don't put anything (coat hanger, pencil, etc) down inside of it.  If it gets damp, use a hair dryer on the cool setting to dry it.  If it gets soaked, call the office to schedule an appointment for a cast change.   After your dressing, cast or splint is removed; you may shower, but do not soak or scrub the wound.  Allow the water to run over it, and then gently pat it dry.  Swelling It is normal for you to have swelling where you had surgery.  To reduce swelling and pain, keep your toes above your nose for at least 3 days after surgery.  It may be necessary to keep your foot or leg elevated for several weeks.  If it hurts, it should be elevated.  Follow Up Call my office at [phone] when you are discharged from the hospital or surgery center to schedule an appointment to be seen two weeks after surgery.  Call my office at [phone] if you develop a fever >101.5 F, nausea, vomiting, bleeding from the surgical site or severe pain.    No Tylenol until after 2:30 pm.

## 2023-08-11 NOTE — Anesthesia Postprocedure Evaluation (Signed)
Anesthesia Post Note  Patient: Kristine Stevens  Procedure(s) Performed: LEFT HALLUX ARTHRODESIS METATARSALPHALANGEAL JOINT (MTPJ) (Left: Foot) SILVER BUNIONECTOMY (Left: Foot)     Patient location during evaluation: PACU Anesthesia Type: General Level of consciousness: sedated and patient cooperative Pain management: pain level controlled Vital Signs Assessment: post-procedure vital signs reviewed and stable Respiratory status: spontaneous breathing Cardiovascular status: stable Anesthetic complications: no   No notable events documented.  Last Vitals:  Vitals:   08/11/23 1100 08/11/23 1115  BP: 116/78 121/80  Pulse: 73   Resp: 18   Temp:  (!) 36.3 C  SpO2: 98% 98%    Last Pain:  Vitals:   08/11/23 1115  TempSrc:   PainSc: 0-No pain                 Lewie Loron

## 2023-08-11 NOTE — Op Note (Signed)
08/11/2023  10:23 AM  PATIENT:  Kristine Stevens  57 y.o. female  PRE-OPERATIVE DIAGNOSIS: 1.  Left hallux rigidus 2.  Painful left foot bunion  POST-OPERATIVE DIAGNOSIS: Same  Procedure(s): 1.  Left forefoot silver bunionectomy 2.  Left hallux MP joint arthrodesis 3.  Left foot AP and lateral radiographs  SURGEON:  Toni Arthurs, MD  ASSISTANT: Alfredo Martinez, PA-C  ANESTHESIA:   General, regional  EBL:  minimal   TOURNIQUET:   Total Tourniquet Time Documented: Thigh (Left) - 28 minutes Total: Thigh (Left) - 28 minutes  COMPLICATIONS:  None apparent  DISPOSITION:  Extubated, awake and stable to recovery.  INDICATION FOR PROCEDURE: 57 year old female without significant past medical history presents for surgical treatment of her painful left foot bunion deformity and hallux rigidus.  She has failed nonoperative treatment including activity modification, oral anti-inflammatories and shoewear modification.  The risks and benefits of the alternative treatment options have been discussed in detail.  The patient wishes to proceed with surgery and specifically understands risks of bleeding, infection, nerve damage, blood clots, need for additional surgery, amputation and death.   PROCEDURE IN DETAIL:  After pre operative consent was obtained, and the correct operative site was identified, the patient was brought to the operating room and placed supine on the OR table.  Anesthesia was administered.  Pre-operative antibiotics were administered.  A surgical timeout was taken.   The left lower extremity was prepped and draped in standard sterile fashion with a tourniquet around the thigh.  The extremity was elevated, and the tourniquet was inflated to 250 mmHg.  A longitudinal incision was made over the hallux MP joint.  Dissection was carried down through the subcutaneous tissues.  The extensor tendons were protected.  The collateral ligaments were released exposing the first metatarsal head.  The  hypertrophic medial eminence was resected with a rondure in line with the first metatarsal shaft.  After the bunion was resected, the remaining articular cartilage and subchondral bone was removed from the head of the metatarsal with a concave reamer.  The base of the proximal phalanx was then exposed.  The remaining articular cartilage and subchondral bone was removed with the convex reamer.  The joint was irrigated copiously.  Both joint surfaces were perforated with a small drill bit leaving the resultant bone graft in place.  The joint was reduced and provisionally pinned.  AP and lateral radiographs confirmed appropriate position of the toe with correction of the intermetatarsal and hallux valgus angles.  A simulated weightbearing examination showed appropriate dorsiflexion of the toe.  The K wire was overdrilled.  A 4 mm stainless steel partially-threaded cannulated screw from [employer] was inserted.  It was noted of excellent purchase and compressed the joint surface appropriately.  A 4-hole one quarter tubular plate from the [employer] mini frag set was then placed over the dorsum of the joint.  It was secured proximally and distally with bicortical 2.7 mm screws.  Final AP and lateral radiographs confirmed appropriate position and length of all hardware and appropriate correction of the bunion deformity and arthrodesis of the joint.  The wound was irrigated copiously and sprinkled with vancomycin powder.  Subcutaneous tissues were approximated with 2-0 Vicryl.  The skin incision was closed with running 3-0 nylon.  Sterile dressings were applied followed by a tall cam boot.  The tourniquet was released after application of the dressings.  The patient was awakened from anesthesia and transported to the recovery room in stable condition.  FOLLOW UP PLAN: Weightbearing as tolerated in a cam boot.  Follow-up in the office in 2 weeks for suture removal.  Plan 6 weeks postoperative immobilization.  No  indication for DVT prophylaxis in this ambulatory patient.   RADIOGRAPHS: AP and lateral radiographs of the left foot are obtained intraoperatively.  These show interval reduction and fixation of the hallux MP joint with correction of the hallux valgus and intermetatarsal angles.  Hardware is appropriately positioned and of the appropriate lengths.  No other acute injuries are noted.    Alfredo Martinez PA-C was present and scrubbed for the duration of the operative case. His assistance was essential in positioning the patient, prepping and draping, gaining and maintaining exposure, performing the operation, closing and dressing the wounds and applying the splint.

## 2023-08-11 NOTE — Anesthesia Procedure Notes (Addendum)
Anesthesia Regional Block: Popliteal block   Pre-Anesthetic Checklist: , timeout performed,  Correct Patient, Correct Site, Correct Laterality,  Correct Procedure, Correct Position, site marked,  Risks and benefits discussed,  Surgical consent,  Pre-op evaluation,  At surgeon's request and post-op pain management  Laterality: Lower and Left  Prep: chloraprep       Needles:  Injection technique: Single-shot  Needle Type: Stimiplex     Needle Length: 10cm  Needle Gauge: 21     Additional Needles:   Procedures:,,,, ultrasound used (permanent image in chart),,   Motor weakness within 5 minutes.  Narrative:  Start time: 08/11/2023 8:50 AM End time: 08/11/2023 9:10 AM Injection made incrementally with aspirations every 5 mL.  Performed by: Personally  Anesthesiologist: Lewie Loron, MD  Additional Notes: Nerve located and needle positioned with direct ultrasound guidance. Good perineural spread. Patient tolerated well.

## 2023-08-11 NOTE — Transfer of Care (Signed)
Immediate Anesthesia Transfer of Care Note  Patient: [name]  Procedure(s) Performed: LEFT HALLUX ARTHRODESIS METATARSALPHALANGEAL JOINT (MTPJ) (Left: Foot) SILVER BUNIONECTOMY (Left: Foot)  Patient Location: PACU  Anesthesia Type:General and Regional  Level of Consciousness: awake  Airway & Oxygen Therapy: Patient Spontanous Breathing and Patient connected to nasal cannula oxygen  Post-op Assessment: Report given to RN and Post -op Vital signs reviewed and stable  Post vital signs: Reviewed and stable  Last Vitals:  Vitals Value Taken Time  BP    Temp    Pulse    Resp    SpO2      Last Pain:  Vitals:   08/11/23 0731  TempSrc: Temporal  PainSc: 0-No pain      Patients Stated Pain Goal: 4 (08/11/23 0731)  Complications: No notable events documented.

## 2023-08-11 NOTE — Anesthesia Preprocedure Evaluation (Addendum)
Anesthesia Evaluation  Patient identified by MRN, date of birth, ID band Patient awake    Reviewed: Allergy & Precautions, NPO status , Patient's Chart, lab work & pertinent test results  History of Anesthesia Complications (+) PONV and history of anesthetic complications  Airway Mallampati: II  TM Distance: >3 FB Neck ROM: Full    Dental no notable dental hx.    Pulmonary neg pulmonary ROS   Pulmonary exam normal breath sounds clear to auscultation       Cardiovascular hypertension, Pt. on medications  Rhythm:Regular Rate:Normal + Systolic murmurs    Neuro/Psych  PSYCHIATRIC DISORDERS Anxiety Depression    negative neurological ROS     GI/Hepatic negative GI ROS, Neg liver ROS,,,  Endo/Other  negative endocrine ROS    Renal/GU negative Renal ROS     Musculoskeletal  (+) Arthritis ,    Abdominal   Peds  Hematology negative hematology ROS (+)   Anesthesia Other Findings   Reproductive/Obstetrics                             Anesthesia Physical Anesthesia Plan  ASA: 2  Anesthesia Plan: General   Post-op Pain Management: Tylenol PO (pre-op)*   Induction: Intravenous  PONV Risk Score and Plan: 4 or greater and Ondansetron, Dexamethasone, Treatment may vary due to age or medical condition, Propofol infusion and Midazolam  Airway Management Planned: LMA  Additional Equipment:   Intra-op Plan:   Post-operative Plan: Extubation in OR  Informed Consent: I have reviewed the patients History and Physical, chart, labs and discussed the procedure including the risks, benefits and alternatives for the proposed anesthesia with the patient or authorized representative who has indicated his/her understanding and acceptance.     Dental advisory given  Plan Discussed with: CRNA  Anesthesia Plan Comments: (Risks of anesthesia explained at length. This includes, but is not limited to, sore  throat, damage to teeth, lips gums, tongue and vocal cords, nausea and vomiting, reactions to medications, stroke, heart attack, and death. All patient questions were answered and the patient wishes to proceed.   Risks of peripheral nerve block explained at length. This includes, but is not limited to, bleeding, infection, reactions to the medications, seizures, damage to surrounding structures, damage to nerves, permanent weakness, numbness, tingling and pain. All patient questions were answered and patient wishes to proceed with nerve block. )        Anesthesia Quick Evaluation

## 2023-08-11 NOTE — Progress Notes (Signed)
Assisted Dr. Lissa Hoard with left, popliteal, ultrasound guided block. Side rails up, monitors on throughout procedure. See vital signs in flow sheet. Tolerated Procedure well.

## 2023-08-11 NOTE — Anesthesia Procedure Notes (Signed)
Procedure Name: LMA Insertion Date/Time: 08/11/2023 9:33 AM  Performed by: Caren Macadam, CRNAPre-anesthesia Checklist: Patient identified, Emergency Drugs available, Suction available and Patient being monitored Patient Re-evaluated:Patient Re-evaluated prior to induction Oxygen Delivery Method: Circle system utilized Preoxygenation: Pre-oxygenation with 100% oxygen Induction Type: IV induction Ventilation: Mask ventilation without difficulty LMA: LMA inserted LMA Size: 4.0 Number of attempts: 1 Placement Confirmation: positive ETCO2 and breath sounds checked- equal and bilateral Tube secured with: Tape Dental Injury: Teeth and Oropharynx as per pre-operative assessment

## 2023-08-12 ENCOUNTER — Encounter (HOSPITAL_BASED_OUTPATIENT_CLINIC_OR_DEPARTMENT_OTHER): Payer: Self-pay | Admitting: Orthopedic Surgery

## 2023-08-16 ENCOUNTER — Other Ambulatory Visit (HOSPITAL_COMMUNITY): Payer: Self-pay

## 2023-08-16 MED ORDER — CYCLOSPORINE 0.05 % OP EMUL
1.0000 [drp] | Freq: Every day | OPHTHALMIC | 6 refills | Status: DC
  Filled 2023-08-16: qty 30, 30d supply, fill #0
  Filled 2023-09-16: qty 30, 30d supply, fill #1
  Filled 2023-12-29: qty 30, 30d supply, fill #2
  Filled 2024-02-21: qty 60, 60d supply, fill #3
  Filled 2024-08-02: qty 60, 60d supply, fill #4
  Filled 2024-08-02: qty 60, 30d supply, fill #0
  Filled 2024-08-03: qty 30, 30d supply, fill #0

## 2023-08-23 ENCOUNTER — Encounter: Payer: Self-pay | Admitting: Internal Medicine

## 2023-08-23 ENCOUNTER — Ambulatory Visit: Payer: Commercial Managed Care - PPO | Attending: Internal Medicine | Admitting: Internal Medicine

## 2023-08-23 VITALS — BP 123/84 | HR 79 | Resp 14 | Ht 61.0 in | Wt 145.0 lb

## 2023-08-23 DIAGNOSIS — M3501 Sicca syndrome with keratoconjunctivitis: Secondary | ICD-10-CM

## 2023-08-23 DIAGNOSIS — M059 Rheumatoid arthritis with rheumatoid factor, unspecified: Secondary | ICD-10-CM | POA: Diagnosis not present

## 2023-08-23 DIAGNOSIS — M549 Dorsalgia, unspecified: Secondary | ICD-10-CM | POA: Diagnosis not present

## 2023-08-23 DIAGNOSIS — Z79899 Other long term (current) drug therapy: Secondary | ICD-10-CM

## 2023-08-24 DIAGNOSIS — M2021 Hallux rigidus, right foot: Secondary | ICD-10-CM | POA: Diagnosis not present

## 2023-08-24 DIAGNOSIS — M21611 Bunion of right foot: Secondary | ICD-10-CM | POA: Diagnosis not present

## 2023-08-26 LAB — CBC WITH DIFFERENTIAL/PLATELET
Absolute Lymphocytes: 1856 {cells}/uL (ref 850–3900)
Absolute Monocytes: 448 {cells}/uL (ref 200–950)
Basophils Absolute: 8 {cells}/uL (ref 0–200)
Basophils Relative: 0.2 %
Eosinophils Absolute: 120 {cells}/uL (ref 15–500)
Eosinophils Relative: 3 %
HCT: 37.6 % (ref 35.0–45.0)
Hemoglobin: 12.2 g/dL (ref 11.7–15.5)
MCH: 26.4 pg — ABNORMAL LOW (ref 27.0–33.0)
MCHC: 32.4 g/dL (ref 32.0–36.0)
MCV: 81.4 fL (ref 80.0–100.0)
MPV: 9.5 fL (ref 7.5–12.5)
Monocytes Relative: 11.2 %
Neutro Abs: 1568 {cells}/uL (ref 1500–7800)
Neutrophils Relative %: 39.2 %
Platelets: 313 10*3/uL (ref 140–400)
RBC: 4.62 10*6/uL (ref 3.80–5.10)
RDW: 14.4 % (ref 11.0–15.0)
Total Lymphocyte: 46.4 %
WBC: 4 10*3/uL (ref 3.8–10.8)

## 2023-08-26 LAB — C-REACTIVE PROTEIN: CRP: <3 mg/L (ref <8.0)

## 2023-08-26 LAB — SEDIMENTATION RATE: Sed Rate: 6 mm/h (ref 0–30)

## 2023-08-26 LAB — CYCLIC CITRUL PEPTIDE ANTIBODY, IGG: Cyclic Citrullin Peptide Ab: 119 U — ABNORMAL HIGH

## 2023-08-27 ENCOUNTER — Other Ambulatory Visit: Payer: Self-pay

## 2023-08-27 ENCOUNTER — Other Ambulatory Visit: Payer: Self-pay | Admitting: Family Medicine

## 2023-08-27 DIAGNOSIS — R7303 Prediabetes: Secondary | ICD-10-CM

## 2023-08-27 DIAGNOSIS — K7581 Nonalcoholic steatohepatitis (NASH): Secondary | ICD-10-CM

## 2023-08-27 DIAGNOSIS — E88819 Insulin resistance, unspecified: Secondary | ICD-10-CM

## 2023-08-29 ENCOUNTER — Other Ambulatory Visit: Payer: Self-pay

## 2023-08-30 ENCOUNTER — Other Ambulatory Visit (HOSPITAL_COMMUNITY): Payer: Self-pay

## 2023-08-30 MED ORDER — OZEMPIC (0.25 OR 0.5 MG/DOSE) 2 MG/3ML ~~LOC~~ SOPN
PEN_INJECTOR | SUBCUTANEOUS | 1 refills | Status: DC
  Filled 2023-08-30 – 2023-09-14 (×2): qty 3, 28d supply, fill #0

## 2023-09-07 ENCOUNTER — Other Ambulatory Visit: Payer: Self-pay | Admitting: Internal Medicine

## 2023-09-07 ENCOUNTER — Other Ambulatory Visit: Payer: Self-pay | Admitting: Family Medicine

## 2023-09-07 DIAGNOSIS — M059 Rheumatoid arthritis with rheumatoid factor, unspecified: Secondary | ICD-10-CM

## 2023-09-07 DIAGNOSIS — E785 Hyperlipidemia, unspecified: Secondary | ICD-10-CM

## 2023-09-07 DIAGNOSIS — M3501 Sicca syndrome with keratoconjunctivitis: Secondary | ICD-10-CM

## 2023-09-08 ENCOUNTER — Other Ambulatory Visit: Payer: Self-pay

## 2023-09-08 ENCOUNTER — Other Ambulatory Visit (HOSPITAL_COMMUNITY): Payer: Self-pay

## 2023-09-08 MED ORDER — TIZANIDINE HCL 2 MG PO TABS
2.0000 mg | ORAL_TABLET | Freq: Three times a day (TID) | ORAL | 0 refills | Status: DC | PRN
  Filled 2023-09-08: qty 30, 5d supply, fill #0

## 2023-09-08 MED ORDER — ICOSAPENT ETHYL 1 G PO CAPS
2.0000 g | ORAL_CAPSULE | Freq: Two times a day (BID) | ORAL | 1 refills | Status: DC
  Filled 2023-09-08: qty 360, 90d supply, fill #0

## 2023-09-08 NOTE — Telephone Encounter (Signed)
Last Fill: 04/06/2023  Eye exam: not on file.   Labs: 08/23/2023 MCH 26.4, Glucose 118  Next Visit: 02/21/2024  Last Visit: 08/23/2023  [id number] rheumatoid arthritis   Current Dose per office note 08/23/2023: Hydroxychloroquine 300mg  daily.   Attempted to contact the patient to remind her about her PLQ eye exam. Unable to leave a message, voicemail full.   Okay to refill Plaquenil?

## 2023-09-09 ENCOUNTER — Other Ambulatory Visit (HOSPITAL_COMMUNITY): Payer: Self-pay

## 2023-09-09 ENCOUNTER — Other Ambulatory Visit: Payer: Self-pay

## 2023-09-09 MED ORDER — HYDROXYCHLOROQUINE SULFATE 200 MG PO TABS
300.0000 mg | ORAL_TABLET | Freq: Every day | ORAL | 0 refills | Status: DC
  Filled 2023-09-09: qty 45, 30d supply, fill #0

## 2023-09-14 ENCOUNTER — Other Ambulatory Visit: Payer: Self-pay | Admitting: Family Medicine

## 2023-09-14 ENCOUNTER — Other Ambulatory Visit: Payer: Self-pay

## 2023-09-14 ENCOUNTER — Other Ambulatory Visit (HOSPITAL_COMMUNITY): Payer: Self-pay

## 2023-09-15 ENCOUNTER — Other Ambulatory Visit (HOSPITAL_COMMUNITY): Payer: Self-pay

## 2023-09-15 MED ORDER — FINACEA 15 % EX FOAM
1.0000 | Freq: Two times a day (BID) | CUTANEOUS | 0 refills | Status: DC
  Filled 2023-09-15: qty 50, 30d supply, fill #0

## 2023-09-19 ENCOUNTER — Other Ambulatory Visit (HOSPITAL_COMMUNITY): Payer: Self-pay

## 2023-09-29 ENCOUNTER — Other Ambulatory Visit (HOSPITAL_COMMUNITY): Payer: Self-pay

## 2023-09-30 ENCOUNTER — Other Ambulatory Visit (HOSPITAL_COMMUNITY): Payer: Self-pay

## 2023-09-30 ENCOUNTER — Encounter: Payer: Self-pay | Admitting: Family Medicine

## 2023-10-03 ENCOUNTER — Other Ambulatory Visit (HOSPITAL_COMMUNITY): Payer: Self-pay

## 2023-10-03 MED ORDER — SEMAGLUTIDE (1 MG/DOSE) 4 MG/3ML ~~LOC~~ SOPN
1.0000 mg | PEN_INJECTOR | SUBCUTANEOUS | 1 refills | Status: DC
  Filled 2023-10-03 – 2023-10-07 (×3): qty 3, 28d supply, fill #0
  Filled 2023-11-02: qty 3, 28d supply, fill #1

## 2023-10-07 ENCOUNTER — Other Ambulatory Visit: Payer: Self-pay

## 2023-10-07 ENCOUNTER — Other Ambulatory Visit (HOSPITAL_COMMUNITY): Payer: Self-pay

## 2023-10-17 ENCOUNTER — Other Ambulatory Visit (HOSPITAL_COMMUNITY): Payer: Self-pay

## 2023-10-17 ENCOUNTER — Encounter (INDEPENDENT_AMBULATORY_CARE_PROVIDER_SITE_OTHER): Payer: Self-pay | Admitting: Family Medicine

## 2023-10-17 DIAGNOSIS — R112 Nausea with vomiting, unspecified: Secondary | ICD-10-CM | POA: Diagnosis not present

## 2023-10-17 MED ORDER — PROMETHAZINE HCL 25 MG PO TABS
25.0000 mg | ORAL_TABLET | Freq: Three times a day (TID) | ORAL | 0 refills | Status: AC | PRN
  Filled 2023-10-17: qty 20, 7d supply, fill #0

## 2023-10-17 MED ORDER — ONDANSETRON 4 MG PO TBDP
4.0000 mg | ORAL_TABLET | Freq: Three times a day (TID) | ORAL | 0 refills | Status: AC | PRN
  Filled 2023-10-17: qty 20, 7d supply, fill #0

## 2023-10-17 NOTE — Telephone Encounter (Signed)
Please see the MyChart message reply(ies) for my assessment and plan.    This patient gave consent for this Medical Advice Message and is aware that it may result in a bill to their insurance company, as well as the possibility of receiving a bill for a co-payment or deductible. They are an established patient, but are not seeking medical advice exclusively about a problem treated during an in person or video visit in the last seven days. I did not recommend an in person or video visit within seven days of my reply.    I spent a total of 6 minutes cumulative time within 7 days through MyChart messaging.  Chenay Nesmith, DO   

## 2023-10-25 ENCOUNTER — Other Ambulatory Visit: Payer: Self-pay | Admitting: Internal Medicine

## 2023-10-25 DIAGNOSIS — M059 Rheumatoid arthritis with rheumatoid factor, unspecified: Secondary | ICD-10-CM

## 2023-10-25 DIAGNOSIS — M3501 Sicca syndrome with keratoconjunctivitis: Secondary | ICD-10-CM

## 2023-10-25 NOTE — Telephone Encounter (Signed)
Last Fill: 09/09/2023  Eye exam: not on file   Labs: 07/29/2023  Glucose 118 MCH 26.4  Next Visit: 02/21/2024  Last Visit: 08/23/2023  [id number] rheumatoid arthritis   Current Dose per office note 08/23/2023: hydroxychloroquine 300 mg daily  Attempted to contact the patient regarding a PLQ eye exam. Could not leave a message because the mailbox was full.   Okay to refill Plaquenil?

## 2023-10-26 ENCOUNTER — Other Ambulatory Visit (HOSPITAL_COMMUNITY): Payer: Self-pay

## 2023-10-26 MED ORDER — HYDROXYCHLOROQUINE SULFATE 200 MG PO TABS
300.0000 mg | ORAL_TABLET | Freq: Every day | ORAL | 1 refills | Status: DC
  Filled 2023-10-26: qty 135, 90d supply, fill #0
  Filled 2024-02-21: qty 135, 90d supply, fill #1

## 2023-11-02 ENCOUNTER — Other Ambulatory Visit (HOSPITAL_COMMUNITY): Payer: Self-pay

## 2023-11-30 ENCOUNTER — Other Ambulatory Visit (HOSPITAL_COMMUNITY): Payer: Self-pay

## 2023-11-30 ENCOUNTER — Encounter (HOSPITAL_COMMUNITY): Payer: Self-pay

## 2023-11-30 ENCOUNTER — Other Ambulatory Visit: Payer: Self-pay | Admitting: Family Medicine

## 2023-11-30 MED ORDER — TIZANIDINE HCL 2 MG PO TABS
2.0000 mg | ORAL_TABLET | Freq: Three times a day (TID) | ORAL | 0 refills | Status: DC | PRN
  Filled 2023-11-30: qty 30, 5d supply, fill #0

## 2023-11-30 MED ORDER — OZEMPIC (1 MG/DOSE) 4 MG/3ML ~~LOC~~ SOPN
1.0000 mg | PEN_INJECTOR | SUBCUTANEOUS | 1 refills | Status: DC
  Filled 2023-11-30: qty 3, 28d supply, fill #0
  Filled 2023-12-29: qty 3, 28d supply, fill #1

## 2023-11-30 MED ORDER — FINACEA 15 % EX FOAM
1.0000 | Freq: Two times a day (BID) | CUTANEOUS | 0 refills | Status: DC
  Filled 2023-11-30: qty 50, 30d supply, fill #0

## 2023-12-01 ENCOUNTER — Other Ambulatory Visit (HOSPITAL_COMMUNITY): Payer: Self-pay

## 2023-12-01 ENCOUNTER — Other Ambulatory Visit: Payer: Self-pay

## 2023-12-14 ENCOUNTER — Encounter: Payer: Self-pay | Admitting: Family Medicine

## 2023-12-14 DIAGNOSIS — R7303 Prediabetes: Secondary | ICD-10-CM

## 2023-12-14 DIAGNOSIS — Z8639 Personal history of other endocrine, nutritional and metabolic disease: Secondary | ICD-10-CM

## 2023-12-14 DIAGNOSIS — I10 Essential (primary) hypertension: Secondary | ICD-10-CM

## 2023-12-20 DIAGNOSIS — R7303 Prediabetes: Secondary | ICD-10-CM | POA: Diagnosis not present

## 2023-12-20 DIAGNOSIS — I10 Essential (primary) hypertension: Secondary | ICD-10-CM | POA: Diagnosis not present

## 2023-12-20 DIAGNOSIS — Z8639 Personal history of other endocrine, nutritional and metabolic disease: Secondary | ICD-10-CM | POA: Diagnosis not present

## 2023-12-21 LAB — CMP14+EGFR
ALT: 13 IU/L (ref 0–32)
AST: 27 IU/L (ref 0–40)
Albumin: 4.5 g/dL (ref 3.8–4.9)
Alkaline Phosphatase: 77 IU/L (ref 44–121)
BUN/Creatinine Ratio: 15 (ref 9–23)
BUN: 11 mg/dL (ref 6–24)
Bilirubin Total: 0.4 mg/dL (ref 0.0–1.2)
CO2: 26 mmol/L (ref 20–29)
Calcium: 9.3 mg/dL (ref 8.7–10.2)
Chloride: 101 mmol/L (ref 96–106)
Creatinine, Ser: 0.72 mg/dL (ref 0.57–1.00)
Globulin, Total: 2.8 g/dL (ref 1.5–4.5)
Glucose: 88 mg/dL (ref 70–99)
Potassium: 4 mmol/L (ref 3.5–5.2)
Sodium: 140 mmol/L (ref 134–144)
Total Protein: 7.3 g/dL (ref 6.0–8.5)
eGFR: 97 mL/min/{1.73_m2} (ref >59)

## 2023-12-21 LAB — CBC WITH DIFFERENTIAL/PLATELET
Basophils Absolute: 0 10*3/uL (ref 0.0–0.2)
Basos: 0 %
EOS (ABSOLUTE): 0.1 10*3/uL (ref 0.0–0.4)
Eos: 2 %
Hematocrit: 38.1 % (ref 34.0–46.6)
Hemoglobin: 12.7 g/dL (ref 11.1–15.9)
Immature Grans (Abs): 0 10*3/uL (ref 0.0–0.1)
Immature Granulocytes: 0 %
Lymphocytes Absolute: 1.8 10*3/uL (ref 0.7–3.1)
Lymphs: 50 %
MCH: 27.9 pg (ref 26.6–33.0)
MCHC: 33.3 g/dL (ref 31.5–35.7)
MCV: 84 fL (ref 79–97)
Monocytes Absolute: 0.3 10*3/uL (ref 0.1–0.9)
Monocytes: 9 %
Neutrophils Absolute: 1.4 10*3/uL (ref 1.4–7.0)
Neutrophils: 39 %
Platelets: 307 10*3/uL (ref 150–450)
RBC: 4.56 x10E6/uL (ref 3.77–5.28)
RDW: 13.4 % (ref 11.7–15.4)
WBC: 3.7 10*3/uL (ref 3.4–10.8)

## 2023-12-21 LAB — LIPID PANEL WITH LDL/HDL RATIO
Cholesterol, Total: 230 mg/dL — ABNORMAL HIGH (ref 100–199)
HDL: 70 mg/dL (ref >39)
LDL Chol Calc (NIH): 149 mg/dL — ABNORMAL HIGH (ref 0–99)
LDL/HDL Ratio: 2.1 ratio (ref 0.0–3.2)
Triglycerides: 64 mg/dL (ref 0–149)
VLDL Cholesterol Cal: 11 mg/dL (ref 5–40)

## 2023-12-21 LAB — HEMOGLOBIN A1C
Est. average glucose Bld gHb Est-mCnc: 117 mg/dL
Hgb A1c MFr Bld: 5.7 % — ABNORMAL HIGH (ref 4.8–5.6)

## 2023-12-22 ENCOUNTER — Encounter: Payer: Self-pay | Admitting: Family Medicine

## 2023-12-22 ENCOUNTER — Ambulatory Visit (INDEPENDENT_AMBULATORY_CARE_PROVIDER_SITE_OTHER): Admitting: Family Medicine

## 2023-12-22 VITALS — BP 130/82 | HR 75 | Ht 61.0 in | Wt 134.0 lb

## 2023-12-22 DIAGNOSIS — Z0000 Encounter for general adult medical examination without abnormal findings: Secondary | ICD-10-CM | POA: Insufficient documentation

## 2023-12-22 NOTE — Progress Notes (Signed)
 Pt has been PAP Smear due. States she will schedule with OB/GYN.

## 2023-12-22 NOTE — Progress Notes (Signed)
 Kristine Stevens - 58 y.o. female MRN [id number]  Date of birth: [birth year]  Subjective Chief Complaint  Patient presents with   Annual Exam    HPI Kristine Stevens is a 58 y.o. female here today for annual exam.   She reports that she is doing well.  Weight is down about 10-12 lbs since visit in November.  She has been using Ozempic for prediabetes and management of weight.  She denies significant side effects with this.  She is worked on dietary changes as well as increasing activity.    She is a non-smoker.  Rare EtOH use.   Review of Systems  Constitutional:  Negative for chills, fever, malaise/fatigue and weight loss.  HENT:  Negative for congestion, ear pain and sore throat.   Eyes:  Negative for blurred vision, double vision and pain.  Respiratory:  Negative for cough and shortness of breath.   Cardiovascular:  Negative for chest pain and palpitations.  Gastrointestinal:  Negative for abdominal pain, blood in stool, constipation, heartburn and nausea.  Genitourinary:  Negative for dysuria and urgency.  Musculoskeletal:  Negative for joint pain and myalgias.  Neurological:  Negative for dizziness and headaches.  Endo/Heme/Allergies:  Does not bruise/bleed easily.  Psychiatric/Behavioral:  Negative for depression. The patient is not nervous/anxious and does not have insomnia.     No Known Allergies  Past Medical History:  Diagnosis Date   GERD (gastroesophageal reflux disease)    Heartburn    Hyperlipidemia    Hypertension    IBS (irritable bowel syndrome)    PONV (postoperative nausea and vomiting)    severe   Pre-diabetes    Prediabetes    RA (rheumatoid arthritis) (HCC)    Rosacea    Sjogren's syndrome (HCC)    Sphincter of Oddi dysfunction     Past Surgical History:  Procedure Laterality Date   ABDOMINOPLASTY  05/25/2012   ARTHRODESIS METATARSALPHALANGEAL JOINT (MTPJ) Left 08/11/2023   Procedure: LEFT HALLUX ARTHRODESIS METATARSALPHALANGEAL JOINT (MTPJ);   Surgeon: Toni Arthurs, MD;  Location: [facility];  Service: Orthopedics;  Laterality: Left;   BUNIONECTOMY Left 08/11/2023   Procedure: Andree Moro;  Surgeon: Toni Arthurs, MD;  Location: [facility];  Service: Orthopedics;  Laterality: Left;   CESAREAN SECTION  1991, 1998   x2   CHOLECYSTECTOMY  1995   IRRIGATION AND DEBRIDEMENT ABSCESS Right 05/22/2013   Procedure: MINOR INCISION AND DRAINAGE OF ABSCESS;  Surgeon: Nicki Reaper, MD;  Location:  SURGERY CENTER;  Service: Orthopedics;  Laterality: Right;   KNEE ARTHROSCOPY Left    x3   LAPAROSCOPIC GASTRIC SLEEVE RESECTION N/A 03/25/2015   Procedure: LAPAROSCOPIC GASTRIC SLEEVE RESECTION UPPER ENDOSCOPY;  Surgeon: Luretha Murphy, MD;  Location: [facility];  Service: General;  Laterality: N/A;   VAGINAL BIRTH AFTER CESAREAN SECTION  1993, 2001   x2    Social History   Socioeconomic History   Marital status: Married    Spouse name: Not on file   Number of children: Not on file   Years of education: Not on file   Highest education level: Master's degree (e.g., MA, MS, MEng, MEd, MSW, MBA)  Occupational History   Not on file  Tobacco Use   Smoking status: Never    Passive exposure: Never   Smokeless tobacco: Never  Vaping Use   Vaping status: Never Used  Substance and Sexual Activity   Alcohol use: Yes    Comment: rare   Drug use: No  Sexual activity: Yes  Other Topics Concern   Not on file  Social History Narrative   Not on file   Social Drivers of Health   Financial Resource Strain: Low Risk  (12/22/2023)   Overall Financial Resource Strain (CARDIA)    Difficulty of Paying Living Expenses: Not hard at all  Food Insecurity: No Food Insecurity (12/22/2023)   Hunger Vital Sign    Worried About Running Out of Food in the Last Year: Never true    Ran Out of Food in the Last Year: Never true  Transportation Needs: No Transportation Needs (12/22/2023)   PRAPARE - Doctor, general practice (Medical): No    Lack of Transportation (Non-Medical): No  Physical Activity: Sufficiently Active (12/22/2023)   Exercise Vital Sign    Days of Exercise per Week: 6 days    Minutes of Exercise per Session: 50 min  Stress: No Stress Concern Present (12/22/2023)   [employer] of Occupational Health - Occupational Stress Questionnaire    Feeling of Stress : Only a little  Social Connections: Socially Integrated (12/22/2023)   Social Connection and Isolation Panel [NHANES]    Frequency of Communication with Friends and Family: Three times a week    Frequency of Social Gatherings with Friends and Family: Three times a week    Attends Religious Services: More than 4 times per year    Active Member of Clubs or Organizations: Yes    Attends Engineer, structural: More than 4 times per year    Marital Status: Married    Family History  Problem Relation Age of Onset   Hypertension Mother    Hyperlipidemia Mother    Fibromyalgia Mother    Parkinson's disease Mother    Rheum arthritis Mother    Hyperlipidemia Father    Hypertension Father    Parkinson's disease Brother    Mental retardation Brother    Hyperlipidemia Brother    Breast cancer Maternal Grandmother 51   Healthy Daughter    Breast cancer Cousin 45   Colon cancer Neg Hx    Esophageal cancer Neg Hx    Rectal cancer Neg Hx    Stomach cancer Neg Hx     Health Maintenance  Topic Date Due   Cervical Cancer Screening (HPV/Pap Cotest)  04/13/2019   COVID-19 Vaccine (2 - Janssen risk series) 06/08/2024 (Originally 01/30/2020)   Hepatitis C Screening  12/21/2024 (Originally 11/22/1984)   HIV Screening  12/21/2024 (Originally 11/22/1981)   MAMMOGRAM  08/09/2024   DTaP/Tdap/Td (2 - Td or Tdap) 03/22/2027   Colonoscopy  05/26/2027   INFLUENZA VACCINE  Completed   Zoster Vaccines- Shingrix  Completed   HPV VACCINES  Aged Out      ----------------------------------------------------------------------------------------------------------------------------------------------------------------------------------------------------------------- Physical Exam BP 130/82 (BP Location: Left Arm, Patient Position: Sitting, Cuff Size: Large)   Pulse 75   Ht 5\' 1"  (1.549 m)   Wt 134 lb (60.8 kg)   SpO2 98%   BMI 25.32 kg/m   Physical Exam Constitutional:      General: She is not in acute distress. HENT:     Head: Normocephalic and atraumatic.     Right Ear: Tympanic membrane and ear canal normal.     Left Ear: Tympanic membrane and ear canal normal.     Nose: Nose normal.  Eyes:     General: No scleral icterus.    Conjunctiva/sclera: Conjunctivae normal.  Neck:     Thyroid: No thyromegaly.  Cardiovascular:  Rate and Rhythm: Normal rate and regular rhythm.     Heart sounds: Normal heart sounds.  Pulmonary:     Effort: Pulmonary effort is normal.     Breath sounds: Normal breath sounds.  Abdominal:     General: Bowel sounds are normal. There is no distension.     Palpations: Abdomen is soft.     Tenderness: There is no abdominal tenderness. There is no guarding.  Musculoskeletal:        General: Normal range of motion.     Cervical back: Normal range of motion and neck supple.  Lymphadenopathy:     Cervical: No cervical adenopathy.  Skin:    General: Skin is warm and dry.     Findings: No rash.  Neurological:     General: No focal deficit present.     Mental Status: She is alert and oriented to person, place, and time.     Cranial Nerves: No cranial nerve deficit.     Coordination: Coordination normal.  Psychiatric:        Mood and Affect: Mood normal.        Behavior: Behavior normal.      ------------------------------------------------------------------------------------------------------------------------------------------------------------------------------------------------------------------- Assessment and Plan  Well adult exam Well adult Recent labs reviewed with her.  Immunizations:  UTD Screenings:  She will schedule pap.  Anticipatory guidance/Risk factor reduction:  Recommendations per AVS.    No orders of the defined types were placed in this encounter.   No follow-ups on file.    This visit occurred during the SARS-CoV-2 public health emergency.  Safety protocols were in place, including screening questions prior to the visit, additional usage of staff PPE, and extensive cleaning of exam room while observing appropriate contact time as indicated for disinfecting solutions.

## 2023-12-22 NOTE — Patient Instructions (Signed)
 Preventive Care 16-57 Years Old, Female  Preventive care refers to lifestyle choices and visits with your health care provider that can promote health and wellness. Preventive care visits are also called wellness exams.  What can I expect for my preventive care visit?  Counseling  Your health care provider may ask you questions about your:  Medical history, including:  Past medical problems.  Family medical history.  Pregnancy history.  Current health, including:  Menstrual cycle.  Method of birth control.  Emotional well-being.  Home life and relationship well-being.  Sexual activity and sexual health.  Lifestyle, including:  Alcohol, nicotine or tobacco, and drug use.  Access to firearms.  Diet, exercise, and sleep habits.  Work and work Astronomer.  Sunscreen use.  Safety issues such as seatbelt and bike helmet use.  Physical exam  Your health care provider will check your:  Height and weight. These may be used to calculate your BMI (body mass index). BMI is a measurement that tells if you are at a healthy weight.  Waist circumference. This measures the distance around your waistline. This measurement also tells if you are at a healthy weight and may help predict your risk of certain diseases, such as type 2 diabetes and high blood pressure.  Heart rate and blood pressure.  Body temperature.  Skin for abnormal spots.  What immunizations do I need?    Vaccines are usually given at various ages, according to a schedule. Your health care provider will recommend vaccines for you based on your age, medical history, and lifestyle or other factors, such as travel or where you work.  What tests do I need?  Screening  Your health care provider may recommend screening tests for certain conditions. This may include:  Lipid and cholesterol levels.  Diabetes screening. This is done by checking your blood sugar (glucose) after you have not eaten for a while (fasting).  Pelvic exam and Pap test.  Hepatitis B test.  Hepatitis C  test.  HIV (human immunodeficiency virus) test.  STI (sexually transmitted infection) testing, if you are at risk.  Lung cancer screening.  Colorectal cancer screening.  Mammogram. Talk with your health care provider about when you should start having regular mammograms. This may depend on whether you have a family history of breast cancer.  BRCA-related cancer screening. This may be done if you have a family history of breast, ovarian, tubal, or peritoneal cancers.  Bone density scan. This is done to screen for osteoporosis.  Talk with your health care provider about your test results, treatment options, and if necessary, the need for more tests.  Follow these instructions at home:  Eating and drinking    Eat a diet that includes fresh fruits and vegetables, whole grains, lean protein, and low-fat dairy products.  Take vitamin and mineral supplements as recommended by your health care provider.  Do not drink alcohol if:  Your health care provider tells you not to drink.  You are pregnant, may be pregnant, or are planning to become pregnant.  If you drink alcohol:  Limit how much you have to 0-1 drink a day.  Know how much alcohol is in your drink. In the [place] one drink equals one 12 oz bottle of beer (355 mL), one 5 oz glass of wine (148 mL), or one 1 oz glass of hard liquor (44 mL).  Lifestyle  Brush your teeth every morning and night with fluoride toothpaste. Floss one time each day.  Exercise for at least  30 minutes 5 or more days each week.  Do not use any products that contain nicotine or tobacco. These products include cigarettes, chewing tobacco, and vaping devices, such as e-cigarettes. If you need help quitting, ask your health care provider.  Do not use drugs.  If you are sexually active, practice safe sex. Use a condom or other form of protection to prevent STIs.  If you do not wish to become pregnant, use a form of birth control. If you plan to become pregnant, see your health care provider for a  prepregnancy visit.  Take aspirin only as told by your health care provider. Make sure that you understand how much to take and what form to take. Work with your health care provider to find out whether it is safe and beneficial for you to take aspirin daily.  Find healthy ways to manage stress, such as:  Meditation, yoga, or listening to music.  Journaling.  Talking to a trusted person.  Spending time with friends and family.  Minimize exposure to UV radiation to reduce your risk of skin cancer.  Safety  Always wear your seat belt while driving or riding in a vehicle.  Do not drive:  If you have been drinking alcohol. Do not ride with someone who has been drinking.  When you are tired or distracted.  While texting.  If you have been using any mind-altering substances or drugs.  Wear a helmet and other protective equipment during sports activities.  If you have firearms in your house, make sure you follow all gun safety procedures.  Seek help if you have been physically or sexually abused.  What's next?  Visit your health care provider once a year for an annual wellness visit.  Ask your health care provider how often you should have your eyes and teeth checked.  Stay up to date on all vaccines.  This information is not intended to replace advice given to you by your health care provider. Make sure you discuss any questions you have with your health care provider.  Document Revised: 03/11/2021 Document Reviewed: 03/11/2021  Elsevier Patient Education  2024 [employer].

## 2023-12-22 NOTE — Assessment & Plan Note (Signed)
 Well adult Recent labs reviewed with her.  Immunizations:  UTD Screenings:  She will schedule pap.  Anticipatory guidance/Risk factor reduction:  Recommendations per AVS.

## 2023-12-27 ENCOUNTER — Encounter: Admitting: Family Medicine

## 2023-12-29 ENCOUNTER — Other Ambulatory Visit: Payer: Self-pay

## 2023-12-29 ENCOUNTER — Other Ambulatory Visit: Payer: Self-pay | Admitting: Family Medicine

## 2023-12-29 ENCOUNTER — Other Ambulatory Visit (HOSPITAL_COMMUNITY): Payer: Self-pay

## 2023-12-29 DIAGNOSIS — F4321 Adjustment disorder with depressed mood: Secondary | ICD-10-CM

## 2023-12-29 MED ORDER — GABAPENTIN 300 MG PO CAPS
600.0000 mg | ORAL_CAPSULE | Freq: Every evening | ORAL | 3 refills | Status: AC | PRN
  Filled 2023-12-29: qty 180, 90d supply, fill #0

## 2023-12-29 MED ORDER — CITALOPRAM HYDROBROMIDE 20 MG PO TABS
30.0000 mg | ORAL_TABLET | Freq: Every day | ORAL | 1 refills | Status: DC
  Filled 2023-12-29: qty 135, 90d supply, fill #0
  Filled 2024-04-30: qty 135, 90d supply, fill #1

## 2023-12-30 ENCOUNTER — Other Ambulatory Visit (HOSPITAL_COMMUNITY): Payer: Self-pay

## 2024-01-18 ENCOUNTER — Other Ambulatory Visit (HOSPITAL_COMMUNITY): Payer: Self-pay

## 2024-01-18 ENCOUNTER — Other Ambulatory Visit: Payer: Self-pay | Admitting: Family Medicine

## 2024-01-18 MED ORDER — OZEMPIC (1 MG/DOSE) 4 MG/3ML ~~LOC~~ SOPN
1.0000 mg | PEN_INJECTOR | SUBCUTANEOUS | 1 refills | Status: DC
  Filled 2024-01-18 – 2024-01-23 (×2): qty 3, 28d supply, fill #0
  Filled 2024-02-21: qty 3, 28d supply, fill #1

## 2024-01-23 ENCOUNTER — Other Ambulatory Visit (HOSPITAL_COMMUNITY): Payer: Self-pay

## 2024-01-23 DIAGNOSIS — G8929 Other chronic pain: Secondary | ICD-10-CM | POA: Diagnosis not present

## 2024-01-23 DIAGNOSIS — M05771 Rheumatoid arthritis with rheumatoid factor of right ankle and foot without organ or systems involvement: Secondary | ICD-10-CM | POA: Diagnosis not present

## 2024-01-23 DIAGNOSIS — M2021 Hallux rigidus, right foot: Secondary | ICD-10-CM | POA: Diagnosis not present

## 2024-01-23 DIAGNOSIS — M2022 Hallux rigidus, left foot: Secondary | ICD-10-CM | POA: Diagnosis not present

## 2024-01-30 ENCOUNTER — Other Ambulatory Visit (HOSPITAL_COMMUNITY): Payer: Self-pay

## 2024-02-07 NOTE — Progress Notes (Signed)
 Office Visit Note  Patient: Kristine Stevens             Date of Birth: 1966-08-02           MRN: [id number]             PCP: Adela Holter, DO Referring: Adela Holter, DO Visit Date: 02/21/2024   Subjective:  Follow-up    Discussed the use of AI scribe software for clinical note transcription with the patient, who gave verbal consent to proceed.  History of Present Illness   Kristine Stevens is a 58 y.o. female here for follow up with Sjogren's syndrome seropositive RA characterized by intermittent fever and previous CNS inflammation on hydroxychloroquine  300 mg daily.    She experiences increased pain and weakness in her right hand, particularly in the MCP joint of her index finger, which has worsened to the point where she cannot unscrew bottles or jars. The pain is more severe in the right hand compared to the left, with additional discomfort in the thumb and wrist. She also has pain in her elbows, more on the right than the left.  She recently underwent surgery on her left elbow, which is healing well. She received a cortisone injection in the right elbow, which provided minimal relief.  She experiences pain in her spine, which is most disruptive at night, affecting her sleep. She often sleeps in a recliner to alleviate discomfort, which in turn causes neck pain. She takes gabapentin  at night but reports it no longer provides significant relief. She occasionally uses Ultracet  for severe pain, especially after physical activity [place] her [place].  She has a history of weight loss surgery in 2016, which limits her use of NSAIDs. She takes Tylenol  as needed for pain relief. Her weight is currently between 128 and 130 pounds, which is at the lower limit for her current dose of hydroxychloroquine .  No recent significant illnesses or infections. No recent respiratory or gastrointestinal infections. Reports pain in hands, feet, neck, and knees, with nighttime pain affecting sleep.        Previous HPI 08/23/2023 Kristine Stevens is a 58 y.o. female here for follow up with Sjogren's syndrome seropositive RA characterized by intermittent fever and previous CNS inflammation on hydroxychloroquine  300 mg daily.  She presents with increased joint pain in the hands and elbows. They report that the pain has been severe enough to disrupt sleep and is more noticeable during periods of rest. Both elbows are affected, with the right being more painful than the left. The patient describes the elbows as stiff and painful to rest on surfaces. Upon waking, the patient experiences stiffness in both hands, particularly in the right hand, which requires assistance from the left hand to open and close. The stiffness tends to improve as the patient progresses through the day, but returns if the patient remains still for extended periods.    The patient also reports decreased hand strength, making tasks such as opening soda bottles or jars difficult. However, they deny any associated numbness or increased incidence of dropping items. No visible joint swelling has been observed.   The patient is currently on hydroxychloroquine  300mg  and takes gabapentin  600mg  at night, as well as trazodone  25mg  to aid sleep. Gabapentin  helps but does not take at night if working the next day due to residual drowsiness. Trazodone  helps but has side effects if taking high dose.   The patient also reports a period of approximately one week to ten days  about a month ago when they felt like they were in a 'subacute flare', experiencing increased pain, fatigue, and sleep disturbances. They chose to endure this period without seeking medical attention.   The patient is also on Vascepa , a prescription-strength fish oil supplement, and takes a calcium supplement. They have previously used topical diclofenac  for Achilles tendonitis and still have some available for use.      Previous HPI 02/08/2023 Kristine Stevens is a 58 y.o. female  here for follow-up with Sjogren's syndrome seropositive RA characterized by intermittent fever and previous CNS inflammation on hydroxychloroquine  300 mg daily.  She has been doing worse for the past 3 to 4 weeks particularly with pain in her neck and upper back.  This pain is most bothersome at nighttime causing difficulty sleeping and some sleep fragmentation.  She was prescribed tizanidine  and Ultracet  for this but with only a limited response to the treatment.  In addition to the gabapentin  600 mg at night which she has been on previously.  Did not recall any specific injury or activity change to account for increased symptoms.  Does not have widespread increase of symptoms and no recurrence of fever or neurologic symptoms.  She does feel her fatigue has been worse but not sure if this is related to the sleep disruption.   Labs reviewed 11/09/22 Kristine Stevens is a 58 y.o. female here for follow up for Sjogren syndrome and seropositive RA with intermittent fever and previous CNS inflammation after starting hydroxychloroquine  200 mg daily.  She had another flareup between Christmas and New Year's pretty debilitating spent the majority of 4 days in bed.  She only had low fevers during this time but also severe fatigue.  She is having fairly diffuse pain at nighttime disrupting her sleep more than half of the nights.  Gabapentin  and low-dose tramadol  have been helpful for sleeping through the night.  May be see some benefit with muscle relaxer medication but avoid this due to getting a drowsiness or hangover effect into the following day.  Not seeing any obvious peripheral joint swelling.  So far she is not sure whether hydroxychloroquine  is making much difference although the febrile episode was much less severe compared to the that landed her in the hospital last year.   08/25/22 Kristine Stevens is a 58 y.o. female here for follow up for sjogren syndrome and suspected seropositive RA. Lab evaluation at initial  visit showing increased CCP Ab titer but normal inflammatory markers. She continues to experience bilateral hand stiffness and swelling most severe in the morning. Also having pain at the left great toe and schedule to see orthopedic surgery clinic for this. She still feels fatigued similar as before. She had recent ophthalmology follow up and left eye amniotic membrane procedure for dry eye.   Kristine Stevens is a 58 y.o. female here for evaluation of fevers and pain associated with positive CCP Abs and history of sjogren syndrome. She was recently hospitalized with SIRS and suspected infection but no organism identified also concerned for underlying autoimmune process. She completed a course of doxycycline  for possible tickborne illness. CMV testing was positive for IgG and IgM. Ferritin low at 11 with findings consistent for IDA.    Symptoms started with severe low back pain then progressed to whole spine and headache and fevers. After one week hospitalization symptoms improved and has returned mostly to baseline. Still has pain in the low back more frequently than before. Notices left hip pain pretty  regularly and popping or cracking sensation in her thoracic spine. Stiffness and pain commonly in both feet this lasts for about 1 hour also gets elbows and hands for 30-60 minutes. She takes ibuprofen rarely due to previous bariatric surgery. Takes gabapentin  600 mg at night when she is not working which helps partially.   She has a history of gastric sleeve surgery and has some chronic diarrhea but this was more related to sphincter of Oddi dysfunction before this too. She had 3 left knee surgeries she had meniscal injury there as a teenager. She has a family history of RA in her mother who had deforming disease involving both hands.   Labs reviewed 03/2022 ANA neg RF neg CCP 81 Ferritin 11 Lyme neg Erlichiosis IgM equivocal CMV IgG pos IgM pos   Review of Systems  Constitutional:  Positive for  fatigue.  HENT:  Positive for mouth sores and mouth dryness.   Eyes:  Positive for dryness.  Respiratory:  Negative for shortness of breath.   Cardiovascular:  Negative for chest pain and palpitations.  Gastrointestinal:  Negative for blood in stool, constipation and diarrhea.  Endocrine: Negative for increased urination.  Genitourinary:  Negative for involuntary urination.  Musculoskeletal:  Positive for joint pain, joint pain, joint swelling, myalgias, muscle weakness, morning stiffness, muscle tenderness and myalgias. Negative for gait problem.  Skin:  Positive for sensitivity to sunlight. Negative for color change, rash and hair loss.  Allergic/Immunologic: Negative for susceptible to infections.  Neurological:  Positive for headaches. Negative for dizziness.  Hematological:  Negative for swollen glands.  Psychiatric/Behavioral:  Positive for sleep disturbance. Negative for depressed mood. The patient is not nervous/anxious.     PMFS History:  Patient Active Problem List   Diagnosis Date Noted   Well adult exam 12/22/2023   Upper back pain 02/08/2023   At increased risk for cardiovascular disease 12/21/2022   Seropositive rheumatoid arthritis (HCC) 07/22/2022   Joint pain 06/02/2022   FUO (fever of unknown origin) 04/19/2022   Sjogren's syndrome (HCC) 04/03/2022   Rosacea 02/04/2022   Encounter for counseling 02/04/2022   Essential hypertension 09/27/2021   Insomnia secondary to depression with anxiety 09/27/2021   Tick bite 01/25/2021   Statin intolerance 04/12/2019   Sicca syndrome (HCC) 02/05/2019   Elevated transaminase measurement 11/02/2017   History of high cholesterol 05/27/2016   History of gestational diabetes 05/27/2016   History of acute pancreatitis 05/27/2016   Prediabetes 04/17/2015   Status post laparoscopic sleeve gastrectomy June 2016 03/25/2015   Hyperlipemia 01/02/2015   Dysfunction of sphincter of Oddi 08/16/2013    Past Medical History:  Diagnosis  Date   GERD (gastroesophageal reflux disease)    Heartburn    Hyperlipidemia    Hypertension    IBS (irritable bowel syndrome)    PONV (postoperative nausea and vomiting)    severe   Pre-diabetes    Prediabetes    RA (rheumatoid arthritis) (HCC)    Rosacea    Sjogren's syndrome (HCC)    Sphincter of Oddi dysfunction     Family History  Problem Relation Age of Onset   Hypertension Mother    Hyperlipidemia Mother    Fibromyalgia Mother    Parkinson's disease Mother    Rheum arthritis Mother    Hyperlipidemia Father    Hypertension Father    Parkinson's disease Brother    Mental retardation Brother    Hyperlipidemia Brother    Breast cancer Maternal Grandmother 62   Healthy Daughter  Breast cancer Cousin 45   Colon cancer Neg Hx    Esophageal cancer Neg Hx    Rectal cancer Neg Hx    Stomach cancer Neg Hx    Past Surgical History:  Procedure Laterality Date   ABDOMINOPLASTY  05/25/2012   ARTHRODESIS METATARSALPHALANGEAL JOINT (MTPJ) Left 08/11/2023   Procedure: LEFT HALLUX ARTHRODESIS METATARSALPHALANGEAL JOINT (MTPJ);  Surgeon: Amada Backer, MD;  Location: [facility];  Service: Orthopedics;  Laterality: Left;   BUNIONECTOMY Left 08/11/2023   Procedure: Lawton Price;  Surgeon: Amada Backer, MD;  Location: [facility];  Service: Orthopedics;  Laterality: Left;   CESAREAN SECTION  1991, 1998   x2   CHOLECYSTECTOMY  1995   IRRIGATION AND DEBRIDEMENT ABSCESS Right 05/22/2013   Procedure: MINOR INCISION AND DRAINAGE OF ABSCESS;  Surgeon: Kemp Patter, MD;  Location: [facility];  Service: Orthopedics;  Laterality: Right;   KNEE ARTHROSCOPY Left    x3   LAPAROSCOPIC GASTRIC SLEEVE RESECTION N/A 03/25/2015   Procedure: LAPAROSCOPIC GASTRIC SLEEVE RESECTION UPPER ENDOSCOPY;  Surgeon: Jacolyn Matar, MD;  Location: [facility];  Service: General;  Laterality: N/A;   VAGINAL BIRTH AFTER CESAREAN SECTION  1993, 2001   x2   Social  History   Social History Narrative   Not on file   Immunization History  Administered Date(s) Administered   Influenza,inj,Quad PF,6+ Mos 05/27/2016, 06/27/2017, 06/04/2020, 06/02/2022   Influenza-Unspecified 07/02/2013, 07/26/2014, 06/28/2019, 06/21/2023   Janssen (J&J) SARS-COV-2 Vaccination 01/02/2020   Tdap 03/21/2017   Zoster Recombinant(Shingrix) 06/04/2020, 09/16/2020     Objective: Vital Signs: BP 121/75 (BP Location: Left Arm, Patient Position: Sitting, Cuff Size: Normal)   Pulse 73   Resp 14   Ht 5\' 1"  (1.549 m)   Wt 133 lb (60.3 kg)   BMI 25.13 kg/m    Physical Exam Eyes:     Conjunctiva/sclera: Conjunctivae normal.  Cardiovascular:     Rate and Rhythm: Normal rate and regular rhythm.  Pulmonary:     Effort: Pulmonary effort is normal.     Breath sounds: Normal breath sounds.  Skin:    General: Skin is warm and dry.  Neurological:     Mental Status: She is alert.  Psychiatric:        Mood and Affect: Mood normal.      Musculoskeletal Exam:  Shoulders full ROM no tenderness or swelling Right elbow tenderness with movement, to pressure on medial and lateral epicondyle, left lateral epicondyle tenderness, no palpable effusion Wrists full ROM no tenderness or swelling Right 2nd MCP tenderness to pressure, full finger ROM throughout Knees full ROM no tenderness or swelling Ankles full ROM no tenderness or swelling Right 1st MTP tenderness to pressure on dorsal side, pain with dorsiflexion  Limited MSKUS demonstrates hyperemia at right 2nd MCP and possibly slighy synovial thickening, without visible effusion   Investigation: No additional findings.  Imaging: No results found.  Recent Labs: Lab Results  Component Value Date   WBC 3.7 12/20/2023   HGB 12.7 12/20/2023   PLT 307 12/20/2023   NA 140 12/20/2023   K 4.0 12/20/2023   CL 101 12/20/2023   CO2 26 12/20/2023   GLUCOSE 88 12/20/2023   BUN 11 12/20/2023   CREATININE 0.72 12/20/2023    BILITOT 0.4 12/20/2023   ALKPHOS 77 12/20/2023   AST 27 12/20/2023   ALT 13 12/20/2023   PROT 7.3 12/20/2023   ALBUMIN 4.5 12/20/2023   CALCIUM 9.3 12/20/2023   GFRAA 111  03/25/2020    Speciality Comments: Estill Hemming O.D. eye exams  Procedures:  No procedures performed Allergies: Patient has no known allergies.   Assessment / Plan:     Visit Diagnoses: Seropositive rheumatoid arthritis (HCC) - Consider Methotrexate  if evidence of increased inflammation or worsening symptoms. - Plan: XR Hand 2 View Right, XR Hand 2 View Left, XR Elbow 2 Views Right Chronic RA with worsening symptoms in hands, especially right hand and index finger MCP joint. Increased pain and weakness in right hand, affecting daily tasks. Right thumb, wrist, and elbows also affected. Right elbow surgery anticipated. Imaging considered for structural changes or inflammation. Discussed methotrexate  as treatment option, concerns about side effects noted. - Order X-rays of hands and right elbow, elbow enthesophytes present but no significant hand abnormality -Checking sed rate for inflammatory disease activity monitoring - Continue hydroxychloroquine  200 mg daily - Start methotrexate  15 mg subcu weekly folic acid  1 mg daily  High risk medication use - Hydroxychloroquine  300mg  daily. Needs a PLQ eye exam. Discussed risks of methotrexate  including infections, hepatotoxicity, cytopenias, need for medication monitoring.  Discussed minimize alcohol use for 30 toxicity risk.  She prefers to go ahead with starting an injectable route to decrease potential for GI intolerance given history of previous bariatric surgery. - Checking CBC and CMP for medication monitoring  - Checking QuantiFERON for screening anticipating possible need to consider biologic DMARD if inadequate response or intolerance to methotrexate   Sleep disturbance Chronic pain Chronic pain in hands, feet, neck, and back, disrupting sleep. Gabapentin  provides limited  relief. Ultracet  used occasionally for severe pain. NSAID use restricted due to weight loss surgery. - Continue gabapentin  at night. - Use Ultracet  sparingly for severe pain. - Use acetaminophen  as needed. - Avoid NSAIDs.  Weight loss Weight stable between 128-130 pounds, at lower limit for hydroxychloroquine  dosage. Potential need for dosage adjustment if weight decreases.       Orders: Orders Placed This Encounter  Procedures   XR Hand 2 View Right   XR Hand 2 View Left   XR Elbow 2 Views Right   No orders of the defined types were placed in this encounter.    Follow-Up Instructions: No follow-ups on file.   Matt Song, MD  Note - This record has been created using [employer].  Chart creation errors have been sought, but may not always  have been located. Such creation errors do not reflect on  the standard of medical care.

## 2024-02-21 ENCOUNTER — Ambulatory Visit

## 2024-02-21 ENCOUNTER — Other Ambulatory Visit: Payer: Self-pay | Admitting: Family Medicine

## 2024-02-21 ENCOUNTER — Encounter: Payer: Self-pay | Admitting: Internal Medicine

## 2024-02-21 ENCOUNTER — Ambulatory Visit: Payer: Commercial Managed Care - PPO | Attending: Internal Medicine | Admitting: Internal Medicine

## 2024-02-21 ENCOUNTER — Other Ambulatory Visit (HOSPITAL_COMMUNITY): Payer: Self-pay

## 2024-02-21 VITALS — BP 121/75 | HR 73 | Resp 14 | Ht 61.0 in | Wt 133.0 lb

## 2024-02-21 DIAGNOSIS — M059 Rheumatoid arthritis with rheumatoid factor, unspecified: Secondary | ICD-10-CM

## 2024-02-21 DIAGNOSIS — M3501 Sicca syndrome with keratoconjunctivitis: Secondary | ICD-10-CM | POA: Diagnosis not present

## 2024-02-21 DIAGNOSIS — Z79899 Other long term (current) drug therapy: Secondary | ICD-10-CM | POA: Diagnosis not present

## 2024-02-21 DIAGNOSIS — G479 Sleep disorder, unspecified: Secondary | ICD-10-CM | POA: Diagnosis not present

## 2024-02-21 MED ORDER — TRAMADOL-ACETAMINOPHEN 37.5-325 MG PO TABS
1.0000 | ORAL_TABLET | Freq: Every evening | ORAL | 0 refills | Status: DC | PRN
  Filled 2024-02-21: qty 30, 30d supply, fill #0

## 2024-02-21 MED ORDER — METHOTREXATE SODIUM CHEMO INJECTION 50 MG/2ML
15.0000 mg | INTRAMUSCULAR | 0 refills | Status: DC
  Filled 2024-02-21: qty 4, 42d supply, fill #0

## 2024-02-21 MED ORDER — FOLIC ACID 1 MG PO TABS
1.0000 mg | ORAL_TABLET | Freq: Every day | ORAL | 0 refills | Status: DC
  Filled 2024-02-21: qty 90, 90d supply, fill #0

## 2024-02-21 NOTE — Patient Instructions (Signed)
 Methotrexate Injection What is this medication? METHOTREXATE (METH oh TREX ate) treats inflammatory conditions such as arthritis and psoriasis. It works by decreasing inflammation, which can reduce pain and prevent long-term injury to the joints and skin. It may also be used to treat some types of cancer. It works by slowing down the growth of cancer cells. This medicine may be used for other purposes; ask your health care provider or pharmacist if you have questions. What should I tell my care team before I take this medication? They need to know if you have any of these conditions: Fluid in the stomach area or lungs Frequently drink alcohol Infection or immune system problems Kidney disease Liver disease Low blood counts (white cells, platelets, or red blood cells) Lung disease Recent or ongoing radiation Recent or upcoming vaccine Stomach ulcers Ulcerative colitis An unusual or allergic reaction to methotrexate, other medications, foods, dyes, or preservatives Pregnant or trying to get pregnant Breastfeeding How should I use this medication? This medication is for infusion into a vein or for injection into muscle or into the spinal fluid (whichever applies). It is usually given in a hospital or clinic setting. In rare cases, you might get this medication at home. You will be taught how to give this medication. Use exactly as directed. Take your medication at regular intervals. Do not take your medication more often than directed. If this medication is used for arthritis or psoriasis, it should be taken weekly, NOT daily. It is important that you put your used needles and syringes in a special sharps container. Do not put them in a trash can. If you do not have a sharps container, call your pharmacist or care team to get one. Talk to your care team about the use of this medication in children. While this medication may be prescribed for children as young as 2 years for selected conditions,  precautions do apply. Overdosage: If you think you have taken too much of this medicine contact a poison control center or emergency room at once. NOTE: This medicine is only for you. Do not share this medicine with others. What if I miss a dose? It is important not to miss your dose. Call your care team if you are unable to keep an appointment. If you give yourself the medication, and you miss a dose, talk with your care team. Do not take double or extra doses. What may interact with this medication? Do not take this medication with any of the following: Acitretin Probenecid This medication may also interact with the following: Aspirin or aspirin-like medications Azathioprine Certain antibiotics, such as gentamicin, penicillin, tetracycline, vancomycin Certain medications that treat or prevent blood clots, such as warfarin, apixaban, dabigatran, rivaroxaban Certain medications for stomach problems, such as esomeprazole, omeprazole, pantoprazole Dapsone Hydroxychloroquine Live virus vaccines Medications for viral infections, such as acyclovir, cidofovir, foscarnet, ganciclovir Mercaptopurine NSAIDs, medications for pain and inflammation, such as ibuprofen or naproxen Phenytoin Pyrimethamine Retinoids, such as isotretinoin or tretinoin Sulfonamides, such as sulfasalazine or trimethoprim; sulfamethoxazole Theophylline This list may not describe all possible interactions. Give your health care provider a list of all the medicines, herbs, non-prescription drugs, or dietary supplements you use. Also tell them if you smoke, drink alcohol, or use illegal drugs. Some items may interact with your medicine. What should I watch for while using this medication? This medication may make you feel generally unwell. This is not uncommon as chemotherapy can affect healthy cells as well as cancer cells. Report any side effects.  Continue your course of treatment even though you feel ill unless your care  team tells you to stop. Your condition will be monitored carefully while you are receiving this medication. Avoid alcoholic drinks. This medication can cause serious side effects. To reduce the risk, your care team may give you other medications to take before receiving this one. Be sure to follow the directions from your care team. This medication can make you more sensitive to the sun. Keep out of the sun. If you cannot avoid being in the sun, wear protective clothing and use sunscreen. Do not use sun lamps or tanning beds/booths. You may get drowsy or dizzy. Do not drive, use machinery, or do anything that needs mental alertness until you know how this medication affects you. Do not stand or sit up quickly, especially if you are an older patient. This reduces the risk of dizzy or fainting spells. You may need blood work while you are taking this medication. Call your care team for advice if you get a fever, chills or sore throat, or other symptoms of a cold or flu. Do not treat yourself. This medication decreases your body's ability to fight infections. Try to avoid being around people who are sick. This medication may increase your risk to bruise or bleed. Call your care team if you notice any unusual bleeding. Be careful brushing or flossing your teeth or using a toothpick because you may get an infection or bleed more easily. If you have any dental work done, tell your dentist you are receiving this medication Check with your care team if you get an attack of severe diarrhea, nausea and vomiting, or if you sweat a lot. The loss of too much body fluid can make it dangerous for you to take this medication. Talk to your care team about your risk of cancer. You may be more at risk for certain types of cancers if you take this medication. Do not become pregnant while taking this medication or for 6 months after stopping it. Women should inform their care team if they wish to become pregnant or think  they might be pregnant. Men should not father a child while taking this medication and for 3 months after stopping it. There is potential for serious harm to an unborn child. Talk to your care team for more information. Do not breast-feed an infant while taking this medication or for 1 week after stopping it. This medication may make it more difficult to get pregnant or father a child. Talk to your care team if you are concerned about your fertility. What side effects may I notice from receiving this medication? Side effects that you should report to your care team as soon as possible: Allergic reactions--skin rash, itching, hives, swelling of the face, lips, tongue, or throat Blood clot--pain, swelling, or warmth in the leg, shortness of breath, chest pain Dry cough, shortness of breath or trouble breathing Infection--fever, chills, cough, sore throat, wounds that don't heal, pain or trouble when passing urine, general feeling of discomfort or being unwell Kidney injury--decrease in the amount of urine, swelling of the ankles, hands, or feet Liver injury--right upper belly pain, loss of appetite, nausea, light-colored stool, dark yellow or brown urine, yellowing of the skin or eyes, unusual weakness or fatigue Low red blood cell count--unusual weakness or fatigue, dizziness, headache, trouble breathing Redness, blistering, peeling, or loosening of the skin, including inside the mouth Seizures Unusual bruising or bleeding Side effects that usually do not require medical  attention (report to your care team if they continue or are bothersome): Diarrhea Dizziness Hair loss Nausea Pain, redness, or swelling with sores inside the mouth or throat Vomiting This list may not describe all possible side effects. Call your doctor for medical advice about side effects. You may report side effects to FDA at 1-800-FDA-1088. Where should I keep my medication? This medication is given in a hospital or clinic.  It will not be stored at home. NOTE: This sheet is a summary. It may not cover all possible information. If you have questions about this medicine, talk to your doctor, pharmacist, or health care provider.  2024 [employer] (2023-02-16 00:00:00)

## 2024-02-22 ENCOUNTER — Other Ambulatory Visit (HOSPITAL_COMMUNITY): Payer: Self-pay

## 2024-02-22 ENCOUNTER — Other Ambulatory Visit: Payer: Self-pay

## 2024-02-22 MED ORDER — FINACEA 15 % EX FOAM
1.0000 | Freq: Two times a day (BID) | CUTANEOUS | 0 refills | Status: DC
  Filled 2024-02-22: qty 50, 30d supply, fill #0

## 2024-02-22 MED ORDER — TIZANIDINE HCL 2 MG PO TABS
2.0000 mg | ORAL_TABLET | Freq: Three times a day (TID) | ORAL | 0 refills | Status: DC | PRN
  Filled 2024-02-22: qty 30, 5d supply, fill #0

## 2024-02-22 MED ORDER — LOSARTAN POTASSIUM-HCTZ 100-12.5 MG PO TABS
1.0000 | ORAL_TABLET | Freq: Every day | ORAL | 1 refills | Status: AC
  Filled 2024-02-22: qty 90, 90d supply, fill #0

## 2024-02-23 LAB — CBC WITH DIFFERENTIAL/PLATELET
Absolute Lymphocytes: 1625 {cells}/uL (ref 850–3900)
Absolute Monocytes: 455 {cells}/uL (ref 200–950)
Basophils Absolute: 9 {cells}/uL (ref 0–200)
Basophils Relative: 0.2 %
Eosinophils Absolute: 50 {cells}/uL (ref 15–500)
Eosinophils Relative: 1.1 %
HCT: 38.6 % (ref 35.0–45.0)
Hemoglobin: 12.3 g/dL (ref 11.7–15.5)
MCH: 26.9 pg — ABNORMAL LOW (ref 27.0–33.0)
MCHC: 31.9 g/dL — ABNORMAL LOW (ref 32.0–36.0)
MCV: 84.5 fL (ref 80.0–100.0)
MPV: 9.5 fL (ref 7.5–12.5)
Monocytes Relative: 10.1 %
Neutro Abs: 2363 {cells}/uL (ref 1500–7800)
Neutrophils Relative %: 52.5 %
Platelets: 304 10*3/uL (ref 140–400)
RBC: 4.57 10*6/uL (ref 3.80–5.10)
RDW: 13.5 % (ref 11.0–15.0)
Total Lymphocyte: 36.1 %
WBC: 4.5 10*3/uL (ref 3.8–10.8)

## 2024-02-23 LAB — QUANTIFERON-TB GOLD PLUS
Mitogen-NIL: 8.15 [IU]/mL
NIL: 0.04 [IU]/mL
QuantiFERON-TB Gold Plus: NEGATIVE
TB1-NIL: 0.04 [IU]/mL
TB2-NIL: 0 [IU]/mL

## 2024-02-23 LAB — COMPREHENSIVE METABOLIC PANEL WITH GFR
AG Ratio: 1.7 (calc) (ref 1.0–2.5)
ALT: 14 U/L (ref 6–29)
AST: 19 U/L (ref 10–35)
Albumin: 4.3 g/dL (ref 3.6–5.1)
Alkaline phosphatase (APISO): 63 U/L (ref 37–153)
BUN: 8 mg/dL (ref 7–25)
CO2: 29 mmol/L (ref 20–32)
Calcium: 8.9 mg/dL (ref 8.6–10.4)
Chloride: 106 mmol/L (ref 98–110)
Creat: 0.65 mg/dL (ref 0.50–1.03)
Globulin: 2.5 g/dL (ref 1.9–3.7)
Glucose, Bld: 40 mg/dL — ABNORMAL LOW (ref 65–99)
Potassium: 4 mmol/L (ref 3.5–5.3)
Sodium: 142 mmol/L (ref 135–146)
Total Bilirubin: 0.3 mg/dL (ref 0.2–1.2)
Total Protein: 6.8 g/dL (ref 6.1–8.1)
eGFR: 103 mL/min/{1.73_m2} (ref >=60)

## 2024-02-23 LAB — SEDIMENTATION RATE: Sed Rate: 6 mm/h (ref 0–30)

## 2024-02-25 MED ORDER — TUBERCULIN SYRINGE 27G X 1/2" 1 ML MISC
0 refills | Status: DC
  Filled 2024-02-25: qty 12, 84d supply, fill #0
  Filled 2024-03-20: qty 25, 175d supply, fill #0

## 2024-02-27 ENCOUNTER — Other Ambulatory Visit (HOSPITAL_COMMUNITY): Payer: Self-pay

## 2024-03-20 ENCOUNTER — Other Ambulatory Visit: Payer: Self-pay | Admitting: Family Medicine

## 2024-03-20 ENCOUNTER — Other Ambulatory Visit (HOSPITAL_COMMUNITY): Payer: Self-pay

## 2024-03-20 ENCOUNTER — Other Ambulatory Visit: Payer: Self-pay

## 2024-03-20 MED ORDER — OZEMPIC (1 MG/DOSE) 4 MG/3ML ~~LOC~~ SOPN
1.0000 mg | PEN_INJECTOR | SUBCUTANEOUS | 1 refills | Status: DC
  Filled 2024-03-20: qty 3, 28d supply, fill #0
  Filled 2024-04-19: qty 3, 28d supply, fill #1

## 2024-03-26 ENCOUNTER — Encounter: Payer: Self-pay | Admitting: Internal Medicine

## 2024-03-27 ENCOUNTER — Other Ambulatory Visit (HOSPITAL_COMMUNITY): Payer: Self-pay

## 2024-04-08 ENCOUNTER — Other Ambulatory Visit: Payer: Self-pay | Admitting: Family Medicine

## 2024-04-09 ENCOUNTER — Other Ambulatory Visit (HOSPITAL_COMMUNITY): Payer: Self-pay

## 2024-04-09 MED ORDER — FINACEA 15 % EX FOAM
1.0000 | Freq: Two times a day (BID) | CUTANEOUS | 0 refills | Status: DC
  Filled 2024-04-09: qty 50, 30d supply, fill #0

## 2024-04-10 ENCOUNTER — Other Ambulatory Visit (HOSPITAL_COMMUNITY): Payer: Self-pay

## 2024-04-10 NOTE — Progress Notes (Signed)
 Office Visit Note  Patient: Kristine Stevens             Date of Birth: 03-25-1966           MRN: [id number]             PCP: Alvia Bring, DO Referring: Alvia Bring, DO Visit Date: 04/11/2024   Subjective:  Follow-up (Discuss medications )    Discussed the use of AI scribe software for clinical note transcription with the patient, who gave verbal consent to proceed.  History of Present Illness   Kristine Stevens is a 58 year old female with rheumatoid arthritis on hydroxychloroquine  300 mg daily who presents with worsening joint pain and medication side effects after starting methotrexate .  She experiences worsening joint pain, particularly in her hands, elbows, and spine, which significantly affects her ability to work, especially during night shifts when her elbows ache considerably.  She has been on methotrexate  but experiences increased fatigue and malaise with each dose. The medication has not improved her symptoms despite several doses, and she has not taken her injection since last Thursday due to running out of medication and uncertainty about continuing it. She reports low energy levels and a diminished quality of life, stating that she has to plan her schedule around feeling unwell after taking methotrexate .  She mentions a low fasting glucose level of 40, with some symptoms of more low energy no specific sweats, tremors, or neurologic effects. She frequently experiences hypoglycemia, with her glucose monitor often alerting her to very low blood sugar levels. She has been on a stable dose of 1 mg of Ozempic  for over six months.  She reports pain in her right wrist most noticeable right now.   Previous HPI 02/21/24 Kristine Stevens is a 58 y.o. female here for follow up with Sjogren's syndrome seropositive RA characterized by intermittent fever and previous CNS inflammation on hydroxychloroquine  300 mg daily.     She experiences increased pain and weakness in her right hand,  particularly in the MCP joint of her index finger, which has worsened to the point where she cannot unscrew bottles or jars. The pain is more severe in the right hand compared to the left, with additional discomfort in the thumb and wrist. She also has pain in her elbows, more on the right than the left.   She recently underwent surgery on her left elbow, which is healing well. She received a cortisone injection in the right elbow, which provided minimal relief.   She experiences pain in her spine, which is most disruptive at night, affecting her sleep. She often sleeps in a recliner to alleviate discomfort, which in turn causes neck pain. She takes gabapentin  at night but reports it no longer provides significant relief. She occasionally uses Ultracet  for severe pain, especially after physical activity [place].   She has a history of weight loss surgery in 2016, which limits her use of NSAIDs. She takes Tylenol  as needed for pain relief. Her weight is currently between 128 and 130 pounds, which is at the lower limit for her current dose of hydroxychloroquine .   No recent significant illnesses or infections. No recent respiratory or gastrointestinal infections. Reports pain in hands, feet, neck, and knees, with nighttime pain affecting sleep.         Previous HPI 08/23/2023 Kristine Stevens is a 58 y.o. female here for follow up with Sjogren's syndrome seropositive RA characterized by intermittent fever and previous CNS inflammation  on hydroxychloroquine  300 mg daily.  She presents with increased joint pain in the hands and elbows. They report that the pain has been severe enough to disrupt sleep and is more noticeable during periods of rest. Both elbows are affected, with the right being more painful than the left. The patient describes the elbows as stiff and painful to rest on surfaces. Upon waking, the patient experiences stiffness in both hands, particularly in the right hand, which requires  assistance from the left hand to open and close. The stiffness tends to improve as the patient progresses through the day, but returns if the patient remains still for extended periods.    The patient also reports decreased hand strength, making tasks such as opening soda bottles or jars difficult. However, they deny any associated numbness or increased incidence of dropping items. No visible joint swelling has been observed.   The patient is currently on hydroxychloroquine  300mg  and takes gabapentin  600mg  at night, as well as trazodone  25mg  to aid sleep. Gabapentin  helps but does not take at night if working the next day due to residual drowsiness. Trazodone  helps but has side effects if taking high dose.   The patient also reports a period of approximately one week to ten days about a month ago when they felt like they were in a 'subacute flare', experiencing increased pain, fatigue, and sleep disturbances. They chose to endure this period without seeking medical attention.   The patient is also on Vascepa , a prescription-strength fish oil supplement, and takes a calcium supplement. They have previously used topical diclofenac  for Achilles tendonitis and still have some available for use.      Previous HPI 02/08/2023 Kristine Stevens is a 58 y.o. female here for follow-up with Sjogren's syndrome seropositive RA characterized by intermittent fever and previous CNS inflammation on hydroxychloroquine  300 mg daily.  She has been doing worse for the past 3 to 4 weeks particularly with pain in her neck and upper back.  This pain is most bothersome at nighttime causing difficulty sleeping and some sleep fragmentation.  She was prescribed tizanidine  and Ultracet  for this but with only a limited response to the treatment.  In addition to the gabapentin  600 mg at night which she has been on previously.  Did not recall any specific injury or activity change to account for increased symptoms.  Does not have  widespread increase of symptoms and no recurrence of fever or neurologic symptoms.  She does feel her fatigue has been worse but not sure if this is related to the sleep disruption.   Labs reviewed 11/09/22 Kristine Stevens is a 58 y.o. female here for follow up for Sjogren syndrome and seropositive RA with intermittent fever and previous CNS inflammation after starting hydroxychloroquine  200 mg daily.  She had another flareup between Christmas and New Year's pretty debilitating spent the majority of 4 days in bed.  She only had low fevers during this time but also severe fatigue.  She is having fairly diffuse pain at nighttime disrupting her sleep more than half of the nights.  Gabapentin  and low-dose tramadol  have been helpful for sleeping through the night.  May be see some benefit with muscle relaxer medication but avoid this due to getting a drowsiness or hangover effect into the following day.  Not seeing any obvious peripheral joint swelling.  So far she is not sure whether hydroxychloroquine  is making much difference although the febrile episode was much less severe compared to the that landed her  in the hospital last year.   08/25/22 Kristine Stevens is a 58 y.o. female here for follow up for sjogren syndrome and suspected seropositive RA. Lab evaluation at initial visit showing increased CCP Ab titer but normal inflammatory markers. She continues to experience bilateral hand stiffness and swelling most severe in the morning. Also having pain at the left great toe and schedule to see orthopedic surgery clinic for this. She still feels fatigued similar as before. She had recent ophthalmology follow up and left eye amniotic membrane procedure for dry eye.   Kristine Stevens is a 58 y.o. female here for evaluation of fevers and pain associated with positive CCP Abs and history of sjogren syndrome. She was recently hospitalized with SIRS and suspected infection but no organism identified also concerned for  underlying autoimmune process. She completed a course of doxycycline  for possible tickborne illness. CMV testing was positive for IgG and IgM. Ferritin low at 11 with findings consistent for IDA.    Symptoms started with severe low back pain then progressed to whole spine and headache and fevers. After one week hospitalization symptoms improved and has returned mostly to baseline. Still has pain in the low back more frequently than before. Notices left hip pain pretty regularly and popping or cracking sensation in her thoracic spine. Stiffness and pain commonly in both feet this lasts for about 1 hour also gets elbows and hands for 30-60 minutes. She takes ibuprofen rarely due to previous bariatric surgery. Takes gabapentin  600 mg at night when she is not working which helps partially.   She has a history of gastric sleeve surgery and has some chronic diarrhea but this was more related to sphincter of Oddi dysfunction before this too. She had 3 left knee surgeries she had meniscal injury there as a teenager. She has a family history of RA in her mother who had deforming disease involving both hands.   Labs reviewed 03/2022 ANA neg RF neg CCP 81 Ferritin 11 Lyme neg Erlichiosis IgM equivocal CMV IgG pos IgM pos   Review of Systems  Constitutional:  Positive for fatigue.  HENT:  Positive for mouth dryness. Negative for mouth sores.   Eyes:  Positive for dryness.  Respiratory:  Negative for shortness of breath.   Cardiovascular:  Negative for chest pain and palpitations.  Gastrointestinal:  Negative for blood in stool, constipation and diarrhea.  Endocrine: Negative for increased urination.  Genitourinary:  Negative for involuntary urination.  Musculoskeletal:  Positive for joint pain, joint pain, joint swelling, muscle weakness and morning stiffness. Negative for gait problem, myalgias, muscle tenderness and myalgias.  Skin:  Negative for color change, rash, hair loss and sensitivity to  sunlight.  Allergic/Immunologic: Negative for susceptible to infections.  Neurological:  Negative for dizziness and headaches.  Hematological:  Negative for swollen glands.  Psychiatric/Behavioral:  Positive for sleep disturbance. Negative for depressed mood. The patient is not nervous/anxious.     PMFS History:  Patient Active Problem List   Diagnosis Date Noted   High risk medication use 04/11/2024   Well adult exam 12/22/2023   Upper back pain 02/08/2023   At increased risk for cardiovascular disease 12/21/2022   Seropositive rheumatoid arthritis (HCC) 07/22/2022   Joint pain 06/02/2022   FUO (fever of unknown origin) 04/19/2022   Sjogren's syndrome (HCC) 04/03/2022   Rosacea 02/04/2022   Encounter for counseling 02/04/2022   Essential hypertension 09/27/2021   Insomnia secondary to depression with anxiety 09/27/2021   Tick bite 01/25/2021  Statin intolerance 04/12/2019   Sicca syndrome (HCC) 02/05/2019   Elevated transaminase measurement 11/02/2017   History of high cholesterol 05/27/2016   History of gestational diabetes 05/27/2016   History of acute pancreatitis 05/27/2016   Prediabetes 04/17/2015   Status post laparoscopic sleeve gastrectomy June 2016 03/25/2015   Hyperlipemia 01/02/2015   Dysfunction of sphincter of Oddi 08/16/2013    Past Medical History:  Diagnosis Date   GERD (gastroesophageal reflux disease)    Heartburn    Hyperlipidemia    Hypertension    IBS (irritable bowel syndrome)    PONV (postoperative nausea and vomiting)    severe   Pre-diabetes    Prediabetes    RA (rheumatoid arthritis) (HCC)    Rosacea    Sjogren's syndrome (HCC)    Sphincter of Oddi dysfunction     Family History  Problem Relation Age of Onset   Hypertension Mother    Hyperlipidemia Mother    Fibromyalgia Mother    Parkinson's disease Mother    Rheum arthritis Mother    Hyperlipidemia Father    Hypertension Father    Parkinson's disease Brother    Mental  retardation Brother    Hyperlipidemia Brother    Breast cancer Maternal Grandmother 65   Healthy Daughter    Breast cancer Cousin 45   Colon cancer Neg Hx    Esophageal cancer Neg Hx    Rectal cancer Neg Hx    Stomach cancer Neg Hx    Past Surgical History:  Procedure Laterality Date   ABDOMINOPLASTY  05/25/2012   ARTHRODESIS METATARSALPHALANGEAL JOINT (MTPJ) Left 08/11/2023   Procedure: LEFT HALLUX ARTHRODESIS METATARSALPHALANGEAL JOINT (MTPJ);  Surgeon: Kit Rush, MD;  Location: [facility];  Service: Orthopedics;  Laterality: Left;   BUNIONECTOMY Left 08/11/2023   Procedure: FELICIE EDWARDS;  Surgeon: Kit Rush, MD;  Location: [facility];  Service: Orthopedics;  Laterality: Left;   CESAREAN SECTION  1991, 1998   x2   CHOLECYSTECTOMY  1995   IRRIGATION AND DEBRIDEMENT ABSCESS Right 05/22/2013   Procedure: MINOR INCISION AND DRAINAGE OF ABSCESS;  Surgeon: Arley JONELLE Curia, MD;  Location: [facility];  Service: Orthopedics;  Laterality: Right;   KNEE ARTHROSCOPY Left    x3   LAPAROSCOPIC GASTRIC SLEEVE RESECTION N/A 03/25/2015   Procedure: LAPAROSCOPIC GASTRIC SLEEVE RESECTION UPPER ENDOSCOPY;  Surgeon: Donnice Lunger, MD;  Location: [facility];  Service: General;  Laterality: N/A;   VAGINAL BIRTH AFTER CESAREAN SECTION  1993, 2001   x2   Social History   Social History Narrative   Not on file   Immunization History  Administered Date(s) Administered   Influenza,inj,Quad PF,6+ Mos 05/27/2016, 06/27/2017, 06/04/2020, 06/02/2022   Influenza-Unspecified 07/02/2013, 07/26/2014, 06/28/2019, 06/21/2023   Janssen (J&J) SARS-COV-2 Vaccination 01/02/2020   Tdap 03/21/2017   Zoster Recombinant(Shingrix) 06/04/2020, 09/16/2020     Objective: Vital Signs: BP 119/77 (BP Location: Left Arm, Patient Position: Sitting, Cuff Size: Normal)   Pulse 73   Resp 16   Ht 5' 1 (1.549 m)   Wt 133 lb (60.3 kg)   BMI 25.13 kg/m    Physical  Exam Eyes:     Conjunctiva/sclera: Conjunctivae normal.  Cardiovascular:     Rate and Rhythm: Normal rate and regular rhythm.  Pulmonary:     Effort: Pulmonary effort is normal.     Breath sounds: Normal breath sounds.  Lymphadenopathy:     Cervical: No cervical adenopathy.  Skin:    General: Skin is warm and  dry.     Findings: No rash.  Neurological:     Mental Status: She is alert.  Psychiatric:        Mood and Affect: Mood normal.      Musculoskeletal Exam:  Shoulders full ROM no tenderness or swelling Elbows full ROM, no swelling, some tenderness medial and lateral epicondyle Right wrist range of motion is slightly restricted, some tenderness along joint line but no palpable effusion Right 2nd MCP tenderness to pressure, full finger ROM throughout Knees full ROM no tenderness or swelling Ankles full ROM no tenderness or swelling Right 1st MTP tenderness to pressure   Investigation: No additional findings.  Imaging: No results found.  Recent Labs: Lab Results  Component Value Date   WBC 4.5 02/21/2024   HGB 12.3 02/21/2024   PLT 304 02/21/2024   NA 142 02/21/2024   K 4.0 02/21/2024   CL 106 02/21/2024   CO2 29 02/21/2024   GLUCOSE 40 (L) 02/21/2024   BUN 8 02/21/2024   CREATININE 0.65 02/21/2024   BILITOT 0.3 02/21/2024   ALKPHOS 77 12/20/2023   AST 19 02/21/2024   ALT 14 02/21/2024   PROT 6.8 02/21/2024   ALBUMIN 4.5 12/20/2023   CALCIUM 8.9 02/21/2024   GFRAA 111 03/25/2020   QFTBGOLDPLUS NEGATIVE 02/21/2024    Speciality Comments: Amy Harper O.D. eye exams  Procedures:  No procedures performed Allergies: Patient has no known allergies.   Assessment / Plan:     Visit Diagnoses: Seropositive rheumatoid arthritis (HCC) Chronic arthritis in the right wrist Chronic arthritis with joint pain and stiffness in multiple areas.  Most affected with more apparent inflammation and restricted motion and pain in the right wrist at second dorsal compartment,  affecting daily activities.  Previously with MCP joint synovitis confirmed on ultrasound last visit in May.  We had baseline labs from that visit, appropriate for new medication start would be more useful to wait and repeat 1 month after starting. - Continue hydroxychloroquine  300 mg daily for now - Plan to start on Enbrel 50 mg subcu weekly - Explained risks of Enbrel: increased infection risk, cytopenias, hepatotoxicity, potential skin cancer or lymphoma risk with long-term use - Monitor blood count, liver enzymes, and perform annual tuberculosis screening. - Consider dermatology referral for annual skin checks if on long-term Enbrel.  Adverse reaction to methotrexate  Adverse reaction with increased joint pain and fatigue with pretty traumatic amount of side effects consistently over 1 month.  Agree with discontinuing since I think she is unlikely to tolerate this adequately as a long-term option or certainly would not be able to titrate dose.  Hypoglycemia Symptomatic hypoglycemia with glucose levels in the 50s on her monitor and was 40 at labs on last visit here. I suspect may be exacerbated by Ozempic  and Plaquenil  in combination which is pretty uncommon but have seen on hypoglycemic medications before. - We should be able to try discontinuing Plaquenil  if switching to Enbrel to assess impact on blood sugar levels as combination therapy is usually not required for these   Orders: No orders of the defined types were placed in this encounter.  No orders of the defined types were placed in this encounter.    Follow-Up Instructions: No follow-ups on file.   Lonni LELON Ester, MD  Note - This record has been created using [employer].  Chart creation errors have been sought, but may not always  have been located. Such creation errors do not reflect on  the standard of medical  care.

## 2024-04-11 ENCOUNTER — Telehealth: Payer: Self-pay

## 2024-04-11 ENCOUNTER — Ambulatory Visit: Attending: Internal Medicine | Admitting: Internal Medicine

## 2024-04-11 ENCOUNTER — Encounter: Payer: Self-pay | Admitting: Internal Medicine

## 2024-04-11 VITALS — BP 119/77 | HR 73 | Resp 16 | Ht 61.0 in | Wt 133.0 lb

## 2024-04-11 DIAGNOSIS — M059 Rheumatoid arthritis with rheumatoid factor, unspecified: Secondary | ICD-10-CM

## 2024-04-11 DIAGNOSIS — Z79899 Other long term (current) drug therapy: Secondary | ICD-10-CM

## 2024-04-11 DIAGNOSIS — M3501 Sicca syndrome with keratoconjunctivitis: Secondary | ICD-10-CM

## 2024-04-11 NOTE — Progress Notes (Signed)
 Pharmacy Note  Subjective: Patient presents today to the [facility] for follow up office visit.  Patient seen by the pharmacist for counseling on Enbrel for rheumatoid arthritis.  Diagnosis of heart failure: No  Objective:  CBC    Component Value Date/Time   WBC 4.5 02/21/2024 1228   RBC 4.57 02/21/2024 1228   HGB 12.3 02/21/2024 1228   HGB 12.7 12/20/2023 1119   HCT 38.6 02/21/2024 1228   HCT 38.1 12/20/2023 1119   PLT 304 02/21/2024 1228   PLT 307 12/20/2023 1119   MCV 84.5 02/21/2024 1228   MCV 84 12/20/2023 1119   MCH 26.9 (L) 02/21/2024 1228   MCHC 31.9 (L) 02/21/2024 1228   RDW 13.5 02/21/2024 1228   RDW 13.4 12/20/2023 1119   LYMPHSABS 1.8 12/20/2023 1119   MONOABS 0.6 03/04/2020 1619   EOSABS 50 02/21/2024 1228   EOSABS 0.1 12/20/2023 1119   BASOSABS 9 02/21/2024 1228   BASOSABS 0.0 12/20/2023 1119     CMP     Component Value Date/Time   NA 142 02/21/2024 1228   NA 140 12/20/2023 1119   K 4.0 02/21/2024 1228   CL 106 02/21/2024 1228   CO2 29 02/21/2024 1228   GLUCOSE 40 (L) 02/21/2024 1228   BUN 8 02/21/2024 1228   BUN 11 12/20/2023 1119   CREATININE 0.65 02/21/2024 1228   CALCIUM 8.9 02/21/2024 1228   PROT 6.8 02/21/2024 1228   PROT 7.3 12/20/2023 1119   PROT 7.1 09/28/2021 0945   ALBUMIN 4.5 12/20/2023 1119   AST 19 02/21/2024 1228   ALT 14 02/21/2024 1228   ALKPHOS 77 12/20/2023 1119   BILITOT 0.3 02/21/2024 1228   BILITOT 0.4 12/20/2023 1119   GFRNONAA >60 07/29/2023 0930   GFRNONAA 96 03/25/2020 1002   GFRAA 111 03/25/2020 1002     Baseline Immunosuppressant Therapy Labs TB GOLD    Latest Ref Rng & Units 02/21/2024   12:28 PM  Quantiferon TB Gold  Quantiferon TB Gold Plus NEGATIVE NEGATIVE    Hepatitis Panel   HIV No results found for: HIV Immunoglobulins    Latest Ref Rng & Units 07/22/2022   10:25 AM  Immunoglobulin Electrophoresis  IgA  47 - 310 mg/dL 821   IgG 399 - 8,359 mg/dL 8,634   IgM 50 - 699 mg/dL  44    SPEP    Latest Ref Rng & Units 02/21/2024   12:28 PM  Serum Protein Electrophoresis  Total Protein 6.1 - 8.1 g/dL 6.8    H3EI No results found for: G6PDH TPMT No results found for: TPMT   Chest x-ray (2017): No active cardiopulmonary disease.   Assessment/Plan:  Counseled patient that Enbrel is a TNF blocking agent.  Counseled patient on purpose, proper use, and adverse effects of Enbrel.  Reviewed the most common adverse effects including infections, headache, and injection site reactions.  Discussed that there is the possibility of an increased risk of malignancy including non-melanoma skin cancer but it is not well understood if this increased risk is due to the medication or the disease state.  Advised patient to get yearly dermatology exams due to risk of skin cancer.  Counseled patient that Enbrel should be held prior to scheduled surgery.  Counseled patient to avoid live vaccines while on Enbrel.  Recommend annual influenza, PCV 15 or PCV20 or Pneumovax 23, and Shingrix as indicated.  Reviewed the importance of regular labs while on Enbrel therapy.  Will monitor CBC and CMP 1 month  after starting and then every 3 months routinely thereafter. Will monitor TB gold annually. Standing orders placed. Provided patient with medication education material and answered all questions.  Patient consented to Enbrel.  Will upload consent into the media tab.  Reviewed storage instructions for Enbrel.  Advised initial injection must be administered in office.    Patient will start Enbrel at home once approved. Patient was counseled on proper injection techniques and storage of Enbrel.

## 2024-04-11 NOTE — Telephone Encounter (Signed)
 Kristine Stevens, RPH-CPP  P Rx Rheum/Pulm Pending OV note from today, patient will be new start to Enbrel SURECLICK Failed MTX (GI side effects) and hypoglycemia with hydroxychloroquine  Patient will start Enbrel at home   Awaiting completion of chart note.

## 2024-04-19 ENCOUNTER — Other Ambulatory Visit (HOSPITAL_COMMUNITY): Payer: Self-pay

## 2024-04-19 ENCOUNTER — Other Ambulatory Visit: Payer: Self-pay

## 2024-04-19 ENCOUNTER — Other Ambulatory Visit: Payer: Self-pay | Admitting: Family Medicine

## 2024-04-19 MED ORDER — FREESTYLE LIBRE 3 SENSOR MISC
1.0000 | 2 refills | Status: DC
  Filled 2024-04-19: qty 2, 28d supply, fill #0
  Filled 2024-07-02: qty 2, 28d supply, fill #1
  Filled 2024-08-02: qty 2, 28d supply, fill #2
  Filled 2024-08-02 – 2024-08-03 (×2): qty 2, 28d supply, fill #0

## 2024-04-19 NOTE — Telephone Encounter (Signed)
 Submitted a Prior Authorization request to [employer] for ENBREL via CoverMyMeds. Will update once we receive a response.  Key: [id number]

## 2024-04-24 ENCOUNTER — Other Ambulatory Visit (HOSPITAL_COMMUNITY): Payer: Self-pay

## 2024-04-24 MED ORDER — ENBREL SURECLICK 50 MG/ML ~~LOC~~ SOAJ
50.0000 mg | SUBCUTANEOUS | 0 refills | Status: DC
  Filled 2024-04-26: qty 4, 28d supply, fill #0
  Filled 2024-05-14: qty 4, 28d supply, fill #1
  Filled 2024-06-15: qty 4, 28d supply, fill #2

## 2024-04-24 NOTE — Telephone Encounter (Addendum)
 Received notification from [facility] regarding a prior authorization for ENBREL . Authorization has been APPROVED from 04/23/2024 to 10/20/2024. Approval letter sent to scan center.  Per test claim, copay for 28 days supply is $500  Patient must fill through [facility]: [phone]   Authorization # [id number]  Enrolled patient into Enbrel  copay card: BIN: [id number] PCN: [id number] Group: [id number] ID: [id number]  Rx sent to [facility] for onboarding. MyChart message sent to patient  Sherry Pennant, PharmD, MPH, BCPS, CPP Clinical Pharmacist (Rheumatology and Pulmonology)

## 2024-04-25 ENCOUNTER — Other Ambulatory Visit: Payer: Self-pay

## 2024-04-25 ENCOUNTER — Other Ambulatory Visit (HOSPITAL_COMMUNITY): Payer: Self-pay

## 2024-04-26 ENCOUNTER — Other Ambulatory Visit: Payer: Self-pay

## 2024-04-26 ENCOUNTER — Other Ambulatory Visit (HOSPITAL_COMMUNITY): Payer: Self-pay

## 2024-04-26 NOTE — Progress Notes (Signed)
 Specialty Pharmacy Initial Fill Coordination Note  Kristine Stevens is a 58 y.o. female contacted today regarding initial fill of specialty medication(s) Etanercept  (Enbrel  SureClick)   Patient requested Delivery   Delivery date: 05/01/24   Verified address: [address]   [place] [address]   Medication will be filled on 04/27/24.   Patient is enrolled into copay card program and is aware of $0 copayment.

## 2024-04-27 NOTE — Progress Notes (Signed)
 Patient starting Enbrel  for RA.  has been on methotrexate  but experiences increased fatigue and malaise with each dose. The medication has not improved her symptoms despite several doses   Start Enbrel  50mg  subcut every 7 days as monotherapy  Kristine Stevens, PharmD, MPH, BCPS, CPP Clinical Pharmacist (Rheumatology and Pulmonology)

## 2024-04-30 ENCOUNTER — Other Ambulatory Visit: Payer: Self-pay

## 2024-04-30 ENCOUNTER — Other Ambulatory Visit (HOSPITAL_COMMUNITY): Payer: Self-pay

## 2024-04-30 ENCOUNTER — Other Ambulatory Visit: Payer: Self-pay | Admitting: Internal Medicine

## 2024-04-30 ENCOUNTER — Other Ambulatory Visit: Payer: Self-pay | Admitting: Family Medicine

## 2024-04-30 DIAGNOSIS — M059 Rheumatoid arthritis with rheumatoid factor, unspecified: Secondary | ICD-10-CM

## 2024-04-30 DIAGNOSIS — G479 Sleep disorder, unspecified: Secondary | ICD-10-CM

## 2024-04-30 MED ORDER — TIZANIDINE HCL 2 MG PO TABS
2.0000 mg | ORAL_TABLET | Freq: Three times a day (TID) | ORAL | 0 refills | Status: DC | PRN
  Filled 2024-04-30: qty 30, 5d supply, fill #0

## 2024-04-30 MED ORDER — TRAZODONE HCL 50 MG PO TABS
50.0000 mg | ORAL_TABLET | Freq: Every evening | ORAL | 3 refills | Status: AC | PRN
  Filled 2024-04-30: qty 90, 45d supply, fill #0

## 2024-04-30 MED ORDER — TRAMADOL-ACETAMINOPHEN 37.5-325 MG PO TABS
1.0000 | ORAL_TABLET | Freq: Every evening | ORAL | 0 refills | Status: DC | PRN
  Filled 2024-04-30: qty 30, 30d supply, fill #0

## 2024-04-30 NOTE — Telephone Encounter (Signed)
 Last Fill: 02/21/2024  UDS:not on file  Narc Agreement: not on file   Next Visit: 05/24/2024  Last Visit: 04/11/2024  Dx: not mentioned  Current Dose per office note on 04/11/2024: not mentioned  If patient remains on the medication, patient will need a UDS and patient to fill out Narc agreement at follow up.    Okay to refill Tramadol -Acetaminophen ?

## 2024-05-10 NOTE — Progress Notes (Signed)
 Office Visit Note  Patient: JIM PHILEMON             Date of Birth: Jun 26, 1966           MRN: [id number]             PCP: Alvia Bring, DO Referring: Alvia Bring, DO Visit Date: 05/24/2024   Subjective:  Follow-up (Possible reaction with the enbrel  )   Discussed the use of AI scribe software for clinical note transcription with the patient, who gave verbal consent to proceed.  History of Present Illness   PERSAIS ETHRIDGE is a 58 y.o. female here for follow up with rheumatoid arthritis recently switched to Enbrel  50 mg [place] weekly.  She has experienced injection site reactions, including swelling and itching, which typically begin a day or so after administration. The reactions have varied in size, with the most recent being the smallest on her lower abdomen. She has completed four doses so far.  Her rheumatoid arthritis pain is constant and affects her quality of life, particularly in her hands and elbows. The pain is not limited to the morning and can occur after periods of inactivity. She has difficulty holding her grandchildren due to the pain. She takes Ultracet  once a day, usually at night, but finds it ineffective. She has stopped taking gabapentin  as it was no longer effective.  She discontinued hydroxychloroquine  due to its effect on her blood sugar levels, which have since stabilized. She has not noticed any visible changes in swelling or redness since stopping the medication.  She had a cold a few weeks ago but recovered quickly.. She experiences pain in her fourth finger and has difficulty closing her index finger at times.      Previous HPI 04/11/2024 TEONIA YAGER is a 58 year old female with rheumatoid arthritis on hydroxychloroquine  300 mg daily who presents with worsening joint pain and medication side effects after starting methotrexate .   She experiences worsening joint pain, particularly in her hands, elbows, and spine, which significantly affects her ability to  work, especially during night shifts when her elbows ache considerably.   She has been on methotrexate  but experiences increased fatigue and malaise with each dose. The medication has not improved her symptoms despite several doses, and she has not taken her injection since last Thursday due to running out of medication and uncertainty about continuing it. She reports low energy levels and a diminished quality of life, stating that she has to plan her schedule around feeling unwell after taking methotrexate .   She mentions a low fasting glucose level of 40, with some symptoms of more low energy no specific sweats, tremors, or neurologic effects. She frequently experiences hypoglycemia, with her glucose monitor often alerting her to very low blood sugar levels. She has been on a stable dose of 1 mg of Ozempic  for over six months.   She reports pain in her right wrist most noticeable right now.     Previous HPI 02/21/24 ANNAJULIA LEWING is a 59 y.o. female here for follow up with Sjogren's syndrome seropositive RA characterized by intermittent fever and previous CNS inflammation on hydroxychloroquine  300 mg daily.     She experiences increased pain and weakness in her right hand, particularly in the MCP joint of her index finger, which has worsened to the point where she cannot unscrew bottles or jars. The pain is more severe in the right hand compared to the left, with additional discomfort in the thumb and  wrist. She also has pain in her elbows, more on the right than the left.   She recently underwent surgery on her left elbow, which is healing well. She received a cortisone injection in the right elbow, which provided minimal relief.   She experiences pain in her spine, which is most disruptive at night, affecting her sleep. She often sleeps in a recliner to alleviate discomfort, which in turn causes neck pain. She takes gabapentin  at night but reports it no longer provides significant relief. She  occasionally uses Ultracet  for severe pain, especially after physical activity [place].   She has a history of weight loss surgery in 2016, which limits her use of NSAIDs. She takes Tylenol  as needed for pain relief. Her weight is currently between 128 and 130 pounds, which is at the lower limit for her current dose of hydroxychloroquine .   No recent significant illnesses or infections. No recent respiratory or gastrointestinal infections. Reports pain in hands, feet, neck, and knees, with nighttime pain affecting sleep.         Previous HPI 08/23/2023 AMARYAH MALLEN is a 58 y.o. female here for follow up with Sjogren's syndrome seropositive RA characterized by intermittent fever and previous CNS inflammation on hydroxychloroquine  300 mg daily.  She presents with increased joint pain in the hands and elbows. They report that the pain has been severe enough to disrupt sleep and is more noticeable during periods of rest. Both elbows are affected, with the right being more painful than the left. The patient describes the elbows as stiff and painful to rest on surfaces. Upon waking, the patient experiences stiffness in both hands, particularly in the right hand, which requires assistance from the left hand to open and close. The stiffness tends to improve as the patient progresses through the day, but returns if the patient remains still for extended periods.    The patient also reports decreased hand strength, making tasks such as opening soda bottles or jars difficult. However, they deny any associated numbness or increased incidence of dropping items. No visible joint swelling has been observed.   The patient is currently on hydroxychloroquine  300mg  and takes gabapentin  600mg  at night, as well as trazodone  25mg  to aid sleep. Gabapentin  helps but does not take at night if working the next day due to residual drowsiness. Trazodone  helps but has side effects if taking high dose.   The patient also  reports a period of approximately one week to ten days about a month ago when they felt like they were in a 'subacute flare', experiencing increased pain, fatigue, and sleep disturbances. They chose to endure this period without seeking medical attention.   The patient is also on Vascepa , a prescription-strength fish oil supplement, and takes a calcium supplement. They have previously used topical diclofenac  for Achilles tendonitis and still have some available for use.      Previous HPI 02/08/2023 MARVIS BAKKEN is a 58 y.o. female here for follow-up with Sjogren's syndrome seropositive RA characterized by intermittent fever and previous CNS inflammation on hydroxychloroquine  300 mg daily.  She has been doing worse for the past 3 to 4 weeks particularly with pain in her neck and upper back.  This pain is most bothersome at nighttime causing difficulty sleeping and some sleep fragmentation.  She was prescribed tizanidine  and Ultracet  for this but with only a limited response to the treatment.  In addition to the gabapentin  600 mg at night which she has been on previously.  Did not recall any specific injury or activity change to account for increased symptoms.  Does not have widespread increase of symptoms and no recurrence of fever or neurologic symptoms.  She does feel her fatigue has been worse but not sure if this is related to the sleep disruption.   Labs reviewed 11/09/22 JAKYAH BRADBY is a 58 y.o. female here for follow up for Sjogren syndrome and seropositive RA with intermittent fever and previous CNS inflammation after starting hydroxychloroquine  200 mg daily.  She had another flareup between Christmas and New Year's pretty debilitating spent the majority of 4 days in bed.  She only had low fevers during this time but also severe fatigue.  She is having fairly diffuse pain at nighttime disrupting her sleep more than half of the nights.  Gabapentin  and low-dose tramadol  have been helpful for  sleeping through the night.  May be see some benefit with muscle relaxer medication but avoid this due to getting a drowsiness or hangover effect into the following day.  Not seeing any obvious peripheral joint swelling.  So far she is not sure whether hydroxychloroquine  is making much difference although the febrile episode was much less severe compared to the that landed her in the hospital last year.   08/25/22 SHERI GATCHEL is a 58 y.o. female here for follow up for sjogren syndrome and suspected seropositive RA. Lab evaluation at initial visit showing increased CCP Ab titer but normal inflammatory markers. She continues to experience bilateral hand stiffness and swelling most severe in the morning. Also having pain at the left great toe and schedule to see orthopedic surgery clinic for this. She still feels fatigued similar as before. She had recent ophthalmology follow up and left eye amniotic membrane procedure for dry eye.   KIZZI OVERBEY is a 58 y.o. female here for evaluation of fevers and pain associated with positive CCP Abs and history of sjogren syndrome. She was recently hospitalized with SIRS and suspected infection but no organism identified also concerned for underlying autoimmune process. She completed a course of doxycycline  for possible tickborne illness. CMV testing was positive for IgG and IgM. Ferritin low at 11 with findings consistent for IDA.    Symptoms started with severe low back pain then progressed to whole spine and headache and fevers. After one week hospitalization symptoms improved and has returned mostly to baseline. Still has pain in the low back more frequently than before. Notices left hip pain pretty regularly and popping or cracking sensation in her thoracic spine. Stiffness and pain commonly in both feet this lasts for about 1 hour also gets elbows and hands for 30-60 minutes. She takes ibuprofen rarely due to previous bariatric surgery. Takes gabapentin  600 mg at  night when she is not working which helps partially.   She has a history of gastric sleeve surgery and has some chronic diarrhea but this was more related to sphincter of Oddi dysfunction before this too. She had 3 left knee surgeries she had meniscal injury there as a teenager. She has a family history of RA in her mother who had deforming disease involving both hands.   Labs reviewed 03/2022 ANA neg RF neg CCP 81 Ferritin 11 Lyme neg Erlichiosis IgM equivocal CMV IgG pos IgM pos  Review of Systems  Constitutional:  Positive for fatigue.  HENT:  Positive for mouth dryness. Negative for mouth sores.   Eyes:  Positive for dryness.  Respiratory:  Negative for shortness of breath.  Cardiovascular:  Negative for chest pain and palpitations.  Gastrointestinal:  Negative for blood in stool, constipation and diarrhea.  Endocrine: Negative for increased urination.  Genitourinary:  Negative for involuntary urination.  Musculoskeletal:  Positive for joint pain, joint pain, joint swelling, myalgias, muscle weakness, morning stiffness, muscle tenderness and myalgias. Negative for gait problem.  Skin:  Positive for rash. Negative for color change, hair loss and sensitivity to sunlight.  Allergic/Immunologic: Negative for susceptible to infections.  Neurological:  Negative for dizziness and headaches.  Hematological:  Negative for swollen glands.  Psychiatric/Behavioral:  Positive for sleep disturbance. Negative for depressed mood. The patient is not nervous/anxious.     PMFS History:  Patient Active Problem List   Diagnosis Date Noted   High risk medication use 04/11/2024   Well adult exam 12/22/2023   Upper back pain 02/08/2023   At increased risk for cardiovascular disease 12/21/2022   Seropositive rheumatoid arthritis (HCC) 07/22/2022   Joint pain 06/02/2022   FUO (fever of unknown origin) 04/19/2022   Sjogren's syndrome (HCC) 04/03/2022   Rosacea 02/04/2022   Encounter for  counseling 02/04/2022   Essential hypertension 09/27/2021   Insomnia secondary to depression with anxiety 09/27/2021   Tick bite 01/25/2021   Statin intolerance 04/12/2019   Sicca syndrome (HCC) 02/05/2019   Elevated transaminase measurement 11/02/2017   History of high cholesterol 05/27/2016   History of gestational diabetes 05/27/2016   History of acute pancreatitis 05/27/2016   Prediabetes 04/17/2015   Status post laparoscopic sleeve gastrectomy June 2016 03/25/2015   Hyperlipemia 01/02/2015   Dysfunction of sphincter of Oddi 08/16/2013    Past Medical History:  Diagnosis Date   GERD (gastroesophageal reflux disease)    Heartburn    Hyperlipidemia    Hypertension    IBS (irritable bowel syndrome)    PONV (postoperative nausea and vomiting)    severe   Pre-diabetes    Prediabetes    RA (rheumatoid arthritis) (HCC)    Rosacea    Sjogren's syndrome (HCC)    Sphincter of Oddi dysfunction     Family History  Problem Relation Age of Onset   Hypertension Mother    Hyperlipidemia Mother    Fibromyalgia Mother    Parkinson's disease Mother    Rheum arthritis Mother    Hyperlipidemia Father    Hypertension Father    Parkinson's disease Brother    Mental retardation Brother    Hyperlipidemia Brother    Breast cancer Maternal Grandmother 30   Healthy Daughter    Breast cancer Cousin 45   Colon cancer Neg Hx    Esophageal cancer Neg Hx    Rectal cancer Neg Hx    Stomach cancer Neg Hx    Past Surgical History:  Procedure Laterality Date   ABDOMINOPLASTY  05/25/2012   ARTHRODESIS METATARSALPHALANGEAL JOINT (MTPJ) Left 08/11/2023   Procedure: LEFT HALLUX ARTHRODESIS METATARSALPHALANGEAL JOINT (MTPJ);  Surgeon: Kit Rush, MD;  Location: [facility];  Service: Orthopedics;  Laterality: Left;   BUNIONECTOMY Left 08/11/2023   Procedure: FELICIE EDWARDS;  Surgeon: Kit Rush, MD;  Location: [facility];  Service: Orthopedics;  Laterality:  Left;   CESAREAN SECTION  1991, 1998   x2   CHOLECYSTECTOMY  1995   IRRIGATION AND DEBRIDEMENT ABSCESS Right 05/22/2013   Procedure: MINOR INCISION AND DRAINAGE OF ABSCESS;  Surgeon: Arley JONELLE Curia, MD;  Location: [facility];  Service: Orthopedics;  Laterality: Right;   KNEE ARTHROSCOPY Left    x3  LAPAROSCOPIC GASTRIC SLEEVE RESECTION N/A 03/25/2015   Procedure: LAPAROSCOPIC GASTRIC SLEEVE RESECTION UPPER ENDOSCOPY;  Surgeon: Donnice Lunger, MD;  Location: [facility];  Service: General;  Laterality: N/A;   VAGINAL BIRTH AFTER CESAREAN SECTION  1993, 2001   x2   Social History   Social History Narrative   Not on file   Immunization History  Administered Date(s) Administered   Influenza,inj,Quad PF,6+ Mos 05/27/2016, 06/27/2017, 06/04/2020, 06/02/2022   Influenza-Unspecified 07/02/2013, 07/26/2014, 06/28/2019, 06/21/2023   Janssen (J&J) SARS-COV-2 Vaccination 01/02/2020   Tdap 03/21/2017   Zoster Recombinant(Shingrix) 06/04/2020, 09/16/2020     Objective: Vital Signs: BP 116/75 (BP Location: Left Arm, Patient Position: Sitting, Cuff Size: Normal)   Pulse 74   Resp 16   Ht 5' 1 (1.549 m)   Wt 135 lb 8 oz (61.5 kg)   BMI 25.60 kg/m    Physical Exam Eyes:     Conjunctiva/sclera: Conjunctivae normal.  Cardiovascular:     Rate and Rhythm: Normal rate and regular rhythm.  Pulmonary:     Effort: Pulmonary effort is normal.     Breath sounds: Normal breath sounds.  Lymphadenopathy:     Cervical: No cervical adenopathy.  Skin:    General: Skin is warm and dry.     Findings: Rash present.     Comments: Right lower abdomen flat, erythematous, blanching rash  Neurological:     Mental Status: She is alert.  Psychiatric:        Mood and Affect: Mood normal.      Musculoskeletal Exam:  Shoulders full ROM no tenderness or swelling Elbows full ROM, no swelling, right tenderness at medial epicondyle left tenderness at lateral and medial epicondyle Right wrist range  of motion is slightly restricted, some tenderness along joint line but no palpable effusion Right 2nd MCP tenderness to pressure, left 4th finger painful no palpable synovitis Knees full ROM no tenderness or swelling Ankles full ROM no tenderness or swelling Right 1st MTP bunion, tenderness to pressure   Investigation: No additional findings.  Imaging: No results found.  Recent Labs: Lab Results  Component Value Date   WBC 4.5 02/21/2024   HGB 12.3 02/21/2024   PLT 304 02/21/2024   NA 142 02/21/2024   K 4.0 02/21/2024   CL 106 02/21/2024   CO2 29 02/21/2024   GLUCOSE 40 (L) 02/21/2024   BUN 8 02/21/2024   CREATININE 0.65 02/21/2024   BILITOT 0.3 02/21/2024   ALKPHOS 77 12/20/2023   AST 19 02/21/2024   ALT 14 02/21/2024   PROT 6.8 02/21/2024   ALBUMIN 4.5 12/20/2023   CALCIUM 8.9 02/21/2024   GFRAA 111 03/25/2020   QFTBGOLDPLUS NEGATIVE 02/21/2024    Speciality Comments: Amy Harper O.D. eye exams  Procedures:  No procedures performed Allergies: Patient has no known allergies.   Assessment / Plan:     Visit Diagnoses: Seropositive rheumatoid arthritis (HCC) - Plan: Sedimentation rate Persistent pain and suboptimal response to Enbrel , affecting quality of life. Current pain management is inadequate. Enbrel 's efficacy is under evaluation for three months. Consider alternative TNF inhibitors or assess for osteoarthritis if no improvement. - Continue Enbrel  50 mg [place] weekly for at least three months to assess full efficacy. - Continue ultracet  37.5-325 QHS - Consider alternative TNF inhibitors if seeing more improvement but persistent rash - Evaluate for more osteoarthritis Sx management if RA activity is not the primary cause of symptoms, possibly neuropathic pain medications like Lyrica  High risk medication use - hydroxychloroquine  300 mg daily  for now. Plan to start on Enbrel  50 mg subcu weekly - Plan: CBC with Differential/Platelet, Comprehensive metabolic panel with  GFR, Drug Screen, Urine - Checking CBC and CMP for medication monitoring after Enbrel  start - Checking urine drug screen for long-term use of low-dose tramadol   Injection site reactions to Enbrel  (etanercept ) Injection site reactions characterized by swelling and itching, likely due to the solution. Reactions are not debilitating and may improve over time. - Use ice or antihistamines to manage injection site reactions.  Right foot bunion with prior surgical intervention Right foot bunion with prior surgical intervention, including a plate insertion. Current symptoms include pain and difficulty wearing certain footwear.      Orders: Orders Placed This Encounter  Procedures   Sedimentation rate   CBC with Differential/Platelet   Comprehensive metabolic panel with GFR   Drug Screen, Urine   No orders of the defined types were placed in this encounter.    Follow-Up Instructions: Return in about 2 months (around 07/24/2024) for RA on ENB f/u 2mos.   Lonni LELON Ester, MD  Note - This record has been created using [employer].  Chart creation errors have been sought, but may not always  have been located. Such creation errors do not reflect on  the standard of medical care.

## 2024-05-14 ENCOUNTER — Other Ambulatory Visit: Payer: Self-pay | Admitting: Family Medicine

## 2024-05-14 ENCOUNTER — Other Ambulatory Visit (HOSPITAL_COMMUNITY): Payer: Self-pay

## 2024-05-14 ENCOUNTER — Other Ambulatory Visit: Payer: Self-pay

## 2024-05-14 MED ORDER — FINACEA 15 % EX FOAM
1.0000 | Freq: Two times a day (BID) | CUTANEOUS | 0 refills | Status: DC
  Filled 2024-05-14: qty 50, 30d supply, fill #0

## 2024-05-14 MED ORDER — OZEMPIC (1 MG/DOSE) 4 MG/3ML ~~LOC~~ SOPN
1.0000 mg | PEN_INJECTOR | SUBCUTANEOUS | 1 refills | Status: DC
  Filled 2024-05-14: qty 3, 28d supply, fill #0
  Filled 2024-06-13: qty 3, 28d supply, fill #1

## 2024-05-14 NOTE — Progress Notes (Signed)
 Specialty Pharmacy Refill Coordination Note  Kristine Stevens is a 58 y.o. female contacted today regarding refills of specialty medication(s) Etanercept  (Enbrel  SureClick)   Patient requested (Patient-Rptd) Delivery   Delivery date: 05/22/24   Verified address: (Patient-Rptd) [address] [place] [address]   Medication will be filled on 08.25.25.

## 2024-05-21 ENCOUNTER — Other Ambulatory Visit: Payer: Self-pay

## 2024-05-24 ENCOUNTER — Encounter: Payer: Self-pay | Admitting: Internal Medicine

## 2024-05-24 ENCOUNTER — Ambulatory Visit: Attending: Internal Medicine | Admitting: Internal Medicine

## 2024-05-24 VITALS — BP 116/75 | HR 74 | Resp 16 | Ht 61.0 in | Wt 135.5 lb

## 2024-05-24 DIAGNOSIS — M059 Rheumatoid arthritis with rheumatoid factor, unspecified: Secondary | ICD-10-CM

## 2024-05-24 DIAGNOSIS — Z79899 Other long term (current) drug therapy: Secondary | ICD-10-CM

## 2024-05-24 DIAGNOSIS — M3501 Sicca syndrome with keratoconjunctivitis: Secondary | ICD-10-CM | POA: Diagnosis not present

## 2024-06-05 DIAGNOSIS — Z79899 Other long term (current) drug therapy: Secondary | ICD-10-CM | POA: Diagnosis not present

## 2024-06-05 DIAGNOSIS — M059 Rheumatoid arthritis with rheumatoid factor, unspecified: Secondary | ICD-10-CM | POA: Diagnosis not present

## 2024-06-06 LAB — CBC WITH DIFFERENTIAL/PLATELET
Basophils Absolute: 0 x10E3/uL (ref 0.0–0.2)
Basos: 1 %
EOS (ABSOLUTE): 0.1 x10E3/uL (ref 0.0–0.4)
Eos: 2 %
Hematocrit: 36.4 % (ref 34.0–46.6)
Hemoglobin: 11.6 g/dL (ref 11.1–15.9)
Immature Grans (Abs): 0 x10E3/uL (ref 0.0–0.1)
Immature Granulocytes: 0 %
Lymphocytes Absolute: 2 x10E3/uL (ref 0.7–3.1)
Lymphs: 56 %
MCH: 28 pg (ref 26.6–33.0)
MCHC: 31.9 g/dL (ref 31.5–35.7)
MCV: 88 fL (ref 79–97)
Monocytes Absolute: 0.3 x10E3/uL (ref 0.1–0.9)
Monocytes: 9 %
Neutrophils Absolute: 1.1 x10E3/uL — ABNORMAL LOW (ref 1.4–7.0)
Neutrophils: 32 %
Platelets: 276 x10E3/uL (ref 150–450)
RBC: 4.15 x10E6/uL (ref 3.77–5.28)
RDW: 14.3 % (ref 11.7–15.4)
WBC: 3.5 x10E3/uL (ref 3.4–10.8)

## 2024-06-06 LAB — COMPREHENSIVE METABOLIC PANEL WITH GFR
ALT: 12 IU/L (ref 0–32)
AST: 16 IU/L (ref 0–40)
Albumin: 4.1 g/dL (ref 3.8–4.9)
Alkaline Phosphatase: 67 IU/L (ref 44–121)
BUN/Creatinine Ratio: 14 (ref 9–23)
BUN: 10 mg/dL (ref 6–24)
Bilirubin Total: 0.3 mg/dL (ref 0.0–1.2)
CO2: 22 mmol/L (ref 20–29)
Calcium: 8.9 mg/dL (ref 8.7–10.2)
Chloride: 104 mmol/L (ref 96–106)
Creatinine, Ser: 0.71 mg/dL (ref 0.57–1.00)
Globulin, Total: 2.4 g/dL (ref 1.5–4.5)
Glucose: 90 mg/dL (ref 70–99)
Potassium: 3.9 mmol/L (ref 3.5–5.2)
Sodium: 140 mmol/L (ref 134–144)
Total Protein: 6.5 g/dL (ref 6.0–8.5)
eGFR: 99 mL/min/1.73 (ref >59)

## 2024-06-06 LAB — SEDIMENTATION RATE: Sed Rate: 2 mm/h (ref 0–40)

## 2024-06-07 LAB — DRUG SCREEN, URINE
Amphetamines, Urine: NEGATIVE ng/mL
Barbiturate screen, urine: NEGATIVE ng/mL
Benzodiazepine Quant, Ur: NEGATIVE ng/mL
Cannabinoid Quant, Ur: NEGATIVE ng/mL
Cocaine (Metab.): NEGATIVE ng/mL
Opiate Quant, Ur: NEGATIVE ng/mL
PCP Quant, Ur: NEGATIVE ng/mL

## 2024-06-13 ENCOUNTER — Other Ambulatory Visit: Payer: Self-pay

## 2024-06-15 ENCOUNTER — Other Ambulatory Visit: Payer: Self-pay

## 2024-06-15 ENCOUNTER — Other Ambulatory Visit: Payer: Self-pay | Admitting: Pharmacy Technician

## 2024-06-15 ENCOUNTER — Encounter (INDEPENDENT_AMBULATORY_CARE_PROVIDER_SITE_OTHER): Payer: Self-pay

## 2024-06-15 NOTE — Progress Notes (Signed)
 Specialty Pharmacy Refill Coordination Note  OMER MONTER is a 58 y.o. female contacted today regarding refills of specialty medication(s) Etanercept  (Enbrel  SureClick)   Patient requested (Patient-Rptd) Delivery   Delivery date: 06/20/24   Verified address: [address] [place] [address]   Medication will be filled on 06/19/24. Injection dates: 1 inj for 9/23 and 9/30, 10/7, 10/14   Answered questionnaire.

## 2024-06-26 ENCOUNTER — Telehealth: Payer: Self-pay

## 2024-06-26 ENCOUNTER — Other Ambulatory Visit: Payer: Self-pay | Admitting: Family Medicine

## 2024-06-26 ENCOUNTER — Other Ambulatory Visit (HOSPITAL_COMMUNITY): Payer: Self-pay

## 2024-06-26 DIAGNOSIS — Z1231 Encounter for screening mammogram for malignant neoplasm of breast: Secondary | ICD-10-CM

## 2024-06-26 NOTE — Telephone Encounter (Signed)
 Pharmacy Patient Advocate Encounter   Received notification from CoverMyMeds that prior authorization for Ozempic  is due for renewal.   Insurance verification completed.   The patient is insured through [facility].  Ozempic Brena is approved exclusively as an adjunct to diet and exercise to improve glycemic  control in adults with type 2 diabetes mellitus. A review of patient's medical chart reveals no documented diagnosis of type 2 diabetes or an A1C indicative of diabetes. Therefore, they do not  currently meet the criteria for prior authorization of this medication. PA renewal not submitted.

## 2024-06-27 ENCOUNTER — Encounter: Payer: Self-pay | Admitting: Family Medicine

## 2024-07-02 ENCOUNTER — Other Ambulatory Visit: Payer: Self-pay | Admitting: Family Medicine

## 2024-07-02 ENCOUNTER — Other Ambulatory Visit: Payer: Self-pay | Admitting: Internal Medicine

## 2024-07-02 DIAGNOSIS — F4321 Adjustment disorder with depressed mood: Secondary | ICD-10-CM

## 2024-07-02 DIAGNOSIS — M059 Rheumatoid arthritis with rheumatoid factor, unspecified: Secondary | ICD-10-CM

## 2024-07-02 DIAGNOSIS — Z79899 Other long term (current) drug therapy: Secondary | ICD-10-CM

## 2024-07-03 ENCOUNTER — Other Ambulatory Visit: Payer: Self-pay

## 2024-07-03 ENCOUNTER — Other Ambulatory Visit (HOSPITAL_COMMUNITY): Payer: Self-pay

## 2024-07-03 ENCOUNTER — Ambulatory Visit: Payer: Self-pay | Admitting: Internal Medicine

## 2024-07-03 MED ORDER — CITALOPRAM HYDROBROMIDE 20 MG PO TABS
30.0000 mg | ORAL_TABLET | Freq: Every day | ORAL | 1 refills | Status: AC
  Filled 2024-07-03 – 2024-09-28 (×5): qty 135, 90d supply, fill #0
  Filled 2024-09-28: qty 135, 90d supply, fill #1
  Filled 2024-11-01: qty 135, 90d supply, fill #0

## 2024-07-03 MED ORDER — ENBREL SURECLICK 50 MG/ML ~~LOC~~ SOAJ
50.0000 mg | SUBCUTANEOUS | 1 refills | Status: DC
  Filled 2024-07-03 – 2024-07-10 (×4): qty 4, 28d supply, fill #0
  Filled 2024-08-02: qty 4, 28d supply, fill #1

## 2024-07-03 MED ORDER — TIZANIDINE HCL 2 MG PO TABS
2.0000 mg | ORAL_TABLET | Freq: Three times a day (TID) | ORAL | 0 refills | Status: DC | PRN
  Filled 2024-07-03: qty 30, 5d supply, fill #0

## 2024-07-03 NOTE — Progress Notes (Signed)
 Mildly low neutrophils 1.1 but hers have been as low as 1.4 on recent labs before starting Enbrel , okay to monitor without new drug changes. Other labs normal.

## 2024-07-03 NOTE — Telephone Encounter (Signed)
 Last Fill: 04/24/2024  Labs: 06/05/2024 Neutrophils Abs 1.1 Rest of CBC and WMP WNL   TB Gold: 02/21/2024  TB Gold Negative  Next Visit: 08/14/2024  Last Visit: 05/24/2024  IK:Dzmnendpupcz rheumatoid arthritis   Current Dose per office note 05/24/2024: Enbrel  50 mg subcu weekly   Okay to refill Enbrel ?

## 2024-07-04 ENCOUNTER — Other Ambulatory Visit (HOSPITAL_COMMUNITY): Payer: Self-pay

## 2024-07-04 ENCOUNTER — Other Ambulatory Visit: Payer: Self-pay

## 2024-07-05 ENCOUNTER — Other Ambulatory Visit (HOSPITAL_COMMUNITY): Payer: Self-pay

## 2024-07-05 MED ORDER — OZEMPIC (1 MG/DOSE) 4 MG/3ML ~~LOC~~ SOPN
1.0000 mg | PEN_INJECTOR | SUBCUTANEOUS | 1 refills | Status: DC
  Filled 2024-07-05 – 2024-07-06 (×2): qty 3, 28d supply, fill #0
  Filled 2024-08-05: qty 3, 28d supply, fill #1

## 2024-07-06 ENCOUNTER — Other Ambulatory Visit (HOSPITAL_COMMUNITY): Payer: Self-pay

## 2024-07-10 ENCOUNTER — Other Ambulatory Visit (HOSPITAL_COMMUNITY): Payer: Self-pay

## 2024-07-10 ENCOUNTER — Encounter (INDEPENDENT_AMBULATORY_CARE_PROVIDER_SITE_OTHER): Payer: Self-pay

## 2024-07-10 ENCOUNTER — Other Ambulatory Visit: Payer: Self-pay

## 2024-07-10 NOTE — Progress Notes (Signed)
 Specialty Pharmacy Refill Coordination Note  Kristine Stevens is a 58 y.o. female contacted today regarding refills of specialty medication(s) Etanercept  (Enbrel  SureClick)   Patient requested Delivery   Delivery date: 07/13/24   Verified address: [address] [place]   Medication will be filled on 07/12/24.

## 2024-07-11 ENCOUNTER — Other Ambulatory Visit: Payer: Self-pay

## 2024-07-18 ENCOUNTER — Ambulatory Visit: Payer: Self-pay | Admitting: Family Medicine

## 2024-07-18 NOTE — Telephone Encounter (Signed)
 FYI Only or Action Required?: FYI only for provider.  Patient was last seen in primary care on 12/22/2023 by Kristine Bring, DO.  Called Nurse Triage reporting Hip Pain.  Symptoms began yesterday.  Interventions attempted: OTC medications: Advil dual action.  Symptoms are: gradually worsening.  Triage Disposition: See HCP Within 4 Hours (Or PCP Triage)  Patient/caregiver understands and will follow disposition?: Yes             Copied from CRM [id number]. Topic: Clinical - Red Word Triage >> Jul 18, 2024  8:32 AM Kristine Stevens wrote: Red Word that prompted transfer to Nurse Triage: Something is wrong with her left hip, can barely walk, in pain. She has arthritis. Reason for Disposition  [1] SEVERE pain (e.g., excruciating, unable to do any normal activities) AND [2] not improved after 2 hours of pain medicine  Answer Assessment - Initial Assessment Questions Pt going to urgent care today as no appointment availability in office.   Left hip pain Onset: yesterday Denies having this before but pt states she has rheumatoid arthritis Pt states it started after a morning walk; pt started limping later in day Laying still 3/10 pain level, with movement 8/10 pain level Denies numbness  Protocols used: Hip Pain-A-AH

## 2024-07-19 NOTE — Telephone Encounter (Signed)
 FYI - per Triage RN note - due to no appointment availability, patient has decided to go to UC for the following symptoms.

## 2024-07-20 ENCOUNTER — Other Ambulatory Visit (HOSPITAL_COMMUNITY): Payer: Self-pay

## 2024-07-20 ENCOUNTER — Ambulatory Visit: Admitting: Family Medicine

## 2024-07-20 ENCOUNTER — Encounter: Payer: Self-pay | Admitting: Family Medicine

## 2024-07-20 VITALS — BP 113/73 | HR 79 | Ht 61.0 in | Wt 131.0 lb

## 2024-07-20 DIAGNOSIS — G5702 Lesion of sciatic nerve, left lower limb: Secondary | ICD-10-CM | POA: Diagnosis not present

## 2024-07-20 MED ORDER — HYDROCODONE-ACETAMINOPHEN 5-325 MG PO TABS
1.0000 | ORAL_TABLET | Freq: Three times a day (TID) | ORAL | 0 refills | Status: DC | PRN
  Filled 2024-07-20: qty 15, 5d supply, fill #0

## 2024-07-20 MED ORDER — METHYLPREDNISOLONE ACETATE 80 MG/ML IJ SUSP
80.0000 mg | Freq: Once | INTRAMUSCULAR | Status: AC
  Administered 2024-07-20: 80 mg via INTRAMUSCULAR

## 2024-07-20 NOTE — Assessment & Plan Note (Signed)
 Tightness in piriformis with pain along insertion point.  Depo-medrol  injection given in clinic today.  Handout for HEP given.  Norco PRN.  If not improving will plan to refer to sports medicine.

## 2024-07-20 NOTE — Progress Notes (Signed)
 Kristine Stevens - 58 y.o. female MRN [id number]  Date of birth: 10-Jan-1966  Subjective Chief Complaint  Patient presents with   Hip Pain    LEFT- onset Tuesday, could barely walk, tylenol  did not help, has been using heating pad which has helped, did take Ultracet  tab and it kind of helped No falls no injury  Mainly lateral not in the groin area    HPI Kristine Stevens is a 58 y.o. female here today with complaint of L hip pain.  Fairly sudden in onset about 2-3 days ago.  No falls, overuse or injury. Pain is along the lateral hip.  Pain is worse with walking.  Heating pad has helped. No numbness, tingling or weakness. Can't really utilize NSAID's due to previous bariatric surgery. Did not get good relief from ultracet .   ROS:  A comprehensive ROS was completed and negative except as noted per HPI  No Known Allergies  Past Medical History:  Diagnosis Date   GERD (gastroesophageal reflux disease)    Heartburn    Hyperlipidemia    Hypertension    IBS (irritable bowel syndrome)    PONV (postoperative nausea and vomiting)    severe   Pre-diabetes    Prediabetes    RA (rheumatoid arthritis) (HCC)    Rosacea    Sjogren's syndrome    Sphincter of Oddi dysfunction     Past Surgical History:  Procedure Laterality Date   ABDOMINOPLASTY  05/25/2012   ARTHRODESIS METATARSALPHALANGEAL JOINT (MTPJ) Left 08/11/2023   Procedure: LEFT HALLUX ARTHRODESIS METATARSALPHALANGEAL JOINT (MTPJ);  Surgeon: Kit Rush, MD;  Location: [facility];  Service: Orthopedics;  Laterality: Left;   BUNIONECTOMY Left 08/11/2023   Procedure: FELICIE EDWARDS;  Surgeon: Kit Rush, MD;  Location: [facility];  Service: Orthopedics;  Laterality: Left;   CESAREAN SECTION  1991, 1998   x2   CHOLECYSTECTOMY  1995   IRRIGATION AND DEBRIDEMENT ABSCESS Right 05/22/2013   Procedure: MINOR INCISION AND DRAINAGE OF ABSCESS;  Surgeon: Arley JONELLE Curia, MD;  Location: [facility];   Service: Orthopedics;  Laterality: Right;   KNEE ARTHROSCOPY Left    x3   LAPAROSCOPIC GASTRIC SLEEVE RESECTION N/A 03/25/2015   Procedure: LAPAROSCOPIC GASTRIC SLEEVE RESECTION UPPER ENDOSCOPY;  Surgeon: Donnice Lunger, MD;  Location: [facility];  Service: General;  Laterality: N/A;   VAGINAL BIRTH AFTER CESAREAN SECTION  1993, 2001   x2    Social History   Socioeconomic History   Marital status: Married    Spouse name: Not on file   Number of children: Not on file   Years of education: Not on file   Highest education level: Master's degree (e.g., MA, MS, MEng, MEd, MSW, MBA)  Occupational History   Not on file  Tobacco Use   Smoking status: Never    Passive exposure: Never   Smokeless tobacco: Never  Vaping Use   Vaping status: Never Used  Substance and Sexual Activity   Alcohol use: Yes    Comment: rare   Drug use: No   Sexual activity: Yes  Other Topics Concern   Not on file  Social History Narrative   Not on file   Social Drivers of Health   Financial Resource Strain: Low Risk  (12/22/2023)   Overall Financial Resource Strain (CARDIA)    Difficulty of Paying Living Expenses: Not hard at all  Food Insecurity: No Food Insecurity (12/22/2023)   Hunger Vital Sign    Worried About Running  Out of Food in the Last Year: Never true    Ran Out of Food in the Last Year: Never true  Transportation Needs: No Transportation Needs (12/22/2023)   PRAPARE - Administrator, Civil Service (Medical): No    Lack of Transportation (Non-Medical): No  Physical Activity: Sufficiently Active (12/22/2023)   Exercise Vital Sign    Days of Exercise per Week: 6 days    Minutes of Exercise per Session: 50 min  Stress: No Stress Concern Present (12/22/2023)   [employer] of Occupational Health - Occupational Stress Questionnaire    Feeling of Stress : Only a little  Social Connections: Socially Integrated (12/22/2023)   Social Connection and Isolation Panel    Frequency of  Communication with Friends and Family: Three times a week    Frequency of Social Gatherings with Friends and Family: Three times a week    Attends Religious Services: More than 4 times per year    Active Member of Clubs or Organizations: Yes    Attends Engineer, structural: More than 4 times per year    Marital Status: Married    Family History  Problem Relation Age of Onset   Hypertension Mother    Hyperlipidemia Mother    Fibromyalgia Mother    Parkinson's disease Mother    Rheum arthritis Mother    Hyperlipidemia Father    Hypertension Father    Parkinson's disease Brother    Mental retardation Brother    Hyperlipidemia Brother    Breast cancer Maternal Grandmother 26   Healthy Daughter    Breast cancer Cousin 45   Colon cancer Neg Hx    Esophageal cancer Neg Hx    Rectal cancer Neg Hx    Stomach cancer Neg Hx     Health Maintenance  Topic Date Due   Hepatitis B Vaccines 19-59 Average Risk (1 of 3 - 19+ 3-dose series) Never done   Pneumococcal Vaccine: 50+ Years (1 of 1 - PCV) Never done   Cervical Cancer Screening (HPV/Pap Cotest)  04/13/2019   Mammogram  08/09/2024   COVID-19 Vaccine (2 - Janssen risk series) 08/05/2024 (Originally 01/30/2020)   Hepatitis C Screening  12/21/2024 (Originally 11/22/1984)   HIV Screening  12/21/2024 (Originally 11/22/1981)   DTaP/Tdap/Td (2 - Td or Tdap) 03/22/2027   Colonoscopy  05/26/2027   Influenza Vaccine  Completed   Zoster Vaccines- Shingrix  Completed   HPV VACCINES  Aged Out   Meningococcal B Vaccine  Aged Out     ----------------------------------------------------------------------------------------------------------------------------------------------------------------------------------------------------------------- Physical Exam BP 113/73 (BP Location: Left Arm, Patient Position: Sitting, Cuff Size: Normal)   Pulse 79   Ht 5' 1 (1.549 m)   Wt 131 lb 0.6 oz (59.4 kg)   SpO2 100%   BMI 24.76 kg/m   Physical  Exam Constitutional:      Appearance: Normal appearance.  HENT:     Head: Normocephalic and atraumatic.  Musculoskeletal:     Comments: TTP along the posterior portion of the greater trochanter.  Pain with external rotation of the hip.   Neurological:     General: No focal deficit present.     Mental Status: She is alert.     ------------------------------------------------------------------------------------------------------------------------------------------------------------------------------------------------------------------- Assessment and Plan  Piriformis syndrome of left side Tightness in piriformis with pain along insertion point.  Depo-medrol  injection given in clinic today.  Handout for HEP given.  Norco PRN.  If not improving will plan to refer to sports medicine.    Meds ordered this  encounter  Medications   HYDROcodone -acetaminophen  (NORCO/VICODIN) 5-325 MG tablet    Sig: Take 1 tablet by mouth every 8 (eight) hours as needed for moderate pain (pain score 4-6).    Dispense:  15 tablet    Refill:  0   methylPREDNISolone  acetate (DEPO-MEDROL ) injection 80 mg    No follow-ups on file.

## 2024-07-31 NOTE — Progress Notes (Signed)
 Office Visit Note  Patient: Kristine Stevens             Date of Birth: 04-May-1966           MRN: [id number]             PCP: Alvia Bring, DO Referring: Alvia Bring, DO Visit Date: 08/14/2024   Subjective:   Discussed the use of AI scribe software for clinical note transcription with the patient, who gave verbal consent to proceed.  History of Present Illness   LUVENA WENTLING is a 58 y.o. female here for follow up with rheumatoid arthritis ron Enbrel  50 mg [place] weekly.   She has experienced improvement in her symptoms since starting Enbrel , although the benefits took some time to manifest. Her hands are less stiff and painful, and while she still experiences morning stiffness, it resolves more quickly and is less achy throughout the day. Her elbow pain has significantly improved, with both elbows feeling much better.  She continues to have issues with her right foot, for which she anticipates surgery in January. Additionally, she experienced a sudden onset of left hip pain, which has improved over two weeks but initially made walking difficult. The pain is now mild and position-dependent, with soreness when sitting or lying on it. She managed the hip pain with heat, a heating pad, and rest for about four days. She also received a steroid injection from her primary care provider, which helped alleviate the symptoms.  No recent illnesses outside of her arthritis symptoms. Her current medication regimen includes Ultracet , which she takes as needed for pain relief, as she cannot tolerate NSAIDs.  A recent lab test showed a slightly low white blood count, specifically a 1.1 absolute neutrophil count. She reports noticing increased diarrhea closer to the enbrel  dose timings. Not associated with abdominal pain, no mucous or blood.      Previous HPI 05/24/2024 KRISLYN DONNAN is a 58 y.o. female here for follow up with rheumatoid arthritis recently switched to Enbrel  50 mg [place] weekly.   She has  experienced injection site reactions, including swelling and itching, which typically begin a day or so after administration. The reactions have varied in size, with the most recent being the smallest on her lower abdomen. She has completed four doses so far.   Her rheumatoid arthritis pain is constant and affects her quality of life, particularly in her hands and elbows. The pain is not limited to the morning and can occur after periods of inactivity. She has difficulty holding her grandchildren due to the pain. She takes Ultracet  once a day, usually at night, but finds it ineffective. She has stopped taking gabapentin  as it was no longer effective.   She discontinued hydroxychloroquine  due to its effect on her blood sugar levels, which have since stabilized. She has not noticed any visible changes in swelling or redness since stopping the medication.   She had a cold a few weeks ago but recovered quickly.. She experiences pain in her fourth finger and has difficulty closing her index finger at times.       Previous HPI 04/11/2024 LUCELLA POMMIER is a 58 year old female with rheumatoid arthritis on hydroxychloroquine  300 mg daily who presents with worsening joint pain and medication side effects after starting methotrexate .   She experiences worsening joint pain, particularly in her hands, elbows, and spine, which significantly affects her ability to work, especially during night shifts when her elbows ache considerably.  She has been on methotrexate  but experiences increased fatigue and malaise with each dose. The medication has not improved her symptoms despite several doses, and she has not taken her injection since last Thursday due to running out of medication and uncertainty about continuing it. She reports low energy levels and a diminished quality of life, stating that she has to plan her schedule around feeling unwell after taking methotrexate .   She mentions a low fasting glucose level of  40, with some symptoms of more low energy no specific sweats, tremors, or neurologic effects. She frequently experiences hypoglycemia, with her glucose monitor often alerting her to very low blood sugar levels. She has been on a stable dose of 1 mg of Ozempic  for over six months.   She reports pain in her right wrist most noticeable right now.     Previous HPI 02/21/24 CLOMA RAHRIG is a 58 y.o. female here for follow up with Sjogren's syndrome seropositive RA characterized by intermittent fever and previous CNS inflammation on hydroxychloroquine  300 mg daily.     She experiences increased pain and weakness in her right hand, particularly in the MCP joint of her index finger, which has worsened to the point where she cannot unscrew bottles or jars. The pain is more severe in the right hand compared to the left, with additional discomfort in the thumb and wrist. She also has pain in her elbows, more on the right than the left.   She recently underwent surgery on her left elbow, which is healing well. She received a cortisone injection in the right elbow, which provided minimal relief.   She experiences pain in her spine, which is most disruptive at night, affecting her sleep. She often sleeps in a recliner to alleviate discomfort, which in turn causes neck pain. She takes gabapentin  at night but reports it no longer provides significant relief. She occasionally uses Ultracet  for severe pain, especially after physical activity [place].   She has a history of weight loss surgery in 2016, which limits her use of NSAIDs. She takes Tylenol  as needed for pain relief. Her weight is currently between 128 and 130 pounds, which is at the lower limit for her current dose of hydroxychloroquine .   No recent significant illnesses or infections. No recent respiratory or gastrointestinal infections. Reports pain in hands, feet, neck, and knees, with nighttime pain affecting sleep.         Previous  HPI 08/23/2023 BUELAH RENNIE is a 58 y.o. female here for follow up with Sjogren's syndrome seropositive RA characterized by intermittent fever and previous CNS inflammation on hydroxychloroquine  300 mg daily.  She presents with increased joint pain in the hands and elbows. They report that the pain has been severe enough to disrupt sleep and is more noticeable during periods of rest. Both elbows are affected, with the right being more painful than the left. The patient describes the elbows as stiff and painful to rest on surfaces. Upon waking, the patient experiences stiffness in both hands, particularly in the right hand, which requires assistance from the left hand to open and close. The stiffness tends to improve as the patient progresses through the day, but returns if the patient remains still for extended periods.    The patient also reports decreased hand strength, making tasks such as opening soda bottles or jars difficult. However, they deny any associated numbness or increased incidence of dropping items. No visible joint swelling has been observed.   The patient  is currently on hydroxychloroquine  300mg  and takes gabapentin  600mg  at night, as well as trazodone  25mg  to aid sleep. Gabapentin  helps but does not take at night if working the next day due to residual drowsiness. Trazodone  helps but has side effects if taking high dose.   The patient also reports a period of approximately one week to ten days about a month ago when they felt like they were in a 'subacute flare', experiencing increased pain, fatigue, and sleep disturbances. They chose to endure this period without seeking medical attention.   The patient is also on Vascepa , a prescription-strength fish oil supplement, and takes a calcium supplement. They have previously used topical diclofenac  for Achilles tendonitis and still have some available for use.      Previous HPI 02/08/2023 GRISELLE RUFER is a 58 y.o. female here for  follow-up with Sjogren's syndrome seropositive RA characterized by intermittent fever and previous CNS inflammation on hydroxychloroquine  300 mg daily.  She has been doing worse for the past 3 to 4 weeks particularly with pain in her neck and upper back.  This pain is most bothersome at nighttime causing difficulty sleeping and some sleep fragmentation.  She was prescribed tizanidine  and Ultracet  for this but with only a limited response to the treatment.  In addition to the gabapentin  600 mg at night which she has been on previously.  Did not recall any specific injury or activity change to account for increased symptoms.  Does not have widespread increase of symptoms and no recurrence of fever or neurologic symptoms.  She does feel her fatigue has been worse but not sure if this is related to the sleep disruption.   Labs reviewed 11/09/22 ELLIOTTE MARSALIS is a 58 y.o. female here for follow up for Sjogren syndrome and seropositive RA with intermittent fever and previous CNS inflammation after starting hydroxychloroquine  200 mg daily.  She had another flareup between Christmas and New Year's pretty debilitating spent the majority of 4 days in bed.  She only had low fevers during this time but also severe fatigue.  She is having fairly diffuse pain at nighttime disrupting her sleep more than half of the nights.  Gabapentin  and low-dose tramadol  have been helpful for sleeping through the night.  May be see some benefit with muscle relaxer medication but avoid this due to getting a drowsiness or hangover effect into the following day.  Not seeing any obvious peripheral joint swelling.  So far she is not sure whether hydroxychloroquine  is making much difference although the febrile episode was much less severe compared to the that landed her in the hospital last year.   08/25/22 AKEIBA AXELSON is a 58 y.o. female here for follow up for sjogren syndrome and suspected seropositive RA. Lab evaluation at initial visit  showing increased CCP Ab titer but normal inflammatory markers. She continues to experience bilateral hand stiffness and swelling most severe in the morning. Also having pain at the left great toe and schedule to see orthopedic surgery clinic for this. She still feels fatigued similar as before. She had recent ophthalmology follow up and left eye amniotic membrane procedure for dry eye.   JAHLIYAH TRICE is a 58 y.o. female here for evaluation of fevers and pain associated with positive CCP Abs and history of sjogren syndrome. She was recently hospitalized with SIRS and suspected infection but no organism identified also concerned for underlying autoimmune process. She completed a course of doxycycline  for possible tickborne illness. CMV testing was  positive for IgG and IgM. Ferritin low at 11 with findings consistent for IDA.    Symptoms started with severe low back pain then progressed to whole spine and headache and fevers. After one week hospitalization symptoms improved and has returned mostly to baseline. Still has pain in the low back more frequently than before. Notices left hip pain pretty regularly and popping or cracking sensation in her thoracic spine. Stiffness and pain commonly in both feet this lasts for about 1 hour also gets elbows and hands for 30-60 minutes. She takes ibuprofen rarely due to previous bariatric surgery. Takes gabapentin  600 mg at night when she is not working which helps partially.   She has a history of gastric sleeve surgery and has some chronic diarrhea but this was more related to sphincter of Oddi dysfunction before this too. She had 3 left knee surgeries she had meniscal injury there as a teenager. She has a family history of RA in her mother who had deforming disease involving both hands.   Labs reviewed 03/2022 ANA neg RF neg CCP 81 Ferritin 11 Lyme neg Erlichiosis IgM equivocal CMV IgG pos IgM pos   Review of Systems  Constitutional:  Positive for  fatigue.  HENT:  Positive for mouth dryness. Negative for mouth sores.   Eyes:  Positive for dryness.  Respiratory:  Negative for shortness of breath.   Cardiovascular:  Negative for chest pain and palpitations.  Gastrointestinal:  Positive for diarrhea. Negative for blood in stool and constipation.  Endocrine: Negative for increased urination.  Genitourinary:  Negative for involuntary urination.  Musculoskeletal:  Positive for joint pain, joint pain, joint swelling, myalgias, muscle weakness, morning stiffness and myalgias. Negative for gait problem and muscle tenderness.  Skin:  Negative for color change, rash, hair loss and sensitivity to sunlight.  Allergic/Immunologic: Negative for susceptible to infections.  Neurological:  Negative for dizziness and headaches.  Hematological:  Negative for swollen glands.  Psychiatric/Behavioral:  Positive for sleep disturbance. Negative for depressed mood. The patient is not nervous/anxious.     PMFS History:  Patient Active Problem List   Diagnosis Date Noted   Diarrhea 08/14/2024   Piriformis syndrome of left side 07/20/2024   High risk medication use 04/11/2024   Well adult exam 12/22/2023   Upper back pain 02/08/2023   At increased risk for cardiovascular disease 12/21/2022   Seropositive rheumatoid arthritis (HCC) 07/22/2022   Joint pain 06/02/2022   FUO (fever of unknown origin) 04/19/2022   Sjogren's syndrome 04/03/2022   Rosacea 02/04/2022   Encounter for counseling 02/04/2022   Essential hypertension 09/27/2021   Insomnia secondary to depression with anxiety 09/27/2021   Tick bite 01/25/2021   Statin intolerance 04/12/2019   Sicca syndrome 02/05/2019   Elevated transaminase measurement 11/02/2017   History of high cholesterol 05/27/2016   History of gestational diabetes 05/27/2016   History of acute pancreatitis 05/27/2016   Prediabetes 04/17/2015   Status post laparoscopic sleeve gastrectomy June 2016 03/25/2015    Hyperlipemia 01/02/2015   Dysfunction of sphincter of Oddi 08/16/2013    Past Medical History:  Diagnosis Date   GERD (gastroesophageal reflux disease)    Heartburn    Hyperlipidemia    Hypertension    IBS (irritable bowel syndrome)    PONV (postoperative nausea and vomiting)    severe   Pre-diabetes    Prediabetes    RA (rheumatoid arthritis) (HCC)    Rosacea    Sjogren's syndrome    Sphincter of Oddi dysfunction  Family History  Problem Relation Age of Onset   Hypertension Mother    Hyperlipidemia Mother    Fibromyalgia Mother    Parkinson's disease Mother    Rheum arthritis Mother    Hyperlipidemia Father    Hypertension Father    Parkinson's disease Brother    Mental retardation Brother    Hyperlipidemia Brother    Breast cancer Maternal Grandmother 38   Healthy Daughter    Breast cancer Cousin 45   Colon cancer Neg Hx    Esophageal cancer Neg Hx    Rectal cancer Neg Hx    Stomach cancer Neg Hx    Past Surgical History:  Procedure Laterality Date   ABDOMINOPLASTY  05/25/2012   ARTHRODESIS METATARSALPHALANGEAL JOINT (MTPJ) Left 08/11/2023   Procedure: LEFT HALLUX ARTHRODESIS METATARSALPHALANGEAL JOINT (MTPJ);  Surgeon: Kit Rush, MD;  Location: [facility];  Service: Orthopedics;  Laterality: Left;   BUNIONECTOMY Left 08/11/2023   Procedure: FELICIE EDWARDS;  Surgeon: Kit Rush, MD;  Location: [facility];  Service: Orthopedics;  Laterality: Left;   CESAREAN SECTION  1991, 1998   x2   CHOLECYSTECTOMY  1995   IRRIGATION AND DEBRIDEMENT ABSCESS Right 05/22/2013   Procedure: MINOR INCISION AND DRAINAGE OF ABSCESS;  Surgeon: Arley JONELLE Curia, MD;  Location: [facility];  Service: Orthopedics;  Laterality: Right;   KNEE ARTHROSCOPY Left    x3   LAPAROSCOPIC GASTRIC SLEEVE RESECTION N/A 03/25/2015   Procedure: LAPAROSCOPIC GASTRIC SLEEVE RESECTION UPPER ENDOSCOPY;  Surgeon: Donnice Lunger, MD;  Location: [facility];   Service: General;  Laterality: N/A;   VAGINAL BIRTH AFTER CESAREAN SECTION  1993, 2001   x2   Social History   Social History Narrative   Not on file   Immunization History  Administered Date(s) Administered   Fluzone Influenza virus vaccine,trivalent (IIV3), split virus 07/03/2024   Influenza,inj,Quad PF,6+ Mos 05/27/2016, 06/27/2017, 06/04/2020, 06/02/2022   Influenza-Unspecified 07/02/2013, 07/26/2014, 06/28/2019, 06/21/2023   Janssen (J&J) SARS-COV-2 Vaccination 01/02/2020   Tdap 03/21/2017   Zoster Recombinant(Shingrix) 06/04/2020, 09/16/2020     Objective: Vital Signs: BP 105/68   Pulse 76   Temp 97.9 F (36.6 C)   Resp 16   Ht 5' 1 (1.549 m)   Wt 132 lb (59.9 kg)   BMI 24.94 kg/m    Physical Exam Eyes:     Conjunctiva/sclera: Conjunctivae normal.  Cardiovascular:     Rate and Rhythm: Normal rate and regular rhythm.  Pulmonary:     Effort: Pulmonary effort is normal.     Breath sounds: Normal breath sounds.  Lymphadenopathy:     Cervical: No cervical adenopathy.  Skin:    General: Skin is warm and dry.     Findings: Rash present.  Neurological:     Mental Status: She is alert.  Psychiatric:        Mood and Affect: Mood normal.      Musculoskeletal Exam:  Shoulders full ROM no tenderness or swelling Elbows full ROM no tenderness or swelling Wrists full ROM no tenderness or swelling Fingers full ROM right 2nd MCP tenderness to pressure, no swelling or erythema Left hip lateral tenderness to pressure Knees full ROM no tenderness or swelling Ankles full ROM no tenderness or swelling   Investigation: No additional findings.  Imaging: No results found.  Recent Labs: Lab Results  Component Value Date   WBC 3.5 (L) 08/14/2024   HGB 12.3 08/14/2024   PLT 276 08/14/2024   NA 141 08/14/2024  K 3.8 08/14/2024   CL 105 08/14/2024   CO2 28 08/14/2024   GLUCOSE 75 08/14/2024   BUN 10 08/14/2024   CREATININE 0.63 08/14/2024   BILITOT 0.3  08/14/2024   ALKPHOS 67 06/05/2024   AST 16 08/14/2024   ALT 10 08/14/2024   PROT 6.7 08/14/2024   ALBUMIN 4.1 06/05/2024   CALCIUM 9.2 08/14/2024   GFRAA 111 03/25/2020   QFTBGOLDPLUS NEGATIVE 02/21/2024    Speciality Comments: Amy Harper O.D. eye exams  Procedures:  No procedures performed Allergies: Patient has no known allergies.   Assessment / Plan:     Visit Diagnoses: Seropositive rheumatoid arthritis (HCC) Improvement with Enbrel , reduced stiffness and pain. Persistent right foot pain with planned surgery. Left hip pain improved. Ultracet  used for pain as NSAIDs not tolerated. - Continue Enbrel  50 mg [place] weekly - Use Ultracet  as needed.  High risk medication use - Enbrel  50 mg  weekly for at least three months to assess full efficacy - Plan: Comprehensive metabolic panel with GFR, CBC with Differential/Platelet No serious interval infections. Main concern would be for the previous mild neutropenia. Not sure about if diarrhea is directly related, not commonly reported and not having other more common Enbrel  effects. - Checking CBC and CMP for medication monitoring on Enbrel   Piriformis syndrome of left side Left hip tendinitis or bursitis Left hip pain improved with heat and rest. Possible gluteal or piriformis tendinitis or trochanteric bursitis. Steroid injection slowed progression. Symptoms expected to resolve in 2-4 weeks.  Neutropenia Slightly low white blood count with absolute neutrophil count of 1.1. Monitoring for further decrease to prevent infection risk.   Orders: Orders Placed This Encounter  Procedures   Comprehensive metabolic panel with GFR   CBC with Differential/Platelet   No orders of the defined types were placed in this encounter.    Follow-Up Instructions: Return in about 3 months (around 11/14/2024) for RA on ENB/tram f/u 3mos.   Lonni LELON Ester, MD  Note - This record has been created using [employer].  Chart creation errors  have been sought, but may not always  have been located. Such creation errors do not reflect on  the standard of medical care.

## 2024-08-02 ENCOUNTER — Encounter (INDEPENDENT_AMBULATORY_CARE_PROVIDER_SITE_OTHER): Payer: Self-pay

## 2024-08-02 ENCOUNTER — Other Ambulatory Visit (HOSPITAL_COMMUNITY): Payer: Self-pay

## 2024-08-02 ENCOUNTER — Other Ambulatory Visit: Payer: Self-pay

## 2024-08-02 ENCOUNTER — Other Ambulatory Visit: Payer: Self-pay | Admitting: Pharmacy Technician

## 2024-08-02 ENCOUNTER — Other Ambulatory Visit (HOSPITAL_BASED_OUTPATIENT_CLINIC_OR_DEPARTMENT_OTHER): Payer: Self-pay

## 2024-08-02 ENCOUNTER — Other Ambulatory Visit: Payer: Self-pay | Admitting: Family Medicine

## 2024-08-02 ENCOUNTER — Other Ambulatory Visit: Payer: Self-pay | Admitting: Internal Medicine

## 2024-08-02 DIAGNOSIS — G479 Sleep disorder, unspecified: Secondary | ICD-10-CM

## 2024-08-02 DIAGNOSIS — M059 Rheumatoid arthritis with rheumatoid factor, unspecified: Secondary | ICD-10-CM

## 2024-08-02 MED ORDER — FINACEA 15 % EX FOAM
1.0000 | Freq: Two times a day (BID) | CUTANEOUS | 0 refills | Status: DC
  Filled 2024-08-02: qty 50, 30d supply, fill #0

## 2024-08-02 MED ORDER — TIZANIDINE HCL 2 MG PO TABS
2.0000 mg | ORAL_TABLET | Freq: Three times a day (TID) | ORAL | 0 refills | Status: DC | PRN
  Filled 2024-08-02: qty 30, 5d supply, fill #0

## 2024-08-02 NOTE — Telephone Encounter (Signed)
 Last Fill: 04/30/2024  UDS: none on file  Narc Agreement: not on file  Next Visit: 08/14/2024  Last Visit: 05/24/2024  Dx: Seropositive rheumatoid arthritis   Current Dose per office note on 05/24/2024: Dose not mentioned   Okay to refill Tramadol ?

## 2024-08-02 NOTE — Progress Notes (Signed)
 Specialty Pharmacy Refill Coordination Note  Kristine Stevens is a 58 y.o. female contacted today regarding refills of specialty medication(s)  Etanercept  (Enbrel  SureClick)    Patient requested (Patient-Rptd) Delivery   Delivery date: 08/14/2024 Verified address: (Patient-Rptd) [address] [place]   Medication will be filled on: 08/13/2024

## 2024-08-03 ENCOUNTER — Other Ambulatory Visit (HOSPITAL_COMMUNITY): Payer: Self-pay

## 2024-08-03 ENCOUNTER — Other Ambulatory Visit: Payer: Self-pay

## 2024-08-03 MED ORDER — TRAMADOL-ACETAMINOPHEN 37.5-325 MG PO TABS
1.0000 | ORAL_TABLET | Freq: Every evening | ORAL | 0 refills | Status: DC | PRN
  Filled 2024-08-03: qty 30, 30d supply, fill #0

## 2024-08-05 ENCOUNTER — Other Ambulatory Visit (HOSPITAL_COMMUNITY): Payer: Self-pay

## 2024-08-13 ENCOUNTER — Other Ambulatory Visit: Payer: Self-pay

## 2024-08-14 ENCOUNTER — Ambulatory Visit: Attending: Internal Medicine | Admitting: Internal Medicine

## 2024-08-14 ENCOUNTER — Encounter: Payer: Self-pay | Admitting: Internal Medicine

## 2024-08-14 VITALS — BP 105/68 | HR 76 | Temp 97.9°F | Resp 16 | Ht 61.0 in | Wt 132.0 lb

## 2024-08-14 DIAGNOSIS — R197 Diarrhea, unspecified: Secondary | ICD-10-CM

## 2024-08-14 DIAGNOSIS — M059 Rheumatoid arthritis with rheumatoid factor, unspecified: Secondary | ICD-10-CM

## 2024-08-14 DIAGNOSIS — Z79899 Other long term (current) drug therapy: Secondary | ICD-10-CM

## 2024-08-14 DIAGNOSIS — G5702 Lesion of sciatic nerve, left lower limb: Secondary | ICD-10-CM

## 2024-08-15 ENCOUNTER — Ambulatory Visit: Payer: Self-pay | Admitting: Internal Medicine

## 2024-08-15 LAB — CBC WITH DIFFERENTIAL/PLATELET
Absolute Lymphocytes: 1617 {cells}/uL (ref 850–3900)
Absolute Monocytes: 343 {cells}/uL (ref 200–950)
Basophils Absolute: 11 {cells}/uL (ref 0–200)
Basophils Relative: 0.3 %
Eosinophils Absolute: 60 {cells}/uL (ref 15–500)
Eosinophils Relative: 1.7 %
HCT: 37.9 % (ref 35.0–45.0)
Hemoglobin: 12.3 g/dL (ref 11.7–15.5)
MCH: 27.8 pg (ref 27.0–33.0)
MCHC: 32.5 g/dL (ref 32.0–36.0)
MCV: 85.6 fL (ref 80.0–100.0)
MPV: 9.4 fL (ref 7.5–12.5)
Monocytes Relative: 9.8 %
Neutro Abs: 1470 {cells}/uL — ABNORMAL LOW (ref 1500–7800)
Neutrophils Relative %: 42 %
Platelets: 276 Thousand/uL (ref 140–400)
RBC: 4.43 Million/uL (ref 3.80–5.10)
RDW: 13.1 % (ref 11.0–15.0)
Total Lymphocyte: 46.2 %
WBC: 3.5 Thousand/uL — ABNORMAL LOW (ref 3.8–10.8)

## 2024-08-15 LAB — COMPREHENSIVE METABOLIC PANEL WITH GFR
AG Ratio: 1.7 (calc) (ref 1.0–2.5)
ALT: 10 U/L (ref 6–29)
AST: 16 U/L (ref 10–35)
Albumin: 4.2 g/dL (ref 3.6–5.1)
Alkaline phosphatase (APISO): 65 U/L (ref 37–153)
BUN: 10 mg/dL (ref 7–25)
CO2: 28 mmol/L (ref 20–32)
Calcium: 9.2 mg/dL (ref 8.6–10.4)
Chloride: 105 mmol/L (ref 98–110)
Creat: 0.63 mg/dL (ref 0.50–1.03)
Globulin: 2.5 g/dL (ref 1.9–3.7)
Glucose, Bld: 75 mg/dL (ref 65–99)
Potassium: 3.8 mmol/L (ref 3.5–5.3)
Sodium: 141 mmol/L (ref 135–146)
Total Bilirubin: 0.3 mg/dL (ref 0.2–1.2)
Total Protein: 6.7 g/dL (ref 6.1–8.1)
eGFR: 103 mL/min/1.73m2 (ref >=60)

## 2024-08-15 NOTE — Progress Notes (Signed)
 Neutropenia improved to 1470 neutrophils which is improved from 1100 2 months ago, likely a response to Enbrel  as discussed. Metabolic panel is normal. We do not need any medication change at this time.

## 2024-08-21 ENCOUNTER — Other Ambulatory Visit (HOSPITAL_COMMUNITY): Payer: Self-pay

## 2024-08-21 MED ORDER — CYCLOSPORINE 0.05 % OP EMUL
1.0000 [drp] | Freq: Two times a day (BID) | OPHTHALMIC | 4 refills | Status: AC
  Filled 2024-08-21 – 2024-09-28 (×3): qty 180, 90d supply, fill #0

## 2024-09-03 ENCOUNTER — Other Ambulatory Visit: Payer: Self-pay

## 2024-09-03 ENCOUNTER — Other Ambulatory Visit (HOSPITAL_COMMUNITY): Payer: Self-pay

## 2024-09-03 ENCOUNTER — Other Ambulatory Visit: Payer: Self-pay | Admitting: Internal Medicine

## 2024-09-03 DIAGNOSIS — Z79899 Other long term (current) drug therapy: Secondary | ICD-10-CM

## 2024-09-03 DIAGNOSIS — M059 Rheumatoid arthritis with rheumatoid factor, unspecified: Secondary | ICD-10-CM

## 2024-09-03 MED ORDER — ENBREL SURECLICK 50 MG/ML ~~LOC~~ SOAJ
50.0000 mg | SUBCUTANEOUS | 0 refills | Status: DC
  Filled 2024-09-03: qty 4, 28d supply, fill #0
  Filled 2024-09-26: qty 4, 28d supply, fill #1
  Filled 2024-10-26: qty 4, 28d supply, fill #2

## 2024-09-03 NOTE — Telephone Encounter (Signed)
 Last Fill: 07/03/2024  Labs: 08/14/2024 Neutropenia improved to 1470 neutrophils which is improved from 1100 2 months ago, likely a response to Enbrel  as discussed. Metabolic panel is normal. We do not need any medication change at this time.   TB Gold: 02/21/2024 Neg    Next Visit: 11/14/2024  Last Visit: 08/14/2024  IK:Dzmnendpupcz rheumatoid arthritis   Current Dose per office note 08/14/2024: Enbrel  50 mg [place] weekly   Okay to refill Enbrel ?

## 2024-09-03 NOTE — Progress Notes (Signed)
 Specialty Pharmacy Refill Coordination Note  MyChart Questionnaire Submission  Kristine Stevens is a 58 y.o. female contacted today regarding refills of specialty medication(s) Enbrel .  Doses on hand: (Patient-Rptd) 1   Injection date: (Patient-Rptd) 09/04/24  Patient requested: (Patient-Rptd) Delivery   Delivery date: 09/07/24  Verified address: [address] [place] [address]  Medication will be filled on 09/06/24.

## 2024-09-04 ENCOUNTER — Other Ambulatory Visit: Payer: Self-pay | Admitting: Family Medicine

## 2024-09-05 ENCOUNTER — Other Ambulatory Visit (HOSPITAL_COMMUNITY): Payer: Self-pay

## 2024-09-05 MED ORDER — OZEMPIC (1 MG/DOSE) 4 MG/3ML ~~LOC~~ SOPN
1.0000 mg | PEN_INJECTOR | SUBCUTANEOUS | 1 refills | Status: DC
  Filled 2024-09-05: qty 3, 28d supply, fill #0
  Filled 2024-09-28: qty 3, 28d supply, fill #1
  Filled 2024-09-28: qty 3, 28d supply, fill #0

## 2024-09-05 MED ORDER — FREESTYLE LIBRE 3 SENSOR MISC
1.0000 | 2 refills | Status: AC
  Filled 2024-09-05: qty 2, 28d supply, fill #0
  Filled 2024-09-10: qty 1, 14d supply, fill #0

## 2024-09-06 ENCOUNTER — Other Ambulatory Visit: Payer: Self-pay

## 2024-09-06 ENCOUNTER — Other Ambulatory Visit (HOSPITAL_COMMUNITY): Payer: Self-pay

## 2024-09-10 ENCOUNTER — Other Ambulatory Visit (HOSPITAL_COMMUNITY): Payer: Self-pay

## 2024-09-14 ENCOUNTER — Ambulatory Visit (INDEPENDENT_AMBULATORY_CARE_PROVIDER_SITE_OTHER)

## 2024-09-14 DIAGNOSIS — Z1231 Encounter for screening mammogram for malignant neoplasm of breast: Secondary | ICD-10-CM

## 2024-09-26 ENCOUNTER — Other Ambulatory Visit (HOSPITAL_COMMUNITY): Payer: Self-pay

## 2024-09-26 ENCOUNTER — Telehealth: Payer: Self-pay

## 2024-09-26 NOTE — Telephone Encounter (Signed)
 Patient contacted the office regarding her Enbrel  injection. Patient states she injected her Enbrel  and did not receive all of her medication. Patient states it did puncture the skin but most of the medication was on top of her skin. Patient was advised she will need to wait until her next dose is due to take another injection. Patient asked what may happen from not having her full dose. Patient advised she could potentially flare and to contact the office if she shows any signs or symptoms.

## 2024-09-28 ENCOUNTER — Other Ambulatory Visit (HOSPITAL_COMMUNITY): Payer: Self-pay

## 2024-09-28 ENCOUNTER — Other Ambulatory Visit: Payer: Self-pay

## 2024-09-28 ENCOUNTER — Other Ambulatory Visit: Payer: Self-pay | Admitting: Family Medicine

## 2024-09-28 ENCOUNTER — Other Ambulatory Visit: Payer: Self-pay | Admitting: Internal Medicine

## 2024-09-28 ENCOUNTER — Encounter: Payer: Self-pay | Admitting: Pharmacist

## 2024-09-28 DIAGNOSIS — G479 Sleep disorder, unspecified: Secondary | ICD-10-CM

## 2024-09-28 DIAGNOSIS — M059 Rheumatoid arthritis with rheumatoid factor, unspecified: Secondary | ICD-10-CM

## 2024-09-28 MED ORDER — TIZANIDINE HCL 2 MG PO TABS
2.0000 mg | ORAL_TABLET | Freq: Three times a day (TID) | ORAL | 0 refills | Status: DC | PRN
  Filled 2024-09-28: qty 30, 5d supply, fill #0

## 2024-09-28 MED ORDER — FINACEA 15 % EX FOAM
1.0000 | Freq: Two times a day (BID) | CUTANEOUS | 0 refills | Status: DC
  Filled 2024-09-28: qty 50, 30d supply, fill #0

## 2024-09-28 NOTE — Telephone Encounter (Signed)
 Last Fill: 08/03/2024  UDS:06/05/2024  negative  Narc Agreement: not on file  Next Visit: 11/14/2024  Last Visit: 08/14/2024  Dx: Seropositive rheumatoid arthritis   Current Dose per office note on 08/14/2024:not mentioned  Okay to refill tramadol ?

## 2024-09-28 NOTE — Progress Notes (Signed)
 Specialty Pharmacy Refill Coordination Note  Kristine Stevens is a 59 y.o. female contacted today regarding refills of specialty medication(s) Etanercept  (Enbrel  SureClick)   Patient requested Delivery   Delivery date: 10/04/24   Verified address: [address] [place] [address]   Medication will be filled on: 10/03/24

## 2024-10-01 ENCOUNTER — Other Ambulatory Visit (HOSPITAL_COMMUNITY): Payer: Self-pay

## 2024-10-01 MED ORDER — TRAMADOL-ACETAMINOPHEN 37.5-325 MG PO TABS
1.0000 | ORAL_TABLET | Freq: Every evening | ORAL | 0 refills | Status: DC | PRN
  Filled 2024-10-01: qty 30, 30d supply, fill #0

## 2024-10-01 NOTE — Telephone Encounter (Signed)
 Thanks for the update. I agree with that recommendation.

## 2024-10-02 ENCOUNTER — Other Ambulatory Visit (HOSPITAL_COMMUNITY): Payer: Self-pay

## 2024-10-03 ENCOUNTER — Other Ambulatory Visit: Payer: Self-pay

## 2024-10-19 ENCOUNTER — Encounter (HOSPITAL_BASED_OUTPATIENT_CLINIC_OR_DEPARTMENT_OTHER): Payer: Self-pay | Admitting: Orthopedic Surgery

## 2024-10-19 ENCOUNTER — Other Ambulatory Visit: Payer: Self-pay

## 2024-10-19 ENCOUNTER — Encounter (HOSPITAL_BASED_OUTPATIENT_CLINIC_OR_DEPARTMENT_OTHER)
Admission: RE | Admit: 2024-10-19 | Discharge: 2024-10-19 | Disposition: A | Source: Ambulatory Visit | Attending: Orthopedic Surgery

## 2024-10-19 DIAGNOSIS — Z01810 Encounter for preprocedural cardiovascular examination: Secondary | ICD-10-CM | POA: Diagnosis present

## 2024-10-19 DIAGNOSIS — R7303 Prediabetes: Secondary | ICD-10-CM | POA: Diagnosis not present

## 2024-10-19 DIAGNOSIS — Z01818 Encounter for other preprocedural examination: Secondary | ICD-10-CM | POA: Diagnosis not present

## 2024-10-19 DIAGNOSIS — Z01812 Encounter for preprocedural laboratory examination: Secondary | ICD-10-CM | POA: Diagnosis present

## 2024-10-19 DIAGNOSIS — K219 Gastro-esophageal reflux disease without esophagitis: Secondary | ICD-10-CM

## 2024-10-19 HISTORY — DX: Gastro-esophageal reflux disease without esophagitis: K21.9

## 2024-10-19 LAB — BASIC METABOLIC PANEL WITH GFR
Anion gap: 9 (ref 5–15)
BUN: 9 mg/dL (ref 6–20)
CO2: 26 mmol/L (ref 22–32)
Calcium: 8.9 mg/dL (ref 8.9–10.3)
Chloride: 103 mmol/L (ref 98–111)
Creatinine, Ser: 0.71 mg/dL (ref 0.44–1.00)
GFR, Estimated: >60 mL/min
Glucose, Bld: 96 mg/dL (ref 70–99)
Potassium: 4.6 mmol/L (ref 3.5–5.1)
Sodium: 139 mmol/L (ref 135–145)

## 2024-10-19 NOTE — Progress Notes (Signed)

## 2024-10-24 ENCOUNTER — Other Ambulatory Visit: Payer: Self-pay

## 2024-10-26 ENCOUNTER — Other Ambulatory Visit: Payer: Self-pay

## 2024-10-26 ENCOUNTER — Other Ambulatory Visit: Payer: Self-pay | Admitting: Pharmacy Technician

## 2024-10-26 NOTE — Progress Notes (Unsigned)
 Submitted a Prior Authorization renewal request to [employer] for ENBREL  via CoverMyMeds. Will update once we receive a response.  Key: [id number]

## 2024-10-27 NOTE — Progress Notes (Signed)
 Received notification from [facility] regarding a prior authorization for ENBREL . Authorization has been APPROVED from 10/26/2024 to 10/25/2025. Approval letter sent to scan center.   Authorization # [id number]  Sherry Pennant, PharmD, MPH, BCPS, CPP Clinical Pharmacist

## 2024-10-29 ENCOUNTER — Other Ambulatory Visit: Payer: Self-pay

## 2024-10-29 ENCOUNTER — Other Ambulatory Visit (HOSPITAL_COMMUNITY): Payer: Self-pay

## 2024-11-01 ENCOUNTER — Other Ambulatory Visit (HOSPITAL_COMMUNITY): Payer: Self-pay

## 2024-11-01 ENCOUNTER — Encounter (HOSPITAL_BASED_OUTPATIENT_CLINIC_OR_DEPARTMENT_OTHER)
Admission: RE | Disposition: A | Discharge status: Home / Self Care | Payer: Self-pay | Source: Home / Self Care | Attending: Orthopedic Surgery

## 2024-11-01 ENCOUNTER — Ambulatory Visit (HOSPITAL_BASED_OUTPATIENT_CLINIC_OR_DEPARTMENT_OTHER)
Admission: RE | Admit: 2024-11-01 | Discharge: 2024-11-01 | Disposition: A | Discharge status: Home / Self Care | Source: Home / Self Care | Attending: Orthopedic Surgery | Admitting: Orthopedic Surgery

## 2024-11-01 ENCOUNTER — Encounter (HOSPITAL_BASED_OUTPATIENT_CLINIC_OR_DEPARTMENT_OTHER): Payer: Self-pay | Admitting: Orthopedic Surgery

## 2024-11-01 ENCOUNTER — Other Ambulatory Visit: Payer: Self-pay

## 2024-11-01 ENCOUNTER — Encounter (HOSPITAL_BASED_OUTPATIENT_CLINIC_OR_DEPARTMENT_OTHER): Payer: Self-pay | Admitting: Anesthesiology

## 2024-11-01 ENCOUNTER — Ambulatory Visit (HOSPITAL_BASED_OUTPATIENT_CLINIC_OR_DEPARTMENT_OTHER)

## 2024-11-01 ENCOUNTER — Other Ambulatory Visit: Payer: Self-pay | Admitting: Family Medicine

## 2024-11-01 DIAGNOSIS — R7303 Prediabetes: Secondary | ICD-10-CM

## 2024-11-01 DIAGNOSIS — Z9189 Other specified personal risk factors, not elsewhere classified: Secondary | ICD-10-CM

## 2024-11-01 DIAGNOSIS — Z4789 Encounter for other orthopedic aftercare: Secondary | ICD-10-CM

## 2024-11-01 LAB — GLUCOSE, CAPILLARY: Glucose-Capillary: 95 mg/dL (ref 70–99)

## 2024-11-01 MED ORDER — OXYCODONE HCL 5 MG PO TABS
5.0000 mg | ORAL_TABLET | Freq: Four times a day (QID) | ORAL | 0 refills | Status: AC | PRN
  Filled 2024-11-01: qty 12, 3d supply, fill #0

## 2024-11-01 MED ORDER — ONDANSETRON HCL 4 MG/2ML IJ SOLN
INTRAMUSCULAR | Status: DC | PRN
  Administered 2024-11-01: 4 mg via INTRAVENOUS

## 2024-11-01 MED ORDER — LIDOCAINE 2% (20 MG/ML) 5 ML SYRINGE
INTRAMUSCULAR | Status: AC
  Filled 2024-11-01: qty 5

## 2024-11-01 MED ORDER — VANCOMYCIN HCL 500 MG IV SOLR
INTRAVENOUS | Status: AC
  Filled 2024-11-01: qty 40

## 2024-11-01 MED ORDER — DEXAMETHASONE SOD PHOSPHATE PF 10 MG/ML IJ SOLN
INTRAMUSCULAR | Status: AC
  Filled 2024-11-01: qty 1

## 2024-11-01 MED ORDER — FENTANYL CITRATE (PF) 100 MCG/2ML IJ SOLN: 25.0000 ug | INTRAMUSCULAR | Status: DC | PRN

## 2024-11-01 MED ORDER — PHENYLEPHRINE HCL (PRESSORS) 10 MG/ML IV SOLN
INTRAVENOUS | Status: DC | PRN
  Administered 2024-11-01 (×3): 80 ug via INTRAVENOUS

## 2024-11-01 MED ORDER — OXYCODONE HCL 5 MG PO TABS: 5.0000 mg | ORAL_TABLET | Freq: Once | ORAL | Status: DC | PRN

## 2024-11-01 MED ORDER — LIDOCAINE HCL (CARDIAC) PF 100 MG/5ML IV SOSY
PREFILLED_SYRINGE | INTRAVENOUS | Status: DC | PRN
  Administered 2024-11-01: 70 mg via INTRAVENOUS

## 2024-11-01 MED ORDER — LACTATED RINGERS IV SOLN: INTRAVENOUS | Status: DC

## 2024-11-01 MED ORDER — PHENYLEPHRINE 80 MCG/ML (10ML) SYRINGE FOR IV PUSH (FOR BLOOD PRESSURE SUPPORT)
PREFILLED_SYRINGE | INTRAVENOUS | Status: AC
  Filled 2024-11-01: qty 10

## 2024-11-01 MED ORDER — FENTANYL CITRATE (PF) 100 MCG/2ML IJ SOLN
INTRAMUSCULAR | Status: AC
  Filled 2024-11-01: qty 2

## 2024-11-01 MED ORDER — PROPOFOL 10 MG/ML IV BOLUS
INTRAVENOUS | Status: DC | PRN
  Administered 2024-11-01: 30 mg via INTRAVENOUS
  Administered 2024-11-01: 170 mg via INTRAVENOUS

## 2024-11-01 MED ORDER — PROPOFOL 10 MG/ML IV BOLUS
INTRAVENOUS | Status: AC
  Filled 2024-11-01: qty 20

## 2024-11-01 MED ORDER — EPHEDRINE SULFATE (PRESSORS) 25 MG/5ML IV SOSY
PREFILLED_SYRINGE | INTRAVENOUS | Status: DC | PRN
  Administered 2024-11-01: 10 mg via INTRAVENOUS

## 2024-11-01 MED ORDER — PROPOFOL 500 MG/50ML IV EMUL
INTRAVENOUS | Status: DC | PRN
  Administered 2024-11-01: 150 ug/kg/min via INTRAVENOUS

## 2024-11-01 MED ORDER — MIDAZOLAM HCL (PF) 2 MG/2ML IJ SOLN
2.0000 mg | Freq: Once | INTRAMUSCULAR | Status: AC
  Administered 2024-11-01: 1 mg via INTRAVENOUS

## 2024-11-01 MED ORDER — ONDANSETRON HCL 4 MG/2ML IJ SOLN: 4.0000 mg | Freq: Four times a day (QID) | INTRAMUSCULAR | Status: DC | PRN

## 2024-11-01 MED ORDER — ACETAMINOPHEN 500 MG PO TABS
ORAL_TABLET | ORAL | Status: AC
  Filled 2024-11-01: qty 2

## 2024-11-01 MED ORDER — MIDAZOLAM HCL 5 MG/5ML IJ SOLN
INTRAMUSCULAR | Status: DC | PRN
  Administered 2024-11-01: 2 mg via INTRAVENOUS

## 2024-11-01 MED ORDER — SODIUM CHLORIDE 0.9 % IV SOLN: INTRAVENOUS | Status: DC

## 2024-11-01 MED ORDER — MIDAZOLAM HCL 2 MG/2ML IJ SOLN
INTRAMUSCULAR | Status: AC
  Filled 2024-11-01: qty 2

## 2024-11-01 MED ORDER — CEFAZOLIN SODIUM-DEXTROSE 2-4 GM/100ML-% IV SOLN
2.0000 g | INTRAVENOUS | Status: AC
  Administered 2024-11-01: 2 g via INTRAVENOUS

## 2024-11-01 MED ORDER — EPHEDRINE 5 MG/ML INJ
INTRAVENOUS | Status: AC
  Filled 2024-11-01: qty 5

## 2024-11-01 MED ORDER — OXYCODONE HCL 5 MG/5ML PO SOLN: 5.0000 mg | Freq: Once | ORAL | Status: DC | PRN

## 2024-11-01 MED ORDER — CEFAZOLIN SODIUM-DEXTROSE 2-4 GM/100ML-% IV SOLN
INTRAVENOUS | Status: AC
  Filled 2024-11-01: qty 100

## 2024-11-01 MED ORDER — FENTANYL CITRATE (PF) 100 MCG/2ML IJ SOLN
INTRAMUSCULAR | Status: DC | PRN
  Administered 2024-11-01: 50 ug via INTRAVENOUS

## 2024-11-01 MED ORDER — DEXAMETHASONE SODIUM PHOSPHATE 4 MG/ML IJ SOLN
INTRAMUSCULAR | Status: DC | PRN
  Administered 2024-11-01: 5 mg via INTRAVENOUS

## 2024-11-01 MED ORDER — FENTANYL CITRATE (PF) 100 MCG/2ML IJ SOLN
100.0000 ug | Freq: Once | INTRAMUSCULAR | Status: AC
  Administered 2024-11-01: 50 ug via INTRAVENOUS

## 2024-11-01 MED ORDER — 0.9 % SODIUM CHLORIDE (POUR BTL) OPTIME
TOPICAL | Status: DC | PRN
  Administered 2024-11-01: 1000 mL

## 2024-11-01 MED ORDER — BUPIVACAINE-EPINEPHRINE (PF) 0.5% -1:200000 IJ SOLN
INTRAMUSCULAR | Status: DC | PRN
  Administered 2024-11-01: 30 mL via PERINEURAL

## 2024-11-01 MED ORDER — ACETAMINOPHEN 500 MG PO TABS
1000.0000 mg | ORAL_TABLET | Freq: Once | ORAL | Status: AC
  Administered 2024-11-01: 1000 mg via ORAL

## 2024-11-01 MED ORDER — ONDANSETRON HCL 4 MG/2ML IJ SOLN
INTRAMUSCULAR | Status: AC
  Filled 2024-11-01: qty 2

## 2024-11-01 NOTE — Assessment & Plan Note (Signed)
 Kristine Stevens

## 2024-11-01 NOTE — Op Note (Signed)
 11/01/2024  8:37 AM  PATIENT:  Kristine Stevens  59 y.o. female  PRE-OPERATIVE DIAGNOSIS:  Acquired hallux rigidus of right foot  POST-OPERATIVE DIAGNOSIS:   1.  Acquired hallux rigidus of right foot      2.  Right foot bunion deformity  Procedures:  1.  Right hallux MP joint arthrodesis 2.  Right foot silver bunionectomy 3.  Right foot AP and lateral radiographs  SURGEON:  Norleen Armor, MD  ASSISTANT: Dickey Sales, PA-C  ANESTHESIA:   General, regional  EBL:  minimal   TOURNIQUET:   Total Tourniquet Time Documented: Thigh (Right) - 35 minutes Total: Thigh (Right) - 35 minutes  COMPLICATIONS:  None apparent  DISPOSITION:  Extubated, awake and stable to recovery.  INDICATION FOR PROCEDURE: 59 year old female with past medical history significant for rheumatoid arthritis complains of worsening right forefoot pain.  She has a bunion deformity as well as hallux rigidus.  She has failed nonoperative treatment to date and presents now for hallux MP joint arthrodesis.  The risks and benefits of the alternative treatment options have been discussed in detail.  The patient wishes to proceed with surgery and specifically understands risks of bleeding, infection, nerve damage, blood clots, need for additional surgery, amputation and death.   PROCEDURE IN DETAIL:  After pre operative consent was obtained, and the correct operative site was identified, the patient was brought to the operating room and placed supine on the OR table.  Anesthesia was administered.  Pre-operative antibiotics were administered.  A surgical timeout was taken.  The right lower extremity was prepped and draped in standard sterile fashion with a tourniquet around the thigh.  The extremity was elevated, and the tourniquet was inflated to 250 mmHg.  A longitudinal incision was made over the hallux MP joint.  Dissection was carried sharply down through the subcutaneous tissues.  The dorsal joint capsule was incised and  elevated medially and laterally.  The EHL tendon was protected.  The collateral ligaments were released and the metatarsal head exposed.  A rondure was used to resect the hypertrophic medial eminence in line with the first metatarsal shaft.  A concave reamer was then used to remove the remaining articular cartilage and subchondral bone from the head of the metatarsal.  The base of the proximal phalanx was then exposed.  A convex reamer was used to remove the remaining articular cartilage and subchondral bone.  The wound was irrigated copiously.  A small drill bit was used to perforate both sides of the joint.  The joint was reduced and provisionally pinned.  AP and lateral radiographs confirmed appropriate reduction of the joint.  A simulated weightbearing examination confirmed appropriate alignment of the toe.  The joint was then compressed with a 4 mm Synthes cannulated partially-threaded screw.  A 5 hole one quarter tubular plate from the Synthes mini frag set was then contoured to fit the dorsum of the joint.  The plate was secured proximally with 2 bicortical 2.7 millimeter screws and distally with 2 bicortical 2.7 millimeter screws.  AP and lateral radiographs confirmed appropriate alignment of the toe in appropriate position and length of all hardware.  The wound was irrigated copiously and sprinkled with vancomycin  powder.  Subcutaneous tissues were approximated with Vicryl.  The skin incision was closed with running 3-0 nylon.  Sterile dressings were applied followed by a cam boot.  The tourniquet was released after application of the dressings.  The patient was awakened from anesthesia and transported to the recovery room  in stable condition.   FOLLOW UP PLAN: Weightbearing as tolerated on the right foot in a cam boot.  Follow-up in 2 weeks for suture removal.  Plan 6 weeks weightbearing immobilization.  Hold Enbrel  for 3 weeks.  No indication for DVT prophylaxis in this ambulatory  patient.   RADIOGRAPHS: AP and lateral radiographs of the right foot are obtained intraoperatively.  These show interval arthrodesis of the hallux MP joint and correction of the bunion deformity.  Hardware is appropriately positioned and of the appropriate lengths.   Dickey Sales, PA-C was present and scrubbed for the duration of the operative case. Her assistance was essential in positioning the patient, prepping and draping, gaining and maintaining exposure, performing the operation, closing and dressing the wounds and applying the splint.

## 2024-11-01 NOTE — H&P (Signed)
 Kristine Stevens is an 59 y.o. female.   Chief Complaint: right foot pain HPI: 59 y/o female with PMH of RA c/o worsening right foot pain due to hallux rigidus.  SHe has failed non op treatment and presents today for hallux MPJ arthrodesis.  Past Medical History:  Diagnosis Date   GERD (gastroesophageal reflux disease) 10/19/2024   resolved   Hyperlipidemia    Hypertension    IBS (irritable bowel syndrome)    PONV (postoperative nausea and vomiting)    severe   Pre-diabetes    RA (rheumatoid arthritis) (HCC)    Rosacea    Sjogren's syndrome    Sphincter of Oddi dysfunction     Past Surgical History:  Procedure Laterality Date   ABDOMINOPLASTY  05/25/2012   ARTHRODESIS METATARSALPHALANGEAL JOINT (MTPJ) Left 08/11/2023   Procedure: LEFT HALLUX ARTHRODESIS METATARSALPHALANGEAL JOINT (MTPJ);  Surgeon: Kit Rush, MD;  Location: [facility];  Service: Orthopedics;  Laterality: Left;   BUNIONECTOMY Left 08/11/2023   Procedure: FELICIE EDWARDS;  Surgeon: Kit Rush, MD;  Location: [facility];  Service: Orthopedics;  Laterality: Left;   CESAREAN SECTION  1991, 1998   x2   CHOLECYSTECTOMY  1995   IRRIGATION AND DEBRIDEMENT ABSCESS Right 05/22/2013   Procedure: MINOR INCISION AND DRAINAGE OF ABSCESS;  Surgeon: Arley JONELLE Curia, MD;  Location: [facility];  Service: Orthopedics;  Laterality: Right;   KNEE ARTHROSCOPY Left    x3   LAPAROSCOPIC GASTRIC SLEEVE RESECTION N/A 03/25/2015   Procedure: LAPAROSCOPIC GASTRIC SLEEVE RESECTION UPPER ENDOSCOPY;  Surgeon: Donnice Lunger, MD;  Location: [facility];  Service: General;  Laterality: N/A;   VAGINAL BIRTH AFTER CESAREAN SECTION  1993, 2001   x2    Family History  Problem Relation Age of Onset   Hypertension Mother    Hyperlipidemia Mother    Fibromyalgia Mother    Parkinson's disease Mother    Rheum arthritis Mother    Hyperlipidemia Father    Hypertension Father    Parkinson's disease Brother     Mental retardation Brother    Hyperlipidemia Brother    Breast cancer Maternal Grandmother 40   Healthy Daughter    Breast cancer Cousin 45   Colon cancer Neg Hx    Esophageal cancer Neg Hx    Rectal cancer Neg Hx    Stomach cancer Neg Hx    Social History:  reports that she has never smoked. She has never been exposed to tobacco smoke. She has never used smokeless tobacco. She reports current alcohol use. She reports that she does not use drugs.  Allergies: Allergies[1]  No medications prior to admission.    No results found for this or any previous visit (from the past 48 hours). No results found.  Review of Systems  no recent f/c/n/v/wt loss  Height 5' 1 (1.549 m), weight 60 kg. Physical Exam  Wn wd woman in nad.  A and O.  EOMI.  Resp unlabored.  Right foot TTP over the hallux MPJ.  Skin healthy and intac.t  No lymphadenopathy.  Decresaed ROM at the hallux MPJ.  Pulses are palpable in the foot.  Assessment/Plan Right hallux rigidus - to the OR today for hallux MPJ arthrodesis.  The risks and benefits of the alternative treatment options have been discussed in detail.  The patient wishes to proceed with surgery and specifically understands risks of bleeding, infection, nerve damage, blood clots, need for additional surgery, amputation and death.   Rush Kit, MD  11/01/2024, 6:04 AM       [1] No Known Allergies

## 2024-11-01 NOTE — Transfer of Care (Signed)
 Immediate Anesthesia Transfer of Care Note  Patient: Kristine Stevens  Procedure(s) Performed: Right hallux Metatarsalphalangeal joint arthrodesis (Right: Toe)  Patient Location: PACU  Anesthesia Type:General  Level of Consciousness: awake, alert , and oriented  Airway & Oxygen Therapy: Patient Spontanous Breathing and Patient connected to face mask oxygen  Post-op Assessment: Report given to RN and Post -op Vital signs reviewed and stable  Post vital signs: Reviewed and stable  Last Vitals:  Vitals Value Taken Time  BP 86/50 11/01/24 08:37  Temp    Pulse 88 11/01/24 08:40  Resp 10 11/01/24 08:40  SpO2 100 % 11/01/24 08:40  Vitals shown include unfiled device data.  Last Pain:  Vitals:   11/01/24 0635  TempSrc: Tympanic  PainSc: 0-No pain      Patients Stated Pain Goal: 6 (11/01/24 9364)  Complications: No notable events documented.

## 2024-11-01 NOTE — Anesthesia Postprocedure Evaluation (Signed)
"   Anesthesia Post Note  Patient: Kristine Stevens  Procedure(s) Performed: Right hallux Metatarsalphalangeal joint arthrodesis (Right: Toe)     Patient location during evaluation: PACU Anesthesia Type: General and Regional Level of consciousness: awake and alert Pain management: pain level controlled Vital Signs Assessment: post-procedure vital signs reviewed and stable Respiratory status: spontaneous breathing, nonlabored ventilation, respiratory function stable and patient connected to nasal cannula oxygen Cardiovascular status: blood pressure returned to baseline and stable Postop Assessment: no apparent nausea or vomiting Anesthetic complications: no   No notable events documented.  Last Vitals:  Vitals:   11/01/24 0915 11/01/24 0935  BP: 107/62 125/67  Pulse: 75 69  Resp: 10 16  Temp:  36.6 C  SpO2: 97% 98%    Last Pain:  Vitals:   11/01/24 0935  TempSrc: Temporal  PainSc: 0-No pain                 Keegan Bensch S      "

## 2024-11-01 NOTE — Progress Notes (Unsigned)
 "  Office Visit Note  Patient: Kristine Stevens             Date of Birth: 1965/11/30           MRN: [id number]             PCP: Alvia Bring, DO Referring: Alvia Bring, DO Visit Date: 11/14/2024   Subjective:  No chief complaint on file.   History of Present Illness: Kristine Stevens is a 59 y.o. female here for follow up with rheumatoid arthritis ron Enbrel  50 mg [place] weekly.   Previous HPI 08/14/2024 Kristine Stevens is a 59 y.o. female here for follow up with rheumatoid arthritis ron Enbrel  50 mg  weekly.    She has experienced improvement in her symptoms since starting Enbrel , although the benefits took some time to manifest. Her hands are less stiff and painful, and while she still experiences morning stiffness, it resolves more quickly and is less achy throughout the day. Her elbow pain has significantly improved, with both elbows feeling much better.   She continues to have issues with her right foot, for which she anticipates surgery in January. Additionally, she experienced a sudden onset of left hip pain, which has improved over two weeks but initially made walking difficult. The pain is now mild and position-dependent, with soreness when sitting or lying on it. She managed the hip pain with heat, a heating pad, and rest for about four days. She also received a steroid injection from her primary care provider, which helped alleviate the symptoms.   No recent illnesses outside of her arthritis symptoms. Her current medication regimen includes Ultracet , which she takes as needed for pain relief, as she cannot tolerate NSAIDs.   A recent lab test showed a slightly low white blood count, specifically a 1.1 absolute neutrophil count. She reports noticing increased diarrhea closer to the enbrel  dose timings. Not associated with abdominal pain, no mucous or blood.        Previous HPI 05/24/2024 Kristine Stevens is a 59 y.o. female here for follow up with rheumatoid arthritis recently  switched to Enbrel  50 mg  weekly.   She has experienced injection site reactions, including swelling and itching, which typically begin a day or so after administration. The reactions have varied in size, with the most recent being the smallest on her lower abdomen. She has completed four doses so far.   Her rheumatoid arthritis pain is constant and affects her quality of life, particularly in her hands and elbows. The pain is not limited to the morning and can occur after periods of inactivity. She has difficulty holding her grandchildren due to the pain. She takes Ultracet  once a day, usually at night, but finds it ineffective. She has stopped taking gabapentin  as it was no longer effective.   She discontinued hydroxychloroquine  due to its effect on her blood sugar levels, which have since stabilized. She has not noticed any visible changes in swelling or redness since stopping the medication.   She had a cold a few weeks ago but recovered quickly.. She experiences pain in her fourth finger and has difficulty closing her index finger at times.       Previous HPI 04/11/2024 Kristine Stevens is a 59 year old female with rheumatoid arthritis on hydroxychloroquine  300 mg daily who presents with worsening joint pain and medication side effects after starting methotrexate .   She experiences worsening joint pain, particularly in her hands, elbows, and spine, which significantly  affects her ability to work, especially during night shifts when her elbows ache considerably.   She has been on methotrexate  but experiences increased fatigue and malaise with each dose. The medication has not improved her symptoms despite several doses, and she has not taken her injection since last Thursday due to running out of medication and uncertainty about continuing it. She reports low energy levels and a diminished quality of life, stating that she has to plan her schedule around feeling unwell after taking methotrexate .    She mentions a low fasting glucose level of 40, with some symptoms of more low energy no specific sweats, tremors, or neurologic effects. She frequently experiences hypoglycemia, with her glucose monitor often alerting her to very low blood sugar levels. She has been on a stable dose of 1 mg of Ozempic  for over six months.   She reports pain in her right wrist most noticeable right now.     Previous HPI 02/21/24 Kristine Stevens is a 59 y.o. female here for follow up with Sjogren's syndrome seropositive RA characterized by intermittent fever and previous CNS inflammation on hydroxychloroquine  300 mg daily.     She experiences increased pain and weakness in her right hand, particularly in the MCP joint of her index finger, which has worsened to the point where she cannot unscrew bottles or jars. The pain is more severe in the right hand compared to the left, with additional discomfort in the thumb and wrist. She also has pain in her elbows, more on the right than the left.   She recently underwent surgery on her left elbow, which is healing well. She received a cortisone injection in the right elbow, which provided minimal relief.   She experiences pain in her spine, which is most disruptive at night, affecting her sleep. She often sleeps in a recliner to alleviate discomfort, which in turn causes neck pain. She takes gabapentin  at night but reports it no longer provides significant relief. She occasionally uses Ultracet  for severe pain, especially after physical activity [place] her [place].   She has a history of weight loss surgery in 2016, which limits her use of NSAIDs. She takes Tylenol  as needed for pain relief. Her weight is currently between 128 and 130 pounds, which is at the lower limit for her current dose of hydroxychloroquine .   No recent significant illnesses or infections. No recent respiratory or gastrointestinal infections. Reports pain in hands, feet, neck, and knees, with nighttime pain  affecting sleep.         Previous HPI 08/23/2023 Kristine Stevens is a 59 y.o. female here for follow up with Sjogren's syndrome seropositive RA characterized by intermittent fever and previous CNS inflammation on hydroxychloroquine  300 mg daily.  She presents with increased joint pain in the hands and elbows. They report that the pain has been severe enough to disrupt sleep and is more noticeable during periods of rest. Both elbows are affected, with the right being more painful than the left. The patient describes the elbows as stiff and painful to rest on surfaces. Upon waking, the patient experiences stiffness in both hands, particularly in the right hand, which requires assistance from the left hand to open and close. The stiffness tends to improve as the patient progresses through the day, but returns if the patient remains still for extended periods.    The patient also reports decreased hand strength, making tasks such as opening soda bottles or jars difficult. However, they deny any associated numbness or  increased incidence of dropping items. No visible joint swelling has been observed.   The patient is currently on hydroxychloroquine  300mg  and takes gabapentin  600mg  at night, as well as trazodone  25mg  to aid sleep. Gabapentin  helps but does not take at night if working the next day due to residual drowsiness. Trazodone  helps but has side effects if taking high dose.   The patient also reports a period of approximately one week to ten days about a month ago when they felt like they were in a 'subacute flare', experiencing increased pain, fatigue, and sleep disturbances. They chose to endure this period without seeking medical attention.   The patient is also on Vascepa , a prescription-strength fish oil supplement, and takes a calcium supplement. They have previously used topical diclofenac  for Achilles tendonitis and still have some available for use.      Previous HPI 02/08/2023 DESTENY FREEMAN is a 59 y.o. female here for follow-up with Sjogren's syndrome seropositive RA characterized by intermittent fever and previous CNS inflammation on hydroxychloroquine  300 mg daily.  She has been doing worse for the past 3 to 4 weeks particularly with pain in her neck and upper back.  This pain is most bothersome at nighttime causing difficulty sleeping and some sleep fragmentation.  She was prescribed tizanidine  and Ultracet  for this but with only a limited response to the treatment.  In addition to the gabapentin  600 mg at night which she has been on previously.  Did not recall any specific injury or activity change to account for increased symptoms.  Does not have widespread increase of symptoms and no recurrence of fever or neurologic symptoms.  She does feel her fatigue has been worse but not sure if this is related to the sleep disruption.   Labs reviewed 11/09/22 RENNAE FERRAIOLO is a 59 y.o. female here for follow up for Sjogren syndrome and seropositive RA with intermittent fever and previous CNS inflammation after starting hydroxychloroquine  200 mg daily.  She had another flareup between Christmas and New Year's pretty debilitating spent the majority of 4 days in bed.  She only had low fevers during this time but also severe fatigue.  She is having fairly diffuse pain at nighttime disrupting her sleep more than half of the nights.  Gabapentin  and low-dose tramadol  have been helpful for sleeping through the night.  May be see some benefit with muscle relaxer medication but avoid this due to getting a drowsiness or hangover effect into the following day.  Not seeing any obvious peripheral joint swelling.  So far she is not sure whether hydroxychloroquine  is making much difference although the febrile episode was much less severe compared to the that landed her in the hospital last year.   08/25/22 ORLI DEGRAVE is a 59 y.o. female here for follow up for sjogren syndrome and suspected seropositive  RA. Lab evaluation at initial visit showing increased CCP Ab titer but normal inflammatory markers. She continues to experience bilateral hand stiffness and swelling most severe in the morning. Also having pain at the left great toe and schedule to see orthopedic surgery clinic for this. She still feels fatigued similar as before. She had recent ophthalmology follow up and left eye amniotic membrane procedure for dry eye.   KESHANA KLEMZ is a 59 y.o. female here for evaluation of fevers and pain associated with positive CCP Abs and history of sjogren syndrome. She was recently hospitalized with SIRS and suspected infection but no organism identified also concerned for  underlying autoimmune process. She completed a course of doxycycline  for possible tickborne illness. CMV testing was positive for IgG and IgM. Ferritin low at 11 with findings consistent for IDA.    Symptoms started with severe low back pain then progressed to whole spine and headache and fevers. After one week hospitalization symptoms improved and has returned mostly to baseline. Still has pain in the low back more frequently than before. Notices left hip pain pretty regularly and popping or cracking sensation in her thoracic spine. Stiffness and pain commonly in both feet this lasts for about 1 hour also gets elbows and hands for 30-60 minutes. She takes ibuprofen rarely due to previous bariatric surgery. Takes gabapentin  600 mg at night when she is not working which helps partially.   She has a history of gastric sleeve surgery and has some chronic diarrhea but this was more related to sphincter of Oddi dysfunction before this too. She had 3 left knee surgeries she had meniscal injury there as a teenager. She has a family history of RA in her mother who had deforming disease involving both hands.   Labs reviewed 03/2022 ANA neg RF neg CCP 81 Ferritin 11 Lyme neg Erlichiosis IgM equivocal CMV IgG pos IgM pos   No Rheumatology ROS  completed.   PMFS History:  Patient Active Problem List   Diagnosis Date Noted   Diarrhea 08/14/2024   Piriformis syndrome of left side 07/20/2024   High risk medication use 04/11/2024   Well adult exam 12/22/2023   Upper back pain 02/08/2023   At increased risk for cardiovascular disease 12/21/2022   Seropositive rheumatoid arthritis (HCC) 07/22/2022   Joint pain 06/02/2022   FUO (fever of unknown origin) 04/19/2022   Sjogren's syndrome 04/03/2022   Rosacea 02/04/2022   Encounter for counseling 02/04/2022   Essential hypertension 09/27/2021   Insomnia secondary to depression with anxiety 09/27/2021   Tick bite 01/25/2021   Statin intolerance 04/12/2019   Sicca syndrome 02/05/2019   Elevated transaminase measurement 11/02/2017   History of high cholesterol 05/27/2016   History of gestational diabetes 05/27/2016   History of acute pancreatitis 05/27/2016   Prediabetes 04/17/2015   Status post laparoscopic sleeve gastrectomy June 2016 03/25/2015   Hyperlipemia 01/02/2015   Dysfunction of sphincter of Oddi 08/16/2013    Past Medical History:  Diagnosis Date   GERD (gastroesophageal reflux disease) 10/19/2024   resolved   Hyperlipidemia    Hypertension    IBS (irritable bowel syndrome)    PONV (postoperative nausea and vomiting)    severe   Pre-diabetes    RA (rheumatoid arthritis) (HCC)    Rosacea    Sjogren's syndrome    Sphincter of Oddi dysfunction     Family History  Problem Relation Age of Onset   Hypertension Mother    Hyperlipidemia Mother    Fibromyalgia Mother    Parkinson's disease Mother    Rheum arthritis Mother    Hyperlipidemia Father    Hypertension Father    Parkinson's disease Brother    Mental retardation Brother    Hyperlipidemia Brother    Breast cancer Maternal Grandmother 41   Healthy Daughter    Breast cancer Cousin 45   Colon cancer Neg Hx    Esophageal cancer Neg Hx    Rectal cancer Neg Hx    Stomach cancer Neg Hx    Past  Surgical History:  Procedure Laterality Date   ABDOMINOPLASTY  05/25/2012   ARTHRODESIS METATARSALPHALANGEAL JOINT (MTPJ) Left 08/11/2023   Procedure:  LEFT HALLUX ARTHRODESIS METATARSALPHALANGEAL JOINT (MTPJ);  Surgeon: Kit Rush, MD;  Location: [facility];  Service: Orthopedics;  Laterality: Left;   BUNIONECTOMY Left 08/11/2023   Procedure: FELICIE EDWARDS;  Surgeon: Kit Rush, MD;  Location: [facility];  Service: Orthopedics;  Laterality: Left;   CESAREAN SECTION  1991, 1998   x2   CHOLECYSTECTOMY  1995   IRRIGATION AND DEBRIDEMENT ABSCESS Right 05/22/2013   Procedure: MINOR INCISION AND DRAINAGE OF ABSCESS;  Surgeon: Arley JONELLE Curia, MD;  Location: [facility];  Service: Orthopedics;  Laterality: Right;   KNEE ARTHROSCOPY Left    x3   LAPAROSCOPIC GASTRIC SLEEVE RESECTION N/A 03/25/2015   Procedure: LAPAROSCOPIC GASTRIC SLEEVE RESECTION UPPER ENDOSCOPY;  Surgeon: Donnice Lunger, MD;  Location: [facility];  Service: General;  Laterality: N/A;   VAGINAL BIRTH AFTER CESAREAN SECTION  1993, 2001   x2   Social History   Social History Narrative   Not on file   Immunization History  Administered Date(s) Administered   Fluzone Influenza virus vaccine,trivalent (IIV3), split virus 07/03/2024   Influenza,inj,Quad PF,6+ Mos 05/27/2016, 06/27/2017, 06/04/2020, 06/02/2022   Influenza-Unspecified 07/02/2013, 07/26/2014, 06/28/2019, 06/21/2023   Janssen (J&J) SARS-COV-2 Vaccination 01/02/2020   Tdap 03/21/2017   Zoster Recombinant(Shingrix) 06/04/2020, 09/16/2020     Objective: Vital Signs: There were no vitals taken for this visit.   Physical Exam   Musculoskeletal Exam: ***   Investigation: No additional findings.  Imaging: DG MINI C-ARM IMAGE ONLY Result Date: 11/01/2024 There is no interpretation for this exam.  This order is for images obtained during a surgical procedure.  Please See Surgeries Tab for more information regarding  the procedure.    Recent Labs: Lab Results  Component Value Date   WBC 3.5 (L) 08/14/2024   HGB 12.3 08/14/2024   PLT 276 08/14/2024   NA 139 10/19/2024   K 4.6 10/19/2024   CL 103 10/19/2024   CO2 26 10/19/2024   GLUCOSE 96 10/19/2024   BUN 9 10/19/2024   CREATININE 0.71 10/19/2024   BILITOT 0.3 08/14/2024   ALKPHOS 67 06/05/2024   AST 16 08/14/2024   ALT 10 08/14/2024   PROT 6.7 08/14/2024   ALBUMIN 4.1 06/05/2024   CALCIUM 8.9 10/19/2024   GFRAA 111 03/25/2020   QFTBGOLDPLUS NEGATIVE 02/21/2024    Speciality Comments: Amy Harper O.D. eye exams  Procedures:  No procedures performed Allergies: Patient has no known allergies.   Assessment / Plan:     Visit Diagnoses:  Assessment & Plan Seropositive rheumatoid arthritis (HCC)     High risk medication use     Piriformis syndrome of left side      ***  Follow-Up Instructions: No follow-ups on file.   Munirah Doerner M Kristilyn Coltrane, CMA  Note - This record has been created using Animal nutritionist.  Chart creation errors have been sought, but may not always  have been located. Such creation errors do not reflect on  the standard of medical care. "

## 2024-11-01 NOTE — Discharge Instructions (Addendum)
 Norleen Armor, MD [facility]  Please read the following information regarding your care after surgery.  Medications  You only need a prescription for the narcotic pain medicine (ex. oxycodone , Percocet, Norco).  All of the other medicines listed below are available over the counter. X Aleve 2 pills twice a day for the first 3 days after surgery. X acetominophen (Tylenol ) 650 mg every 4-6 hours as you need for minor to moderate pain X oxycodone  as prescribed for severe pain  Narcotic pain medicine (ex. oxycodone , Percocet, Vicodin) will cause constipation.  To prevent this problem, take the following medicines while you are taking any pain medicine. X docusate sodium  (Colace) 100 mg twice a day X senna (Senokot) 2 tablets twice a day   Weight Bearing X Bear weight only on your operated foot in the cam boot.  Cast / Splint / Dressing X Keep your splint, cast or dressing clean and dry.  Dont put anything (coat hanger, pencil, etc) down inside of it.  If it gets damp, use a hair dryer on the cool setting to dry it.  If it gets soaked, call the office to schedule an appointment for a cast change.  After your dressing, cast or splint is removed in the office; you may shower, but do not soak or scrub the wound.  Allow the water to run over it, and then gently pat it dry.  Swelling It is normal for you to have swelling where you had surgery.  To reduce swelling and pain, keep your toes above your nose for at least 3 days after surgery.  It may be necessary to keep your foot or leg elevated for several weeks.  If it hurts, it should be elevated.  Follow Up Call my office at [phone] when you are discharged from the hospital or surgery center to schedule an appointment to be seen two weeks after surgery.  Call my office at [phone] if you develop a fever >101.5 F, nausea, vomiting, bleeding from the surgical site or severe pain.     Post Anesthesia Home Care  Instructions  Activity: Get plenty of rest for the remainder of the day. A responsible individual must stay with you for 24 hours following the procedure.  For the next 24 hours, DO NOT: -Drive a car -Advertising copywriter -Drink alcoholic beverages -Take any medication unless instructed by your physician -Make any legal decisions or sign important papers.  Meals: Start with liquid foods such as gelatin or soup. Progress to regular foods as tolerated. Avoid greasy, spicy, heavy foods. If nausea and/or vomiting occur, drink only clear liquids until the nausea and/or vomiting subsides. Call your physician if vomiting continues.  Special Instructions/Symptoms: Your throat may feel dry or sore from the anesthesia or the breathing tube placed in your throat during surgery. If this causes discomfort, gargle with warm salt water. The discomfort should disappear within 24 hours.  If you had a scopolamine  patch placed behind your ear for the management of post- operative nausea and/or vomiting:  1. The medication in the patch is effective for 72 hours, after which it should be removed.  Wrap patch in a tissue and discard in the trash. Wash hands thoroughly with soap and water. 2. You may remove the patch earlier than 72 hours if you experience unpleasant side effects which may include dry mouth, dizziness or visual disturbances. 3. Avoid touching the patch. Wash your hands with soap and water after contact with the patch.     Regional Anesthesia  Blocks  1. You may not be able to move or feel the blocked extremity after a regional anesthetic block. This may last may last from 3-48 hours after placement, but it will go away. The length of time depends on the medication injected and your individual response to the medication. As the nerves start to wake up, you may experience tingling as the movement and feeling returns to your extremity. If the numbness and inability to move your extremity has not gone  away after 48 hours, please call your surgeon.   2. The extremity that is blocked will need to be protected until the numbness is gone and the strength has returned. Because you cannot feel it, you will need to take extra care to avoid injury. Because it may be weak, you may have difficulty moving it or using it. You may not know what position it is in without looking at it while the block is in effect.  3. For blocks in the legs and feet, returning to weight bearing and walking needs to be done carefully. You will need to wait until the numbness is entirely gone and the strength has returned. You should be able to move your leg and foot normally before you try and bear weight or walk. You will need someone to be with you when you first try to ensure you do not fall and possibly risk injury.  4. Bruising and tenderness at the needle site are common side effects and will resolve in a few days.  5. Persistent numbness or new problems with movement should be communicated to the surgeon or the [facility] [phone] [facility] [phone]).  *May have Tylenol  today at 12:45pm

## 2024-11-01 NOTE — Assessment & Plan Note (Signed)
 SABRA

## 2024-11-01 NOTE — Anesthesia Preprocedure Evaluation (Signed)
"                                    Anesthesia Evaluation  Patient identified by MRN, date of birth, ID band Patient awake    Reviewed: Allergy  & Precautions, H&P , NPO status , Patient's Chart, lab work & pertinent test results  History of Anesthesia Complications (+) PONV and history of anesthetic complications  Airway Mallampati: II   Neck ROM: full    Dental   Pulmonary neg pulmonary ROS   breath sounds clear to auscultation       Cardiovascular hypertension,  Rhythm:regular Rate:Normal     Neuro/Psych  PSYCHIATRIC DISORDERS Anxiety Depression       GI/Hepatic ,GERD  ,,  Endo/Other    Renal/GU      Musculoskeletal  (+) Arthritis , Rheumatoid disorders,    Abdominal   Peds  Hematology   Anesthesia Other Findings   Reproductive/Obstetrics                              Anesthesia Physical Anesthesia Plan  ASA: 2  Anesthesia Plan: General   Post-op Pain Management: Regional block*   Induction: Intravenous  PONV Risk Score and Plan: 4 or greater and Ondansetron , Dexamethasone , TIVA, Midazolam , Treatment may vary due to age or medical condition and Propofol  infusion  Airway Management Planned: LMA  Additional Equipment:   Intra-op Plan:   Post-operative Plan: Extubation in OR  Informed Consent: I have reviewed the patients History and Physical, chart, labs and discussed the procedure including the risks, benefits and alternatives for the proposed anesthesia with the patient or authorized representative who has indicated his/her understanding and acceptance.     Dental advisory given  Plan Discussed with: CRNA, Anesthesiologist and Surgeon  Anesthesia Plan Comments:         Anesthesia Quick Evaluation  "

## 2024-11-01 NOTE — Anesthesia Procedure Notes (Signed)
 Anesthesia Regional Block: Popliteal block   Pre-Anesthetic Checklist: , timeout performed,  Correct Patient, Correct Site, Correct Laterality,  Correct Procedure, Correct Position, site marked,  Risks and benefits discussed,  Surgical consent,  Pre-op evaluation,  At surgeon's request and post-op pain management  Laterality: Right  Prep: chloraprep       Needles:  Injection technique: Single-shot  Needle Type: Echogenic Stimulator Needle          Additional Needles:   Procedures:, nerve stimulator,,,,,     Nerve Stimulator or Paresthesia:  Response: plantar flexion of foot, 0.45 mA  Additional Responses:   Narrative:  Start time: 11/01/2024 6:55 AM End time: 11/01/2024 7:02 AM Injection made incrementally with aspirations every 5 mL.  Performed by: Personally  Anesthesiologist: Maryclare Cornet, MD  Additional Notes: Functioning IV was confirmed and monitors were applied.  A 90mm 21ga Arrow echogenic stimulator needle was used. Sterile prep and drape,hand hygiene and sterile gloves were used.  Negative aspiration and negative test dose prior to incremental administration of local anesthetic. The patient tolerated the procedure well.  Ultrasound guidance: relevent anatomy identified, needle position confirmed, local anesthetic spread visualized around nerve(s), vascular puncture avoided.  Image printed for medical record.

## 2024-11-01 NOTE — Progress Notes (Signed)
Assisted Dr. Hodierne with right, popliteal, ultrasound guided block. Side rails up, monitors on throughout procedure. See vital signs in flow sheet. Tolerated Procedure well. ?

## 2024-11-01 NOTE — Anesthesia Procedure Notes (Signed)
 Procedure Name: LMA Insertion Date/Time: 11/01/2024 7:39 AM  Performed by: Buster Catheryn SAUNDERS, CRNAPre-anesthesia Checklist: Patient identified, Emergency Drugs available, Suction available and Patient being monitored Patient Re-evaluated:Patient Re-evaluated prior to induction Oxygen Delivery Method: Circle system utilized Preoxygenation: Pre-oxygenation with 100% oxygen Induction Type: IV induction Ventilation: Mask ventilation without difficulty LMA: LMA inserted LMA Size: 3.0 Number of attempts: 1 Placement Confirmation: positive ETCO2 Tube secured with: Tape Dental Injury: Teeth and Oropharynx as per pre-operative assessment

## 2024-11-02 ENCOUNTER — Other Ambulatory Visit: Payer: Self-pay

## 2024-11-02 ENCOUNTER — Other Ambulatory Visit: Payer: Self-pay | Admitting: Internal Medicine

## 2024-11-02 ENCOUNTER — Telehealth: Payer: Self-pay

## 2024-11-02 ENCOUNTER — Encounter (HOSPITAL_BASED_OUTPATIENT_CLINIC_OR_DEPARTMENT_OTHER): Payer: Self-pay | Admitting: Orthopedic Surgery

## 2024-11-02 ENCOUNTER — Other Ambulatory Visit (HOSPITAL_COMMUNITY): Payer: Self-pay

## 2024-11-02 DIAGNOSIS — Z79899 Other long term (current) drug therapy: Secondary | ICD-10-CM

## 2024-11-02 DIAGNOSIS — M059 Rheumatoid arthritis with rheumatoid factor, unspecified: Secondary | ICD-10-CM

## 2024-11-02 MED ORDER — FINACEA 15 % EX FOAM
1.0000 | Freq: Two times a day (BID) | CUTANEOUS | 0 refills | Status: AC
  Filled 2024-11-02: qty 50, 30d supply, fill #0

## 2024-11-02 MED ORDER — OZEMPIC (1 MG/DOSE) 4 MG/3ML ~~LOC~~ SOPN
1.0000 mg | PEN_INJECTOR | SUBCUTANEOUS | 1 refills | Status: AC
  Filled 2024-11-02: qty 3, 28d supply, fill #0

## 2024-11-02 MED ORDER — TIZANIDINE HCL 2 MG PO TABS
2.0000 mg | ORAL_TABLET | Freq: Three times a day (TID) | ORAL | 0 refills | Status: AC | PRN
  Filled 2024-11-02: qty 30, 5d supply, fill #0

## 2024-11-02 NOTE — Telephone Encounter (Signed)
 Contacted patient after receiving a RX for her Enbrel  and it said Discontinued . Patient stated she has foot surgery and has been off for 3 weeks per the Surgeon , and was not sure when she could resume. Patient was advised that with Enbrel  could resume 2 weeks after surgery as long as there is no signs of infection but since her surgeon took her off 3 weeks before she would need to confirm with him when she is cleared to restart the Enbrel . Patient verbalized understanding an needed to reschedule her appointment with Dr Jeannetta got her over to [place]

## 2024-11-14 ENCOUNTER — Ambulatory Visit: Admitting: Internal Medicine

## 2024-11-14 DIAGNOSIS — Z79899 Other long term (current) drug therapy: Secondary | ICD-10-CM

## 2024-11-14 DIAGNOSIS — M059 Rheumatoid arthritis with rheumatoid factor, unspecified: Secondary | ICD-10-CM

## 2024-11-14 DIAGNOSIS — G5702 Lesion of sciatic nerve, left lower limb: Secondary | ICD-10-CM

## 2024-11-22 ENCOUNTER — Ambulatory Visit
